# Patient Record
Sex: Male | Born: 1960 | Race: White | Hispanic: No | Smoking: Never smoker
Health system: Southern US, Community
[De-identification: ages and names within clinical notes are randomized; demographics above are authoritative.]

## PROBLEM LIST (undated history)

## (undated) DIAGNOSIS — Z8619 Personal history of other infectious and parasitic diseases: Secondary | ICD-10-CM

## (undated) DIAGNOSIS — I878 Other specified disorders of veins: Secondary | ICD-10-CM

## (undated) DIAGNOSIS — F329 Major depressive disorder, single episode, unspecified: Secondary | ICD-10-CM

## (undated) DIAGNOSIS — E559 Vitamin D deficiency, unspecified: Secondary | ICD-10-CM

## (undated) DIAGNOSIS — K219 Gastro-esophageal reflux disease without esophagitis: Secondary | ICD-10-CM

## (undated) DIAGNOSIS — E1151 Type 2 diabetes mellitus with diabetic peripheral angiopathy without gangrene: Secondary | ICD-10-CM

## (undated) DIAGNOSIS — Z800 Family history of malignant neoplasm of digestive organs: Secondary | ICD-10-CM

## (undated) DIAGNOSIS — F32A Depression, unspecified: Secondary | ICD-10-CM

## (undated) DIAGNOSIS — I10 Essential (primary) hypertension: Secondary | ICD-10-CM

## (undated) DIAGNOSIS — E785 Hyperlipidemia, unspecified: Secondary | ICD-10-CM

## (undated) DIAGNOSIS — Z9109 Other allergy status, other than to drugs and biological substances: Secondary | ICD-10-CM

## (undated) HISTORY — DX: Gastro-esophageal reflux disease without esophagitis: K21.9

## (undated) HISTORY — DX: Depression, unspecified: F32.A

## (undated) HISTORY — DX: Type 2 diabetes mellitus with diabetic peripheral angiopathy without gangrene: E11.51

## (undated) HISTORY — DX: Hyperlipidemia, unspecified: E78.5

## (undated) HISTORY — PX: HERNIA REPAIR: SHX51

## (undated) HISTORY — DX: Personal history of other infectious and parasitic diseases: Z86.19

## (undated) HISTORY — DX: Other specified disorders of veins: I87.8

## (undated) HISTORY — DX: Essential (primary) hypertension: I10

## (undated) HISTORY — DX: Vitamin D deficiency, unspecified: E55.9

## (undated) HISTORY — DX: Other allergy status, other than to drugs and biological substances: Z91.09

## (undated) HISTORY — DX: Family history of malignant neoplasm of digestive organs: Z80.0

## (undated) HISTORY — DX: Major depressive disorder, single episode, unspecified: F32.9

---

## 2000-11-22 HISTORY — PX: CHOLECYSTECTOMY: SHX55

## 2011-06-06 ENCOUNTER — Emergency Department: Payer: Self-pay | Admitting: Emergency Medicine

## 2014-09-24 LAB — TSH: TSH: 0.81 u[IU]/mL (ref 0.41–5.90)

## 2014-09-24 LAB — HEPATIC FUNCTION PANEL
ALK PHOS: 80 U/L (ref 25–125)
ALT: 17 U/L (ref 10–40)
AST: 20 U/L (ref 14–40)
Bilirubin, Total: 0.6 mg/dL

## 2014-09-24 LAB — LIPID PANEL
Cholesterol: 181 mg/dL (ref 0–200)
HDL: 47 mg/dL (ref 35–70)
LDL CALC: 123 mg/dL
LDl/HDL Ratio: 2.6
Triglycerides: 56 mg/dL (ref 40–160)

## 2014-09-24 LAB — CBC AND DIFFERENTIAL
HCT: 43 % (ref 41–53)
Hemoglobin: 14.1 g/dL (ref 13.5–17.5)
Neutrophils Absolute: 4 /uL
Platelets: 263 10*3/uL (ref 150–399)
WBC: 7.2 10*3/mL

## 2014-09-24 LAB — BASIC METABOLIC PANEL
BUN: 23 mg/dL — AB (ref 4–21)
Creatinine: 0.9 mg/dL (ref 0.6–1.3)
Glucose: 91 mg/dL
Potassium: 3.8 mmol/L (ref 3.4–5.3)
Sodium: 142 mmol/L (ref 137–147)

## 2014-09-24 LAB — HEMOGLOBIN A1C: Hgb A1c MFr Bld: 5.9 % (ref 4.0–6.0)

## 2015-03-11 ENCOUNTER — Encounter: Payer: Self-pay | Admitting: Internal Medicine

## 2015-03-11 ENCOUNTER — Encounter (INDEPENDENT_AMBULATORY_CARE_PROVIDER_SITE_OTHER): Payer: Self-pay

## 2015-03-11 ENCOUNTER — Ambulatory Visit (INDEPENDENT_AMBULATORY_CARE_PROVIDER_SITE_OTHER): Payer: 59 | Admitting: Internal Medicine

## 2015-03-11 VITALS — BP 126/78 | HR 62 | Temp 98.4°F | Ht 70.0 in | Wt 261.4 lb

## 2015-03-11 DIAGNOSIS — I83009 Varicose veins of unspecified lower extremity with ulcer of unspecified site: Secondary | ICD-10-CM

## 2015-03-11 DIAGNOSIS — I10 Essential (primary) hypertension: Secondary | ICD-10-CM | POA: Diagnosis not present

## 2015-03-11 DIAGNOSIS — Z800 Family history of malignant neoplasm of digestive organs: Secondary | ICD-10-CM

## 2015-03-11 DIAGNOSIS — L97909 Non-pressure chronic ulcer of unspecified part of unspecified lower leg with unspecified severity: Secondary | ICD-10-CM

## 2015-03-11 DIAGNOSIS — Z1211 Encounter for screening for malignant neoplasm of colon: Secondary | ICD-10-CM | POA: Diagnosis not present

## 2015-03-11 DIAGNOSIS — E559 Vitamin D deficiency, unspecified: Secondary | ICD-10-CM | POA: Diagnosis not present

## 2015-03-11 DIAGNOSIS — K219 Gastro-esophageal reflux disease without esophagitis: Secondary | ICD-10-CM

## 2015-03-11 DIAGNOSIS — E669 Obesity, unspecified: Secondary | ICD-10-CM

## 2015-03-11 DIAGNOSIS — E1351 Other specified diabetes mellitus with diabetic peripheral angiopathy without gangrene: Secondary | ICD-10-CM | POA: Diagnosis not present

## 2015-03-11 NOTE — Progress Notes (Signed)
Pre visit review using our clinic review tool, if applicable. No additional management support is needed unless otherwise documented below in the visit note. 

## 2015-03-13 ENCOUNTER — Encounter: Payer: Self-pay | Admitting: Internal Medicine

## 2015-03-23 ENCOUNTER — Encounter: Payer: Self-pay | Admitting: Internal Medicine

## 2015-03-23 DIAGNOSIS — K219 Gastro-esophageal reflux disease without esophagitis: Secondary | ICD-10-CM | POA: Insufficient documentation

## 2015-03-23 DIAGNOSIS — E1351 Other specified diabetes mellitus with diabetic peripheral angiopathy without gangrene: Secondary | ICD-10-CM | POA: Insufficient documentation

## 2015-03-23 DIAGNOSIS — L97909 Non-pressure chronic ulcer of unspecified part of unspecified lower leg with unspecified severity: Secondary | ICD-10-CM

## 2015-03-23 DIAGNOSIS — Z6839 Body mass index (BMI) 39.0-39.9, adult: Secondary | ICD-10-CM | POA: Insufficient documentation

## 2015-03-23 DIAGNOSIS — Z800 Family history of malignant neoplasm of digestive organs: Secondary | ICD-10-CM | POA: Insufficient documentation

## 2015-03-23 DIAGNOSIS — E559 Vitamin D deficiency, unspecified: Secondary | ICD-10-CM | POA: Insufficient documentation

## 2015-03-23 DIAGNOSIS — Z6841 Body Mass Index (BMI) 40.0 and over, adult: Secondary | ICD-10-CM | POA: Insufficient documentation

## 2015-03-23 DIAGNOSIS — I83009 Varicose veins of unspecified lower extremity with ulcer of unspecified site: Secondary | ICD-10-CM | POA: Insufficient documentation

## 2015-03-23 DIAGNOSIS — I10 Essential (primary) hypertension: Secondary | ICD-10-CM | POA: Insufficient documentation

## 2015-03-23 NOTE — Progress Notes (Signed)
[name], male   DOB: June 25, 1961, 54 y.o.   MRN: [id number]   Subjective:    [name], male    DOB: June 26, 1961, 54 y.o.   MRN: [id number]  HPI  Patient here to establish care.  Has been followed at [facility]. Reports history of increased stress with being a caregiver for his late wife.  She had MS.  She passed away.  In a new relationship now.  Dating Melina Modena.  One year after his wife passed away, he was diagnosed with diabetes.  States he was not taking care of himself.  He has adjusted his diet.  Lost weight.  Off metformin.  On lisinopril for hypertension.  Takes zantac prn.  Takes claritin prn.  Walks.  Works outside.  Has not had colonoscopy.     Past Medical History  Diagnosis Date  . History of chicken pox   . Depression   . Environmental allergies   . Hypertension   . Vitamin D deficiency   . Venous stasis   . Family history of colon cancer   . GERD (gastroesophageal reflux disease)   . 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  HENT: Negative for congestion and sinus pressure.   Respiratory: Negative for cough, chest tightness and shortness of breath.   Cardiovascular: Positive for leg swelling. Negative for chest pain and palpitations.       Has chronic venostasis changes.    Gastrointestinal: Negative for nausea, vomiting, abdominal pain and diarrhea.  Genitourinary: Negative for dysuria and difficulty urinating.  Musculoskeletal: Positive for back pain (has chronic back pain.  ). Negative for joint swelling.  Skin:       Chronic venous stasis changes.    Neurological: Negative for dizziness, light-headedness and headaches.  Hematological: Negative for adenopathy. Does not bruise/bleed easily.  Psychiatric/Behavioral: Negative for dysphoric mood and agitation.       Objective:     Blood pressure  recheck:  130/78  Physical Exam  Constitutional: He appears well-developed and well-nourished. No distress.  HENT:  Nose: Nose normal.  Mouth/Throat: Oropharynx is clear and moist.  Eyes: Right eye exhibits no discharge. Left eye exhibits no discharge. No scleral icterus.  Neck: Neck supple. No thyromegaly present.  Cardiovascular: Normal rate and regular rhythm.   Pulmonary/Chest: Effort normal and breath sounds normal. No respiratory distress.  Abdominal: Soft. Bowel sounds are normal. There is no tenderness.  Musculoskeletal: He exhibits edema. He exhibits no tenderness.  Venous stasis changes.    Lymphadenopathy:    He has no cervical adenopathy.  Skin: Skin is dry. No erythema.  Psychiatric: He has a normal mood and affect. His behavior is normal.    BP 126/78 mmHg  Pulse 62  Temp(Src) 98.4 F (36.9 C) (Oral)  Ht _0  (1.778 m)  Wt 261 lb 6 oz (118.559 kg)  BMI 37.50 kg/m2  SpO2 96% Wt Readings from Last 3 Encounters:  03/11/15 261 lb 6 oz (118.559 kg)     Lab Results  Component Value Date   WBC 7.2 09/24/2014   HGB 14.1 09/24/2014   HCT 43 09/24/2014   PLT 263 09/24/2014   CHOL 181 09/24/2014   TRIG 56 09/24/2014   HDL 47 09/24/2014   LDLCALC 123 09/24/2014   ALT 17 09/24/2014   AST 20 09/24/2014   NA 142 09/24/2014   K  3.8 09/24/2014   CREATININE 0.9 09/24/2014   BUN 23* 09/24/2014   TSH 0.81 09/24/2014   HGBA1C 5.9 09/24/2014       Assessment & Plan:   Problem List Items Addressed This Visit    Essential hypertension    Blood pressure doing well.  Same medication regimen.  Follow pressures.  Follow metabolic panel.        Relevant Medications   lisinopril (PRINIVIL,ZESTRIL) 20 MG tablet   Family history of colon cancer    Has never had colonoscopy.  Refer to GI for colonoscopy.        GERD (gastroesophageal reflux disease)    No significant problem with reflux.  Follow.  Takes zantac prn.        Relevant Medications   ranitidine  (ZANTAC) 150 MG tablet   Obesity    Continue diet and exercise.  Follow.        Secondary diabetes with peripheral circulatory disorder    Not on medication now.  Has lost eight.  Continue diet and exercise.  Follow met b and a1c.        Relevant Medications   lisinopril (PRINIVIL,ZESTRIL) 20 MG tablet   Venous stasis ulcer    Has chronic venous stasis changes lower extremities.  No ulcers now.  Follow.        Vitamin D deficiency    Documented in history.  Check vitamin D level.         Other Visit Diagnoses    Colon cancer screening    -  Primary    Relevant Orders    Ambulatory referral to Gastroenterology      I spent 30 minutes with the patient and more than 50% of the time was spent in consultation regarding the above.     [name], MD

## 2015-03-23 NOTE — Assessment & Plan Note (Signed)
Has never had colonoscopy.  Refer to GI for colonoscopy.

## 2015-03-23 NOTE — Assessment & Plan Note (Signed)
Blood pressure doing well.  Same medication regimen.  Follow pressures.  Follow metabolic panel.   

## 2015-03-23 NOTE — Assessment & Plan Note (Signed)
Continue diet and exercise.  Follow.  

## 2015-03-23 NOTE — Assessment & Plan Note (Signed)
Documented in history.  Check vitamin D level.

## 2015-03-23 NOTE — Assessment & Plan Note (Signed)
No significant problem with reflux.  Follow.  Takes zantac prn.

## 2015-03-23 NOTE — Assessment & Plan Note (Signed)
Not on medication now.  Has lost eight.  Continue diet and exercise.  Follow met b and a1c.   

## 2015-03-23 NOTE — Assessment & Plan Note (Signed)
Has chronic venous stasis changes lower extremities.  No ulcers now.  Follow.

## 2015-03-29 ENCOUNTER — Encounter: Payer: Self-pay | Admitting: Internal Medicine

## 2015-04-09 ENCOUNTER — Telehealth: Payer: Self-pay | Admitting: Internal Medicine

## 2015-04-09 NOTE — Telephone Encounter (Signed)
Pt returned your call to schedule a colonoscopy. Pt request to be called after 3:30/msn

## 2015-05-05 LAB — CBC AND DIFFERENTIAL
HCT: 39 % — AB (ref 41–53)
HEMOGLOBIN: 12.8 g/dL — AB (ref 13.5–17.5)
Neutrophils Absolute: 4 /uL
PLATELETS: 301 10*3/uL (ref 150–399)
WBC: 6.5 10^3/mL

## 2015-05-05 LAB — BASIC METABOLIC PANEL
BUN: 20 mg/dL (ref 4–21)
CREATININE: 0.7 mg/dL (ref 0.6–1.3)
Glucose: 116 mg/dL
POTASSIUM: 3.7 mmol/L (ref 3.4–5.3)
Sodium: 144 mmol/L (ref 137–147)

## 2015-05-05 LAB — HEMOGLOBIN A1C: HEMOGLOBIN A1C: 5.9 % (ref 4.0–6.0)

## 2015-05-05 LAB — HEPATIC FUNCTION PANEL
ALK PHOS: 77 U/L (ref 25–125)
ALT: 20 U/L (ref 10–40)
AST: 20 U/L (ref 14–40)
Bilirubin, Direct: 0.08 mg/dL (ref 0.01–0.4)
Bilirubin, Total: 0.2 mg/dL

## 2015-05-05 LAB — TSH: TSH: 1.29 u[IU]/mL (ref 0.41–5.90)

## 2015-05-07 ENCOUNTER — Encounter: Payer: Self-pay | Admitting: Internal Medicine

## 2015-05-11 ENCOUNTER — Telehealth: Payer: Self-pay | Admitting: Internal Medicine

## 2015-05-11 NOTE — Telephone Encounter (Signed)
Pt notified of lab results via mychart. 

## 2015-05-29 ENCOUNTER — Encounter: Payer: Self-pay | Admitting: Internal Medicine

## 2015-06-23 ENCOUNTER — Telehealth: Payer: Self-pay | Admitting: *Deleted

## 2015-06-23 MED ORDER — LISINOPRIL 20 MG PO TABS: 20.0000 mg | ORAL_TABLET | Freq: Every day | ORAL | Status: DC

## 2015-06-23 NOTE — Telephone Encounter (Signed)
Pt called requesting Lisinopril refill.  Lest OV 4.19.16.  Appears you have never filled Rx before.  Please advise refill

## 2015-06-23 NOTE — Telephone Encounter (Signed)
Sent in rx for lisinopril #30 wiith 3 refills.

## 2015-06-27 ENCOUNTER — Encounter: Payer: Self-pay | Admitting: *Deleted

## 2015-06-27 ENCOUNTER — Encounter
Admission: RE | Disposition: A | Discharge status: Home / Self Care | Payer: Self-pay | Source: Ambulatory Visit | Attending: Gastroenterology

## 2015-06-27 ENCOUNTER — Ambulatory Visit: Payer: 59 | Admitting: Certified Registered Nurse Anesthetist

## 2015-06-27 ENCOUNTER — Ambulatory Visit
Admission: RE | Admit: 2015-06-27 | Discharge: 2015-06-27 | Disposition: A | Discharge status: Home / Self Care | Payer: 59 | Source: Ambulatory Visit | Attending: Gastroenterology | Admitting: Gastroenterology

## 2015-06-27 DIAGNOSIS — F329 Major depressive disorder, single episode, unspecified: Secondary | ICD-10-CM | POA: Diagnosis not present

## 2015-06-27 DIAGNOSIS — E559 Vitamin D deficiency, unspecified: Secondary | ICD-10-CM | POA: Insufficient documentation

## 2015-06-27 DIAGNOSIS — E1151 Type 2 diabetes mellitus with diabetic peripheral angiopathy without gangrene: Secondary | ICD-10-CM | POA: Insufficient documentation

## 2015-06-27 DIAGNOSIS — Z1211 Encounter for screening for malignant neoplasm of colon: Secondary | ICD-10-CM | POA: Insufficient documentation

## 2015-06-27 DIAGNOSIS — E669 Obesity, unspecified: Secondary | ICD-10-CM | POA: Diagnosis not present

## 2015-06-27 DIAGNOSIS — K5730 Diverticulosis of large intestine without perforation or abscess without bleeding: Secondary | ICD-10-CM | POA: Diagnosis not present

## 2015-06-27 DIAGNOSIS — K219 Gastro-esophageal reflux disease without esophagitis: Secondary | ICD-10-CM | POA: Diagnosis not present

## 2015-06-27 DIAGNOSIS — I10 Essential (primary) hypertension: Secondary | ICD-10-CM | POA: Diagnosis not present

## 2015-06-27 DIAGNOSIS — Z79899 Other long term (current) drug therapy: Secondary | ICD-10-CM | POA: Diagnosis not present

## 2015-06-27 DIAGNOSIS — D122 Benign neoplasm of ascending colon: Secondary | ICD-10-CM | POA: Insufficient documentation

## 2015-06-27 DIAGNOSIS — D125 Benign neoplasm of sigmoid colon: Secondary | ICD-10-CM | POA: Insufficient documentation

## 2015-06-27 DIAGNOSIS — Z800 Family history of malignant neoplasm of digestive organs: Secondary | ICD-10-CM | POA: Insufficient documentation

## 2015-06-27 DIAGNOSIS — Z6834 Body mass index (BMI) 34.0-34.9, adult: Secondary | ICD-10-CM | POA: Diagnosis not present

## 2015-06-27 HISTORY — PX: COLONOSCOPY WITH PROPOFOL: SHX5780

## 2015-06-27 LAB — HM COLONOSCOPY

## 2015-06-27 SURGERY — COLONOSCOPY WITH PROPOFOL
Anesthesia: General

## 2015-06-27 MED ORDER — SODIUM CHLORIDE 0.9 % IV SOLN
INTRAVENOUS | Status: DC
Start: 2015-06-27 — End: 2015-06-27

## 2015-06-27 MED ORDER — PROPOFOL 10 MG/ML IV BOLUS
INTRAVENOUS | Status: DC | PRN
  Administered 2015-06-27: 30 mg via INTRAVENOUS
  Administered 2015-06-27: 40 mg via INTRAVENOUS

## 2015-06-27 MED ORDER — SODIUM CHLORIDE 0.9 % IV SOLN
INTRAVENOUS | Status: DC
  Administered 2015-06-27: 13:00:00 via INTRAVENOUS

## 2015-06-27 MED ORDER — LIDOCAINE HCL (CARDIAC) 20 MG/ML IV SOLN
INTRAVENOUS | Status: DC | PRN
  Administered 2015-06-27: 60 mg via INTRAVENOUS

## 2015-06-27 MED ORDER — PROPOFOL INFUSION 10 MG/ML OPTIME
INTRAVENOUS | Status: DC | PRN
  Administered 2015-06-27: 140 ug/kg/min via INTRAVENOUS

## 2015-06-27 NOTE — Anesthesia Procedure Notes (Signed)
Performed by: Elpidio Thielen Pre-anesthesia Checklist: Patient identified, Suction available, Emergency Drugs available, Patient being monitored and Timeout performed Patient Re-evaluated:Patient Re-evaluated prior to inductionOxygen Delivery Method: Nasal cannula Intubation Type: IV induction       

## 2015-06-27 NOTE — Transfer of Care (Signed)
Immediate Anesthesia Transfer of Care Note  Patient: [name]  Procedure(s) Performed: Procedure(s): COLONOSCOPY WITH PROPOFOL (N/A)  Patient Location: PACU  Anesthesia Type:General  Level of Consciousness: awake, alert  and oriented  Airway & Oxygen Therapy: Patient Spontanous Breathing and Patient connected to nasal cannula oxygen  Post-op Assessment: Report given to RN and Post -op Vital signs reviewed and stable  Post vital signs: Reviewed and stable  Last Vitals:  Filed Vitals:   06/27/15 1424  BP: 136/73  Pulse: 68  Temp: 36.3 C  Resp: 13    Complications: No apparent anesthesia complications

## 2015-06-27 NOTE — H&P (Addendum)
Outpatient short stay form Pre-procedure 06/27/2015 1:23 PM Lollie Sails MD  Primary Physician: Dr. [name]  Reason for visit:  Colonoscopy  History of present illness:  Patient is a 54 year old male presenting for his first screening colonoscopy. There is some family history of colon cancer in secondary relatives but no first degree relatives. He tolerated his prep well. He takes no anticoagulation medications or blood thinners such as aspirin.    Current facility-administered medications:  .  0.9 %  sodium chloride infusion, , Intravenous, Continuous, Lollie Sails, MD .  0.9 %  sodium chloride infusion, , Intravenous, Continuous, Lollie Sails, MD  Prescriptions prior to admission  Medication Sig Dispense Refill Last Dose  . lisinopril (PRINIVIL,ZESTRIL) 20 MG tablet Take 1 tablet (20 mg total) by mouth daily. 30 tablet 3 06/26/2015 at 10:30pm  . loratadine (CLARITIN) 10 MG tablet Take 10 mg by mouth daily as needed for allergies.   Past Month at Unknown time  . ranitidine (ZANTAC) 150 MG tablet Take 150 mg by mouth at bedtime as needed for heartburn.   Past Month at Unknown time     No Known Allergies   Past Medical History  Diagnosis Date  . History of chicken pox   . Depression   . Environmental allergies   . Hypertension   . Vitamin D deficiency   . Venous stasis   . Family history of colon cancer   . GERD (gastroesophageal reflux disease)   . 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    Lollie Sails, MD Gastroenterology 06/27/2015  1:23 PM

## 2015-06-27 NOTE — Anesthesia Postprocedure Evaluation (Signed)
  Anesthesia Post-op Note  Patient: [name]  Procedure(s) Performed: Procedure(s): COLONOSCOPY WITH PROPOFOL (N/A)  Anesthesia type:General  Patient location: PACU  Post pain: Pain level controlled  Post assessment: Post-op Vital signs reviewed, Patient's Cardiovascular Status Stable, Respiratory Function Stable, Patent Airway and No signs of Nausea or vomiting  Post vital signs: Reviewed and stable  Last Vitals:  Filed Vitals:   06/27/15 1424  BP: 136/73  Pulse: 68  Temp: 36.3 C  Resp: 13    Level of consciousness: awake, alert  and patient cooperative  Complications: No apparent anesthesia complications

## 2015-06-27 NOTE — Anesthesia Preprocedure Evaluation (Addendum)
Anesthesia Evaluation  Patient identified by MRN, date of birth, ID band Patient awake    Reviewed: Allergy & Precautions, NPO status , Patient's Chart, lab work & pertinent test results  Airway Mallampati: II  TM Distance: >3 FB Neck ROM: Full    Dental  (+) Teeth Intact   Pulmonary    Pulmonary exam normal       Cardiovascular Exercise Tolerance: Good + Peripheral Vascular Disease Normal cardiovascular exam Diet controlled DM--much improved since he lost weight.   Neuro/Psych    GI/Hepatic   Endo/Other  diabetesDiet controlled.  Renal/GU      Musculoskeletal   Abdominal (+) + obese,  Abdomen: soft.    Peds  Hematology   Anesthesia Other Findings   Reproductive/Obstetrics                            Anesthesia Physical Anesthesia Plan  ASA: III  Anesthesia Plan: General   Post-op Pain Management:    Induction: Intravenous  Airway Management Planned: Nasal Cannula  Additional Equipment:   Intra-op Plan:   Post-operative Plan:   Informed Consent: I have reviewed the patients History and Physical, chart, labs and discussed the procedure including the risks, benefits and alternatives for the proposed anesthesia with the patient or authorized representative who has indicated his/her understanding and acceptance.     Plan Discussed with: CRNA  Anesthesia Plan Comments:         Anesthesia Quick Evaluation

## 2015-06-27 NOTE — Op Note (Signed)
[facility] Gastroenterology Patient Name: [name] Procedure Date: 06/27/2015 12:44 PM MRN: [id number] Account #: [id number] Date of Birth: Apr 13, 1961 Admit Type: Outpatient Age: 54 Room: [facility] ENDO ROOM 3 Gender: Male Note Status: Finalized Procedure:         Colonoscopy Indications:       Screening for colorectal malignant neoplasm, This is the                     patient's first colonoscopy Providers:         Lollie Sails, MD Referring MD:      [name], MD (Referring MD) Medicines:         Monitored Anesthesia Care Complications:     No immediate complications. Procedure:         Pre-Anesthesia Assessment:                    - ASA Grade Assessment: III - A patient with severe                     systemic disease.                    After obtaining informed consent, the colonoscope was                     passed under direct vision. Throughout the procedure, the                     patient's blood pressure, pulse, and oxygen saturations                     were monitored continuously. The Colonoscope was                     introduced through the anus and advanced to the the cecum,                     identified by appendiceal orifice and ileocecal valve. The                     colonoscopy was unusually difficult due to poor bowel                     prep, significant looping and a tortuous colon. Successful                     completion of the procedure was aided by changing the                     patient to a supine position, changing the patient to a                     prone position, using manual pressure, applying abdominal                     pressure and lavage. The patient tolerated the procedure                     well. The quality of the bowel preparation was fair except                     the cecum was poor. Findings:      A few small-mouthed diverticula were found in the sigmoid colon.  Two sessile polyps were found in  the proximal ascending colon. The       polyps were 2 to 3 mm in size. These polyps were removed with a cold       biopsy forceps. Resection and retrieval were complete.      The cecum could not be fully intubated, but otherwise normal from a       distance.      A 1 mm polyp was found in the sigmoid colon. The polyp was flat. The       polyp was removed with a cold biopsy forceps. Resection and retrieval       were complete.      The retroflexed view of the distal rectum and anal verge was normal and       showed no anal or rectal abnormalities.      The digital rectal exam was normal. Impression:        - Diverticulosis in the sigmoid colon.                    - Two 2 to 3 mm polyps in the proximal ascending colon.                     Resected and retrieved.                    - One 1 mm polyp in the sigmoid colon. Resected and                     retrieved.                    - The distal rectum and anal verge are normal on                     retroflexion view. Recommendation:    - Await pathology results.                    - Telephone GI clinic for pathology results in 1 week. Procedure Code(s): --- Professional ---                    [phone], Colonoscopy, flexible; with biopsy, single or                     multiple Diagnosis Code(s): --- Professional ---                    V76.51, Special screening for malignant neoplasms of colon                    211.3, Benign neoplasm of colon                    562.10, Diverticulosis of colon (without mention of                     hemorrhage) CPT copyright 2014 American Medical Association. All rights reserved. The codes documented in this report are preliminary and upon coder review may  be revised to meet current compliance requirements. Lollie Sails, MD 06/27/2015 2:23:51 PM This report has been signed electronically. Number of Addenda: 0 Note Initiated On: 06/27/2015 12:44 PM Scope Withdrawal Time: 0 hours 23 minutes 45 seconds   Total Procedure Duration: 0 hours 38 minutes 24 seconds       [facility]

## 2015-06-28 ENCOUNTER — Encounter: Payer: Self-pay | Admitting: Gastroenterology

## 2015-07-01 ENCOUNTER — Encounter: Payer: Self-pay | Admitting: Internal Medicine

## 2015-07-01 DIAGNOSIS — Z860100 Personal history of colon polyps, unspecified: Secondary | ICD-10-CM | POA: Insufficient documentation

## 2015-07-01 LAB — SURGICAL PATHOLOGY

## 2015-07-07 ENCOUNTER — Encounter: Payer: Self-pay | Admitting: Internal Medicine

## 2015-09-11 ENCOUNTER — Ambulatory Visit (INDEPENDENT_AMBULATORY_CARE_PROVIDER_SITE_OTHER): Payer: 59 | Admitting: Internal Medicine

## 2015-09-11 ENCOUNTER — Encounter: Payer: Self-pay | Admitting: Internal Medicine

## 2015-09-11 VITALS — BP 120/80 | HR 71 | Temp 98.4°F | Resp 18 | Ht 70.25 in | Wt 272.5 lb

## 2015-09-11 DIAGNOSIS — E559 Vitamin D deficiency, unspecified: Secondary | ICD-10-CM

## 2015-09-11 DIAGNOSIS — M545 Low back pain, unspecified: Secondary | ICD-10-CM

## 2015-09-11 DIAGNOSIS — Z86010 Personal history of colonic polyps: Secondary | ICD-10-CM

## 2015-09-11 DIAGNOSIS — R1032 Left lower quadrant pain: Secondary | ICD-10-CM

## 2015-09-11 DIAGNOSIS — Z800 Family history of malignant neoplasm of digestive organs: Secondary | ICD-10-CM

## 2015-09-11 DIAGNOSIS — I10 Essential (primary) hypertension: Secondary | ICD-10-CM

## 2015-09-11 DIAGNOSIS — E1351 Other specified diabetes mellitus with diabetic peripheral angiopathy without gangrene: Secondary | ICD-10-CM

## 2015-09-11 DIAGNOSIS — Z0000 Encounter for general adult medical examination without abnormal findings: Secondary | ICD-10-CM

## 2015-09-11 DIAGNOSIS — E669 Obesity, unspecified: Secondary | ICD-10-CM

## 2015-09-11 DIAGNOSIS — K219 Gastro-esophageal reflux disease without esophagitis: Secondary | ICD-10-CM | POA: Diagnosis not present

## 2015-09-11 DIAGNOSIS — Z23 Encounter for immunization: Secondary | ICD-10-CM | POA: Diagnosis not present

## 2015-09-11 MED ORDER — LISINOPRIL 20 MG PO TABS: 20.0000 mg | ORAL_TABLET | Freq: Every day | ORAL | Status: DC

## 2015-09-11 NOTE — Progress Notes (Signed)
Pre-visit discussion using our clinic review tool. No additional management support is needed unless otherwise documented below in the visit note.  

## 2015-09-11 NOTE — Patient Instructions (Signed)

## 2015-09-11 NOTE — Progress Notes (Signed)
[name], male   DOB: [birth year], 54 y.o.   MRN: [id number]   Subjective:    [name], male    DOB: 26-Oct-1961, 54 y.o.   MRN: [id number]  HPI  Patient with past history of hypertension, venous stasis, GERD and diabetes.  He comes in today to follow up on these issues as well as for a complete physical exam.  Had a recent colonoscopy with polyps removed.  No chest pain or tightness.  Breathing stable.  No increased allergy symptoms.  Does report a feeling that he describes as a pulling sensation - in his upper leg and groin.  Sitting - ok.  Lying ok.  Intermittent.  No urine or bowel change.  We discussed diet and exercise.  Discussed diet adjustment.  Drinks diet Dr Malachi Bonds (2-3 per day).  Also drinks tea.  Discussed the need for water i   Past Medical History  Diagnosis Date  . History of chicken pox   . Depression   . Environmental allergies   . Hypertension   . Vitamin D deficiency   . Venous stasis   . Family history of colon cancer   . GERD (gastroesophageal reflux disease)   . 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 Cholecystectomy  2002  . Hernia repair  1962  . Colonoscopy with propofol N/A 06/27/2015    Procedure: COLONOSCOPY WITH PROPOFOL;  Surgeon: Lollie Sails, MD;  Location: [facility] ENDOSCOPY;  Service: Endoscopy;  Laterality: N/A;   Family History  Problem Relation Age of Onset  . Stomach cancer Mother   . Arthritis Mother   . Diabetes Mother   . Hypertension Mother   . Hyperlipidemia Father   . Heart disease Father   . Diabetes Father   . Hypertension Father   . Diabetes Sister   . Arthritis Maternal Grandmother   . Hyperlipidemia Maternal Grandmother   . Hypertension Maternal Grandmother   . Arthritis Maternal Grandfather   . Hyperlipidemia Maternal Grandfather   . Heart disease  Maternal Grandfather   . Hypertension Maternal Grandfather   . Arthritis Paternal Grandmother   . Breast cancer Paternal Grandmother   . Hypertension Paternal Grandmother   . Arthritis Paternal Grandfather   . Hyperlipidemia Paternal Grandfather   . Heart disease Paternal Grandfather   . Diabetes Paternal Grandfather   . Hypertension Paternal Grandfather    Social History   Social History  . Marital Status: Widowed    Spouse Name: N/A  . Number of Children: N/A  . Years of Education: N/A   Social History Main Topics  . Smoking status: Never Smoker   . Smokeless tobacco: Never Used  . Alcohol Use: 0.0 oz/week    0 Standard drinks or equivalent per week     Comment: 1 drink every 2 months  . Drug Use: No  . Sexual Activity: Not Asked   Other Topics Concern  . None   Social History Narrative    Outpatient Encounter Prescriptions as of 09/11/2015  Medication Sig  . lisinopril (PRINIVIL,ZESTRIL) 20 MG tablet Take 1 tablet (20 mg total) by mouth daily.  Marland Kitchen loratadine (CLARITIN) 10 MG tablet Take 10 mg by mouth daily as needed for allergies.  . ranitidine (ZANTAC) 150 MG tablet Take 150 mg by mouth at bedtime as needed for heartburn.  . [  DISCONTINUED] lisinopril (PRINIVIL,ZESTRIL) 20 MG tablet Take 1 tablet (20 mg total) by mouth daily.   No facility-administered encounter medications on file as of 09/11/2015.    Review of Systems  Constitutional: Negative for fatigue and unexpected weight change.  HENT: Negative for congestion and sinus pressure.   Eyes: Negative for pain and visual disturbance.  Respiratory: Negative for cough, chest tightness and shortness of breath.   Cardiovascular: Negative for chest pain and palpitations.  Gastrointestinal: Negative for nausea, vomiting, abdominal pain and diarrhea.  Genitourinary: Negative for dysuria and difficulty urinating.  Musculoskeletal: Negative for back pain and joint swelling.       Pain into his groin as outlined.      Skin: Negative for color change and rash.  Neurological: Negative for dizziness, light-headedness and headaches.  Hematological: Negative for adenopathy. Does not bruise/bleed easily.  Psychiatric/Behavioral: Negative for dysphoric mood and agitation.       Objective:    Physical Exam  Constitutional: He is oriented to person, place, and time. He appears well-developed and well-nourished. No distress.  HENT:  Head: Normocephalic and atraumatic.  Nose: Nose normal.  Mouth/Throat: Oropharynx is clear and moist. No oropharyngeal exudate.  Eyes: Conjunctivae are normal. Right eye exhibits no discharge. Left eye exhibits no discharge.  Neck: Neck supple. No thyromegaly present.  Cardiovascular: Normal rate and regular rhythm.   Pulmonary/Chest: Effort normal and breath sounds normal. No respiratory distress. He has no wheezes.  Abdominal: Soft. Bowel sounds are normal. There is no tenderness.  Genitourinary:  Normal descended testicles.  No nodules appreciated.  No tenderness.  No pain to palpation over the groin.  Rectal exam - no palpable prostate nodules.  Heme negative.    Musculoskeletal: He exhibits no edema or tenderness.  Lymphadenopathy:    He has no cervical adenopathy.  Neurological: He is alert and oriented to person, place, and time.  Skin: Skin is warm and dry. No rash noted. No erythema.  Psychiatric: He has a normal mood and affect. His behavior is normal.    BP 120/80 mmHg  Pulse 71  Temp(Src) 98.4 F (36.9 C) (Oral)  Resp 18  Ht 5' 10.25" (1.784 m)  Wt 272 lb 8 oz (123.605 kg)  BMI 38.84 kg/m2  SpO2 95% Wt Readings from Last 3 Encounters:  09/11/15 272 lb 8 oz (123.605 kg)  06/27/15 262 lb (118.842 kg)  03/11/15 261 lb 6 oz (118.559 kg)     Lab Results  Component Value Date   WBC 6.5 05/05/2015   HGB 12.8* 05/05/2015   HCT 39* 05/05/2015   PLT 301 05/05/2015   CHOL 181 09/24/2014   TRIG 56 09/24/2014   HDL 47 09/24/2014   LDLCALC 123 09/24/2014    ALT 20 05/05/2015   AST 20 05/05/2015   NA 144 05/05/2015   K 3.7 05/05/2015   CREATININE 0.7 05/05/2015   BUN 20 05/05/2015   TSH 1.29 05/05/2015   HGBA1C 5.9 05/05/2015       Assessment & Plan:   Problem List Items Addressed This Visit    Essential hypertension    Blood pressure under good control.  Continue same medication regimen.  Follow pressures.  Follow metabolic panel.        Relevant Medications   lisinopril (PRINIVIL,ZESTRIL) 20 MG tablet   Family history of colon cancer    Colonoscopy 06/27/15 with tubular adenoma x 2 and hyperplastic polyp removed.  Diverticulosis.        GERD (gastroesophageal reflux disease)  Takes zantac prn.  Follow.  No upper symptoms.        Groin pain    No pain reproducible on exam.  Stretches.  Question if some of his pain is coming from his back.  Check L-S spine xray.  Follow.        History of colonic polyps    Colonoscopy 06/27/15 as outlined.  Recommended f/u colonoscopy in five years.        Obesity    Discussed diet and exercise.  Follow.  Discussed stopping the increased sodas.  Drinking more water.        Secondary diabetes with peripheral circulatory disorder (HCC)    On no medication.  Lost weight.  Continue diet and exercise.  Follow met b and a1c.  Discussed diet and exercise.        Relevant Medications   lisinopril (PRINIVIL,ZESTRIL) 20 MG tablet   Vitamin D deficiency    Vitamin D level checked 04/2015 28.  Vitamin D supplements.  Follow.         Other Visit Diagnoses    Midline low back pain without sciatica    -  Primary    Relevant Orders    DG Lumbar Spine 2-3 Views    Routine general medical examination at a health care facility            [name], MD

## 2015-09-14 ENCOUNTER — Encounter: Payer: Self-pay | Admitting: Internal Medicine

## 2015-09-14 DIAGNOSIS — R1030 Lower abdominal pain, unspecified: Secondary | ICD-10-CM | POA: Insufficient documentation

## 2015-09-14 NOTE — Assessment & Plan Note (Signed)
No pain reproducible on exam.  Stretches.  Question if some of his pain is coming from his back.  Check L-S spine xray.  Follow.

## 2015-09-14 NOTE — Assessment & Plan Note (Signed)
Colonoscopy 06/27/15 as outlined.  Recommended f/u colonoscopy in five years.

## 2015-09-14 NOTE — Assessment & Plan Note (Signed)
Colonoscopy 06/27/15 with tubular adenoma x 2 and hyperplastic polyp removed.  Diverticulosis.

## 2015-09-14 NOTE — Assessment & Plan Note (Signed)
Discussed diet and exercise.  Follow.  Discussed stopping the increased sodas.  Drinking more water.

## 2015-09-14 NOTE — Assessment & Plan Note (Signed)
Vitamin D level checked 04/2015 28.  Vitamin D supplements.  Follow.

## 2015-09-14 NOTE — Assessment & Plan Note (Signed)
Blood pressure under good control.  Continue same medication regimen.  Follow pressures.  Follow metabolic panel.   

## 2015-09-14 NOTE — Assessment & Plan Note (Signed)
On no medication.  Lost weight.  Continue diet and exercise.  Follow met b and a1c.  Discussed diet and exercise.

## 2015-09-14 NOTE — Assessment & Plan Note (Signed)
Takes zantac prn.  Follow.  No upper symptoms.

## 2015-09-16 ENCOUNTER — Ambulatory Visit
Admission: RE | Admit: 2015-09-16 | Discharge: 2015-09-16 | Disposition: A | Discharge status: Home / Self Care | Payer: 59 | Source: Ambulatory Visit | Attending: Internal Medicine | Admitting: Internal Medicine

## 2015-09-16 DIAGNOSIS — M545 Low back pain, unspecified: Secondary | ICD-10-CM

## 2015-09-16 DIAGNOSIS — M47894 Other spondylosis, thoracic region: Secondary | ICD-10-CM | POA: Diagnosis not present

## 2015-09-16 DIAGNOSIS — M47896 Other spondylosis, lumbar region: Secondary | ICD-10-CM | POA: Diagnosis not present

## 2015-09-20 ENCOUNTER — Other Ambulatory Visit: Payer: Self-pay | Admitting: Internal Medicine

## 2015-09-22 LAB — HEPATIC FUNCTION PANEL
ALK PHOS: 78 IU/L (ref 39–117)
ALT: 17 IU/L (ref 0–44)
AST: 17 IU/L (ref 0–40)
Albumin: 3.8 g/dL (ref 3.5–5.5)
Bilirubin Total: 0.3 mg/dL (ref 0.0–1.2)
Bilirubin, Direct: 0.09 mg/dL (ref 0.00–0.40)
Total Protein: 6.9 g/dL (ref 6.0–8.5)

## 2015-09-22 LAB — BASIC METABOLIC PANEL
BUN/Creatinine Ratio: 27 — ABNORMAL HIGH (ref 9–20)
BUN: 22 mg/dL (ref 6–24)
CALCIUM: 9.1 mg/dL (ref 8.7–10.2)
CHLORIDE: 100 mmol/L (ref 97–106)
CO2: 25 mmol/L (ref 18–29)
Creatinine, Ser: 0.82 mg/dL (ref 0.76–1.27)
GFR calc Af Amer: 116 mL/min/{1.73_m2} (ref >59)
GFR calc non Af Amer: 100 mL/min/{1.73_m2} (ref >59)
GLUCOSE: 115 mg/dL — AB (ref 65–99)
POTASSIUM: 3.7 mmol/L (ref 3.5–5.2)
Sodium: 141 mmol/L (ref 136–144)

## 2015-09-22 LAB — MICROALBUMIN / CREATININE URINE RATIO
CREATININE, UR: 52.2 mg/dL
MICROALB/CREAT RATIO: 16.1 mg/g creat (ref 0.0–30.0)
Microalbumin, Urine: 8.4 ug/mL

## 2015-09-22 LAB — LIPID PANEL W/O CHOL/HDL RATIO
Cholesterol, Total: 175 mg/dL (ref 100–199)
HDL: 44 mg/dL (ref >39)
LDL Calculated: 120 mg/dL — ABNORMAL HIGH (ref 0–99)
TRIGLYCERIDES: 57 mg/dL (ref 0–149)
VLDL Cholesterol Cal: 11 mg/dL (ref 5–40)

## 2015-09-22 LAB — FERRITIN: FERRITIN: 86 ng/mL (ref 30–400)

## 2015-09-22 LAB — CBC
Hematocrit: 40.8 % (ref 37.5–51.0)
Hemoglobin: 13.5 g/dL (ref 12.6–17.7)
MCH: 28.4 pg (ref 26.6–33.0)
MCHC: 33.1 g/dL (ref 31.5–35.7)
MCV: 86 fL (ref 79–97)
PLATELETS: 262 10*3/uL (ref 150–379)
RBC: 4.76 x10E6/uL (ref 4.14–5.80)
RDW: 14.2 % (ref 12.3–15.4)
WBC: 6.8 10*3/uL (ref 3.4–10.8)

## 2015-09-22 LAB — VITAMIN B12: Vitamin B-12: 354 pg/mL (ref 211–946)

## 2015-09-22 LAB — HGB A1C W/O EAG: Hgb A1c MFr Bld: 5.9 % — ABNORMAL HIGH (ref 4.8–5.6)

## 2015-09-23 ENCOUNTER — Encounter: Payer: Self-pay | Admitting: Internal Medicine

## 2015-09-29 ENCOUNTER — Encounter: Payer: Self-pay | Admitting: Internal Medicine

## 2015-10-01 NOTE — Telephone Encounter (Signed)
Unread mychart message mailed to patient 

## 2015-11-18 ENCOUNTER — Ambulatory Visit (INDEPENDENT_AMBULATORY_CARE_PROVIDER_SITE_OTHER): Payer: 59 | Admitting: Internal Medicine

## 2015-11-18 ENCOUNTER — Encounter: Payer: Self-pay | Admitting: Internal Medicine

## 2015-11-18 VITALS — BP 134/75 | HR 71 | Temp 98.4°F | Ht 70.0 in | Wt 274.5 lb

## 2015-11-18 DIAGNOSIS — I10 Essential (primary) hypertension: Secondary | ICD-10-CM | POA: Diagnosis not present

## 2015-11-18 DIAGNOSIS — E1351 Other specified diabetes mellitus with diabetic peripheral angiopathy without gangrene: Secondary | ICD-10-CM | POA: Diagnosis not present

## 2015-11-18 DIAGNOSIS — K219 Gastro-esophageal reflux disease without esophagitis: Secondary | ICD-10-CM | POA: Diagnosis not present

## 2015-11-18 DIAGNOSIS — Z86010 Personal history of colonic polyps: Secondary | ICD-10-CM

## 2015-11-18 DIAGNOSIS — I83009 Varicose veins of unspecified lower extremity with ulcer of unspecified site: Secondary | ICD-10-CM | POA: Diagnosis not present

## 2015-11-18 DIAGNOSIS — L97909 Non-pressure chronic ulcer of unspecified part of unspecified lower leg with unspecified severity: Secondary | ICD-10-CM

## 2015-11-18 DIAGNOSIS — R1032 Left lower quadrant pain: Secondary | ICD-10-CM

## 2015-11-18 DIAGNOSIS — E559 Vitamin D deficiency, unspecified: Secondary | ICD-10-CM

## 2015-11-18 DIAGNOSIS — E669 Obesity, unspecified: Secondary | ICD-10-CM

## 2015-11-18 NOTE — Progress Notes (Signed)
Pre visit review using our clinic review tool, if applicable. No additional management support is needed unless otherwise documented below in the visit note. 

## 2015-11-21 ENCOUNTER — Encounter: Payer: Self-pay | Admitting: Internal Medicine

## 2015-11-21 NOTE — Assessment & Plan Note (Signed)
Doing better on decreased zantac.  Follow.

## 2015-11-21 NOTE — Assessment & Plan Note (Signed)
On no medication.  Discussed diet and exercise.  Follow met b and a1c.

## 2015-11-21 NOTE — Assessment & Plan Note (Signed)
Colonoscopy 06/27/15 - as outlined in overview.  Recommended f/u colonoscopy in five years.

## 2015-11-21 NOTE — Assessment & Plan Note (Signed)
Continue vitamin D supplements.  

## 2015-11-21 NOTE — Assessment & Plan Note (Signed)
Stable venous stasis changes - lower extremities.  Discussed compression hose.  Follow.

## 2015-11-21 NOTE — Assessment & Plan Note (Signed)
Resolved.  Continue stretches and exercises.

## 2015-11-21 NOTE — Progress Notes (Signed)
[name], male   DOB: 11-30-1960, 54 y.o.   MRN: [id number]   Subjective:    [name], male    DOB: 01/04/1961, 54 y.o.   MRN: [id number]  HPI  Patient with past history diabetes, GERD and hypertension.  He comes in today to follow up on these issues.  His pain he was experiencing on last visit is better.  Has seen chiropractor.  Helped.  Does exercises.  No chest pain or tightness.  Breathing stable.  No acid reflux.  Has decreased zantac.  Doing well with this.  No abdominal pain or cramping.  Bowels stable.  Handling stress.  Overall feels better.  Discussed diet and exercise.     Past Medical History  Diagnosis Date  . History of chicken pox   . Depression   . Environmental allergies   . Hypertension   . Vitamin D deficiency   . Venous stasis   . Family history of colon cancer   . GERD (gastroesophageal reflux disease)   . 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 Cholecystectomy  2002  . Hernia repair  1962  . Colonoscopy with propofol N/A 06/27/2015    Procedure: COLONOSCOPY WITH PROPOFOL;  Surgeon: Lollie Sails, MD;  Location: [facility] ENDOSCOPY;  Service: Endoscopy;  Laterality: N/A;   Family History  Problem Relation Age of Onset  . Stomach cancer Mother   . Arthritis Mother   . Diabetes Mother   . Hypertension Mother   . Hyperlipidemia Father   . Heart disease Father   . Diabetes Father   . Hypertension Father   . Diabetes Sister   . Arthritis Maternal Grandmother   . Hyperlipidemia Maternal Grandmother   . Hypertension Maternal Grandmother   . Arthritis Maternal Grandfather   . Hyperlipidemia Maternal Grandfather   . Heart disease Maternal Grandfather   . Hypertension Maternal Grandfather   . Arthritis Paternal Grandmother   . Breast cancer Paternal Grandmother   . Hypertension Paternal  Grandmother   . Arthritis Paternal Grandfather   . Hyperlipidemia Paternal Grandfather   . Heart disease Paternal Grandfather   . Diabetes Paternal Grandfather   . Hypertension Paternal Grandfather    Social History   Social History  . Marital Status: Widowed    Spouse Name: N/A  . Number of Children: N/A  . Years of Education: N/A   Social History Main Topics  . Smoking status: Never Smoker   . Smokeless tobacco: Never Used  . Alcohol Use: 0.0 oz/week    0 Standard drinks or equivalent per week     Comment: 1 drink every 2 months  . Drug Use: No  . Sexual Activity: Not Asked   Other Topics Concern  . None   Social History Narrative    Outpatient Encounter Prescriptions as of 11/18/2015  Medication Sig  . lisinopril (PRINIVIL,ZESTRIL) 20 MG tablet Take 1 tablet (20 mg total) by mouth daily.  Marland Kitchen loratadine (CLARITIN) 10 MG tablet Take 10 mg by mouth daily as needed for allergies.  . ranitidine (ZANTAC) 150 MG tablet Take 150 mg by mouth at bedtime as needed for heartburn.   No facility-administered encounter medications on file as of 11/18/2015.    Review of Systems  Constitutional: Negative for appetite change and unexpected weight change.  HENT: Negative for congestion and  sinus pressure.   Eyes: Negative for discharge and redness.  Respiratory: Negative for cough, chest tightness and shortness of breath.   Cardiovascular: Negative for chest pain, palpitations and leg swelling (no increased swelling.).  Gastrointestinal: Negative for nausea, vomiting, abdominal pain and diarrhea.  Genitourinary: Negative for dysuria and difficulty urinating.  Musculoskeletal: Negative for back pain and joint swelling.  Skin: Negative for color change and rash.  Neurological: Negative for dizziness, light-headedness and headaches.  Psychiatric/Behavioral: Negative for dysphoric mood and agitation.       Objective:    Physical Exam  Constitutional: He appears well-developed and  well-nourished. No distress.  HENT:  Nose: Nose normal.  Mouth/Throat: Oropharynx is clear and moist.  Eyes: Conjunctivae are normal. Right eye exhibits no discharge. Left eye exhibits no discharge.  Neck: Neck supple. No thyromegaly present.  Cardiovascular: Normal rate and regular rhythm.   Pulmonary/Chest: Effort normal and breath sounds normal. No respiratory distress.  Abdominal: Soft. Bowel sounds are normal. There is no tenderness.  Musculoskeletal: He exhibits no edema or tenderness.  Lymphadenopathy:    He has no cervical adenopathy.  Skin: No rash noted. No erythema.  Psychiatric: He has a normal mood and affect. His behavior is normal.    BP 134/75 mmHg  Pulse 71  Temp(Src) 98.4 F (36.9 C) (Oral)  Ht _0  (1.778 m)  Wt 274 lb 8 oz (124.512 kg)  BMI 39.39 kg/m2  SpO2 94% Wt Readings from Last 3 Encounters:  11/18/15 274 lb 8 oz (124.512 kg)  09/11/15 272 lb 8 oz (123.605 kg)  06/27/15 262 lb (118.842 kg)     Lab Results  Component Value Date   WBC 6.8 09/20/2015   HGB 12.8* 05/05/2015   HCT 40.8 09/20/2015   PLT 301 05/05/2015   GLUCOSE 115* 09/20/2015   CHOL 175 09/20/2015   TRIG 57 09/20/2015   HDL 44 09/20/2015   LDLCALC 120* 09/20/2015   ALT 17 09/20/2015   AST 17 09/20/2015   NA 141 09/20/2015   K 3.7 09/20/2015   CL 100 09/20/2015   CREATININE 0.82 09/20/2015   BUN 22 09/20/2015   CO2 25 09/20/2015   TSH 1.29 05/05/2015   HGBA1C 5.9* 09/20/2015    Dg Lumbar Spine 2-3 Views  09/17/2015  CLINICAL DATA:  Lower back pain. EXAM: LUMBAR SPINE - 2-3 VIEW COMPARISON:  None. FINDINGS: Multilevel spondylosis noted throughout the visualized spine. Moderate proliferative changes are present at T12-L1 and L1-L2. There is suggestion of a minimal retrolisthesis of L2 on L3 of approximately 4-5 mm. Mild proliferative changes are present at L3-4. Moderate facet hypertrophy present at L4-5 and L5-S1. No acute fracture identified. No bony lesions are seen.  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 There is mild retrolisthesis of L2 on L3 by approximately 4-5 mm. Electronically Signed   By: Aletta Edouard M.D.   On: 09/17/2015 09:02       Assessment & Plan:   Problem List Items Addressed This Visit    Essential hypertension - Primary    Blood pressure under good control.  Continue same medication regimen.  Follow pressures.  Follow metabolic panel.        GERD (gastroesophageal reflux disease)    Doing better on decreased zantac.  Follow.        Groin pain    Resolved.  Continue stretches and exercises.        History of colonic polyps    Colonoscopy 06/27/15 -  as outlined in overview.  Recommended f/u colonoscopy in five years.        Obesity    Diet and exercise.        Secondary diabetes with peripheral circulatory disorder (HCC)    On no medication.  Discussed diet and exercise.  Follow met b and a1c.        Venous stasis ulcer (HCC)    Stable venous stasis changes - lower extremities.  Discussed compression hose.  Follow.        Vitamin D deficiency    Continue vitamin D supplements.            [name], MD

## 2015-11-21 NOTE — Assessment & Plan Note (Signed)
Blood pressure under good control.  Continue same medication regimen.  Follow pressures.  Follow metabolic panel.   

## 2015-11-21 NOTE — Assessment & Plan Note (Signed)
Diet and exercise.   

## 2016-02-07 ENCOUNTER — Other Ambulatory Visit: Payer: Self-pay | Admitting: Internal Medicine

## 2016-02-09 LAB — LIPID PANEL W/O CHOL/HDL RATIO
CHOLESTEROL TOTAL: 180 mg/dL (ref 100–199)
HDL: 44 mg/dL (ref >39)
LDL Calculated: 123 mg/dL — ABNORMAL HIGH (ref 0–99)
TRIGLYCERIDES: 64 mg/dL (ref 0–149)
VLDL Cholesterol Cal: 13 mg/dL (ref 5–40)

## 2016-02-09 LAB — PSA: PROSTATE SPECIFIC AG, SERUM: 0.7 ng/mL (ref 0.0–4.0)

## 2016-02-09 LAB — BASIC METABOLIC PANEL
BUN/Creatinine Ratio: 34 — ABNORMAL HIGH (ref 9–20)
BUN: 24 mg/dL (ref 6–24)
CO2: 25 mmol/L (ref 18–29)
Calcium: 9 mg/dL (ref 8.7–10.2)
Chloride: 101 mmol/L (ref 96–106)
Creatinine, Ser: 0.71 mg/dL — ABNORMAL LOW (ref 0.76–1.27)
GFR calc Af Amer: 123 mL/min/{1.73_m2} (ref >59)
GFR calc non Af Amer: 106 mL/min/{1.73_m2} (ref >59)
GLUCOSE: 111 mg/dL — AB (ref 65–99)
Potassium: 3.7 mmol/L (ref 3.5–5.2)
SODIUM: 140 mmol/L (ref 134–144)

## 2016-02-09 LAB — HEPATIC FUNCTION PANEL
ALT: 17 IU/L (ref 0–44)
AST: 16 IU/L (ref 0–40)
Albumin: 4 g/dL (ref 3.5–5.5)
Alkaline Phosphatase: 71 IU/L (ref 39–117)
BILIRUBIN, DIRECT: 0.09 mg/dL (ref 0.00–0.40)
Bilirubin Total: 0.3 mg/dL (ref 0.0–1.2)
Total Protein: 6.9 g/dL (ref 6.0–8.5)

## 2016-02-09 LAB — HGB A1C W/O EAG: Hgb A1c MFr Bld: 5.8 % — ABNORMAL HIGH (ref 4.8–5.6)

## 2016-02-11 ENCOUNTER — Encounter: Payer: Self-pay | Admitting: Internal Medicine

## 2016-02-16 ENCOUNTER — Ambulatory Visit: Payer: 59 | Admitting: Internal Medicine

## 2016-02-23 ENCOUNTER — Ambulatory Visit (INDEPENDENT_AMBULATORY_CARE_PROVIDER_SITE_OTHER): Payer: BLUE CROSS/BLUE SHIELD | Admitting: Internal Medicine

## 2016-02-23 ENCOUNTER — Ambulatory Visit: Payer: Self-pay | Admitting: Internal Medicine

## 2016-02-23 ENCOUNTER — Encounter: Payer: Self-pay | Admitting: Internal Medicine

## 2016-02-23 VITALS — BP 122/80 | HR 72 | Temp 98.5°F | Resp 18 | Ht 70.0 in | Wt 271.5 lb

## 2016-02-23 DIAGNOSIS — Z860100 Personal history of colon polyps, unspecified: Secondary | ICD-10-CM

## 2016-02-23 DIAGNOSIS — I10 Essential (primary) hypertension: Secondary | ICD-10-CM | POA: Diagnosis not present

## 2016-02-23 DIAGNOSIS — I83009 Varicose veins of unspecified lower extremity with ulcer of unspecified site: Secondary | ICD-10-CM

## 2016-02-23 DIAGNOSIS — E669 Obesity, unspecified: Secondary | ICD-10-CM

## 2016-02-23 DIAGNOSIS — E1351 Other specified diabetes mellitus with diabetic peripheral angiopathy without gangrene: Secondary | ICD-10-CM | POA: Diagnosis not present

## 2016-02-23 DIAGNOSIS — K219 Gastro-esophageal reflux disease without esophagitis: Secondary | ICD-10-CM

## 2016-02-23 DIAGNOSIS — E7800 Pure hypercholesterolemia, unspecified: Secondary | ICD-10-CM

## 2016-02-23 DIAGNOSIS — L97909 Non-pressure chronic ulcer of unspecified part of unspecified lower leg with unspecified severity: Secondary | ICD-10-CM

## 2016-02-23 MED ORDER — TRIAMCINOLONE ACETONIDE 0.1 % EX CREA: 1.0000 "application " | TOPICAL_CREAM | Freq: Two times a day (BID) | CUTANEOUS | Status: DC

## 2016-02-23 NOTE — Progress Notes (Signed)
Pre-visit discussion using our clinic review tool. No additional management support is needed unless otherwise documented below in the visit note.  

## 2016-02-23 NOTE — Progress Notes (Signed)
[name], male   DOB: [birth year], 55 y.o.   MRN: [id number]   Subjective:    [name], male    DOB: [birth year], 55 y.o.   MRN: [id number]  HPI  Patient here for a scheduled follow up.  He reports he is doing well.  No back pain.  Breathing stable.  Discussed diet and exercise.  No chest pain.  No tightness.  No sob.  No acid reflux.  No abdominal pain or cramping.  Bowels stable.  He reports rash lower extremity.  Small patch.  Itches.     Past Medical History  Diagnosis Date  . History of chicken pox   . Depression   . Environmental allergies   . Hypertension   . Vitamin D deficiency   . Venous stasis   . Family history of colon cancer   . GERD (gastroesophageal reflux disease)   . 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 Cholecystectomy  2002  . Hernia repair  1962  . Colonoscopy with propofol N/A 06/27/2015    Procedure: COLONOSCOPY WITH PROPOFOL;  Surgeon: Lollie Sails, MD;  Location: [facility] ENDOSCOPY;  Service: Endoscopy;  Laterality: N/A;   Family History  Problem Relation Age of Onset  . Stomach cancer Mother   . Arthritis Mother   . Diabetes Mother   . Hypertension Mother   . Hyperlipidemia Father   . Heart disease Father   . Diabetes Father   . Hypertension Father   . Diabetes Sister   . Arthritis Maternal Grandmother   . Hyperlipidemia Maternal Grandmother   . Hypertension Maternal Grandmother   . Arthritis Maternal Grandfather   . Hyperlipidemia Maternal Grandfather   . Heart disease Maternal Grandfather   . Hypertension Maternal Grandfather   . Arthritis Paternal Grandmother   . Breast cancer Paternal Grandmother   . Hypertension Paternal Grandmother   . Arthritis Paternal Grandfather   . Hyperlipidemia Paternal Grandfather   . Heart disease Paternal Grandfather   . Diabetes  Paternal Grandfather   . Hypertension Paternal Grandfather    Social History   Social History  . Marital Status: Widowed    Spouse Name: N/A  . Number of Children: N/A  . Years of Education: N/A   Social History Main Topics  . Smoking status: Never Smoker   . Smokeless tobacco: Never Used  . Alcohol Use: 0.0 oz/week    0 Standard drinks or equivalent per week     Comment: 1 drink every 2 months  . Drug Use: No  . Sexual Activity: Not Asked   Other Topics Concern  . None   Social History Narrative    Outpatient Encounter Prescriptions as of 02/23/2016  Medication Sig  . lisinopril (PRINIVIL,ZESTRIL) 20 MG tablet Take 1 tablet (20 mg total) by mouth daily.  Marland Kitchen loratadine (CLARITIN) 10 MG tablet Take 10 mg by mouth daily as needed for allergies.  . ranitidine (ZANTAC) 150 MG tablet Take 150 mg by mouth at bedtime as needed for heartburn.  . triamcinolone cream (KENALOG) 0.1 % Apply 1 application topically 2 (two) times daily.   No facility-administered encounter medications on file as of 02/23/2016.    Review of Systems  Constitutional: Negative for appetite change and unexpected weight change.  HENT: Negative for congestion and sinus pressure.   Respiratory: Negative for  cough, chest tightness and shortness of breath.   Cardiovascular: Positive for leg swelling (stable swelling.  ). Negative for chest pain and palpitations.  Gastrointestinal: Negative for nausea, vomiting, abdominal pain and diarrhea.  Genitourinary: Negative for dysuria and difficulty urinating.  Musculoskeletal: Negative for back pain and joint swelling.  Skin:       Venostasis changes lower extremities.  Rash noticed lower extremity.    Neurological: Negative for dizziness, light-headedness and headaches.  Psychiatric/Behavioral: Negative for dysphoric mood and agitation.       Objective:    Physical Exam  Constitutional: He appears well-developed and well-nourished. No distress.  HENT:  Nose: Nose  normal.  Mouth/Throat: Oropharynx is clear and moist.  Neck: Neck supple. No thyromegaly present.  Cardiovascular: Normal rate and regular rhythm.   Pulmonary/Chest: Effort normal and breath sounds normal. No respiratory distress.  Abdominal: Soft. Bowel sounds are normal. There is no tenderness.  Musculoskeletal: He exhibits no tenderness.  Stable - edema.  Venostasis changes noted - lower extremities.    Lymphadenopathy:    He has no cervical adenopathy.  Skin: Skin is dry. Rash noted.  Psychiatric: He has a normal mood and affect. His behavior is normal.    BP 122/80 mmHg  Pulse 72  Temp(Src) 98.5 F (36.9 C) (Oral)  Resp 18  Ht _0  (1.778 m)  Wt 271 lb 8 oz (123.152 kg)  BMI 38.96 kg/m2  SpO2 95% Wt Readings from Last 3 Encounters:  02/23/16 271 lb 8 oz (123.152 kg)  11/18/15 274 lb 8 oz (124.512 kg)  09/11/15 272 lb 8 oz (123.605 kg)     Lab Results  Component Value Date   WBC 6.8 09/20/2015   HGB 12.8* 05/05/2015   HCT 40.8 09/20/2015   PLT 262 09/20/2015   GLUCOSE 111* 02/07/2016   CHOL 180 02/07/2016   TRIG 64 02/07/2016   HDL 44 02/07/2016   LDLCALC 123* 02/07/2016   ALT 17 02/07/2016   AST 16 02/07/2016   NA 140 02/07/2016   K 3.7 02/07/2016   CL 101 02/07/2016   CREATININE 0.71* 02/07/2016   BUN 24 02/07/2016   CO2 25 02/07/2016   TSH 1.29 05/05/2015   HGBA1C 5.8* 02/07/2016    Dg Lumbar Spine 2-3 Views  09/17/2015  CLINICAL DATA:  Lower back pain. EXAM: LUMBAR SPINE - 2-3 VIEW COMPARISON:  None. FINDINGS: Multilevel spondylosis noted throughout the visualized spine. Moderate proliferative changes are present at T12-L1 and L1-L2. There is suggestion of a minimal retrolisthesis of L2 on L3 of approximately 4-5 mm. Mild proliferative changes are present at L3-4. Moderate facet hypertrophy present at L4-5 and L5-S1. No acute fracture identified. No bony lesions are seen. 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 There  is mild retrolisthesis of L2 on L3 by approximately 4-5 mm. Electronically Signed   By: Aletta Edouard M.D.   On: 09/17/2015 09:02       Assessment & Plan:   Problem List Items Addressed This Visit    Essential hypertension - Primary    Blood pressure under good control.  Continue same medication regimen.  Follow pressures.  Follow metabolic panel.        GERD (gastroesophageal reflux disease)    No upper symptoms reported.        History of colonic polyps    Colonoscopy 06/27/15.  Recommended f/u colonoscopy in five years.       Hypercholesterolemia    Discussed cholesterol.  LDL  123.  Diet and exercise.  Follow lipid panel.        Obesity    Diet and exercise.        Secondary diabetes with peripheral circulatory disorder (HCC)    On no medication.  Low carb diet and exercise.  Follow met b and a1c.  a1c just checked 5.8.        Venous stasis ulcer (HCC)    Stable venostasis changes - lower extremities.  Rash present.  TCC cream as directed.  Compression hose.            [name], MD

## 2016-02-29 ENCOUNTER — Encounter: Payer: Self-pay | Admitting: Internal Medicine

## 2016-02-29 DIAGNOSIS — E7800 Pure hypercholesterolemia, unspecified: Secondary | ICD-10-CM | POA: Insufficient documentation

## 2016-02-29 NOTE — Assessment & Plan Note (Signed)
Blood pressure under good control.  Continue same medication regimen.  Follow pressures.  Follow metabolic panel.   

## 2016-02-29 NOTE — Assessment & Plan Note (Signed)
Diet and exercise.   

## 2016-02-29 NOTE — Assessment & Plan Note (Addendum)
On no medication.  Low carb diet and exercise.  Follow met b and a1c.  a1c just checked 5.8.

## 2016-02-29 NOTE — Assessment & Plan Note (Signed)
Discussed cholesterol.  LDL 123.  Diet and exercise.  Follow lipid panel.

## 2016-02-29 NOTE — Assessment & Plan Note (Signed)
Colonoscopy 06/27/15.  Recommended f/u colonoscopy in five years.

## 2016-02-29 NOTE — Assessment & Plan Note (Signed)
Stable venostasis changes - lower extremities.  Rash present.  TCC cream as directed.  Compression hose.

## 2016-02-29 NOTE — Assessment & Plan Note (Signed)
No upper symptoms reported.   

## 2016-04-06 ENCOUNTER — Encounter: Payer: Self-pay | Admitting: Internal Medicine

## 2016-04-07 NOTE — Telephone Encounter (Signed)
Please call pt and let him know that I think he needs to be seen today.  Recommend acute care today and then can f/u after.  Thanks

## 2016-04-07 NOTE — Telephone Encounter (Signed)
Spoke with you about this.  He needs to be seen today.  Thanks

## 2016-04-07 NOTE — Telephone Encounter (Signed)
Noted.  Please continue to try to call as well.  If unable to reach today - agree with sending my chart, leaving a message to be seen and please check on him tomorrow.   Thanks   Dr Nicki Reaper

## 2016-04-08 NOTE — Telephone Encounter (Signed)
I spoke with him last night, he was not going to go last night to the clinic, was going to go today to [facility] in I advised that he really should be seen Today and he declined.  He will follow up with [place] tomorrow.

## 2016-04-26 ENCOUNTER — Telehealth: Payer: Self-pay | Admitting: Internal Medicine

## 2016-04-26 NOTE — Telephone Encounter (Signed)
Spoke with the patient, he is having a follow up appt with Dr. Nicki Reaper on 05/24/2016.  He normally comes in 2 weeks prior to get lab work done.  He has a script already, but wants to make sure that is all he needs since he had the bleeding episode prior.  I will email the current labs over to Dr. Nicki Reaper as soon as I received the fax from his office. thanks

## 2016-04-26 NOTE — Telephone Encounter (Signed)
Pt would like to talk to Lavella Lemons about some issues that he spoke to her a few weeks ago. Please call pt after 3:30.

## 2016-04-27 NOTE — Telephone Encounter (Signed)
Please advise, thanks.

## 2016-04-27 NOTE — Telephone Encounter (Signed)
I have not seen the form.  Please hold until received.

## 2016-04-27 NOTE — Telephone Encounter (Signed)
Yes and I haven't received it yet, did it end up in your mailbox? He was going to send what he had already ordered.

## 2016-04-27 NOTE — Telephone Encounter (Signed)
Received paperwork and had D.Scott review, I will refax tomorrow, thanks.

## 2016-04-27 NOTE — Telephone Encounter (Signed)
I don't understand the message.  Is he going to send copy of labs (fax from his office?).

## 2016-04-28 NOTE — Telephone Encounter (Signed)
Left message for Patient to return my call 

## 2016-04-28 NOTE — Telephone Encounter (Signed)
Spoke with the patient, he will stop by to let me change the lab sheet he has to reflect the new lab needed. thanks

## 2016-05-06 ENCOUNTER — Other Ambulatory Visit: Payer: Self-pay | Admitting: Internal Medicine

## 2016-05-07 ENCOUNTER — Encounter: Payer: Self-pay | Admitting: Internal Medicine

## 2016-05-07 ENCOUNTER — Telehealth: Payer: Self-pay | Admitting: Internal Medicine

## 2016-05-07 DIAGNOSIS — S81809A Unspecified open wound, unspecified lower leg, initial encounter: Secondary | ICD-10-CM

## 2016-05-07 LAB — BASIC METABOLIC PANEL WITH GFR
BUN/Creatinine Ratio: 29 — ABNORMAL HIGH (ref 9–20)
BUN: 25 mg/dL — AB (ref 4–21)
BUN: 25 mg/dL — ABNORMAL HIGH (ref 6–24)
CO2: 24 mmol/L (ref 18–29)
Calcium: 9 mg/dL (ref 8.7–10.2)
Chloride: 101 mmol/L (ref 96–106)
Creatinine, Ser: 0.87 mg/dL (ref 0.76–1.27)
Creatinine: 0.9 mg/dL (ref 0.6–1.3)
GFR calc Af Amer: 113 mL/min/1.73
GFR calc non Af Amer: 98 mL/min/1.73
Glucose: 116 mg/dL
Glucose: 116 mg/dL — ABNORMAL HIGH (ref 65–99)
Potassium: 3.9 mmol/L (ref 3.4–5.3)
Potassium: 3.9 mmol/L (ref 3.5–5.2)
Sodium: 142 mmol/L (ref 134–144)
Sodium: 142 mmol/L (ref 137–147)

## 2016-05-07 LAB — LIPID PANEL W/O CHOL/HDL RATIO
CHOLESTEROL TOTAL: 175 mg/dL (ref 100–199)
HDL: 47 mg/dL (ref >39)
LDL Calculated: 117 mg/dL — ABNORMAL HIGH (ref 0–99)
TRIGLYCERIDES: 57 mg/dL (ref 0–149)
VLDL Cholesterol Cal: 11 mg/dL (ref 5–40)

## 2016-05-07 LAB — CBC/DIFF AMBIGUOUS DEFAULT
BASOS ABS: 0 10*3/uL (ref 0.0–0.2)
Basos: 0 %
EOS (ABSOLUTE): 0.2 10*3/uL (ref 0.0–0.4)
EOS: 2 %
HEMATOCRIT: 35.7 % — AB (ref 37.5–51.0)
HEMOGLOBIN: 12.2 g/dL — AB (ref 12.6–17.7)
IMMATURE GRANS (ABS): 0 10*3/uL (ref 0.0–0.1)
Immature Granulocytes: 0 %
LYMPHS: 26 %
Lymphocytes Absolute: 1.8 10*3/uL (ref 0.7–3.1)
MCH: 28.4 pg (ref 26.6–33.0)
MCHC: 34.2 g/dL (ref 31.5–35.7)
MCV: 83 fL (ref 79–97)
MONOCYTES: 10 %
Monocytes Absolute: 0.7 10*3/uL (ref 0.1–0.9)
NEUTROS ABS: 4.3 10*3/uL (ref 1.4–7.0)
Neutrophils: 62 %
Platelets: 250 10*3/uL (ref 150–379)
RBC: 4.29 x10E6/uL (ref 4.14–5.80)
RDW: 14.3 % (ref 12.3–15.4)
WBC: 7 10*3/uL (ref 3.4–10.8)

## 2016-05-07 LAB — HEMOGLOBIN A1C: Hemoglobin A1C: 5.5

## 2016-05-07 LAB — CBC AND DIFFERENTIAL
HCT: 36 % — AB (ref 41–53)
Hemoglobin: 12.2 g/dL — AB (ref 13.5–17.5)
Neutrophils Absolute: 4 /uL
Platelets: 250 K/µL (ref 150–399)
WBC: 7 10*3/mL

## 2016-05-07 LAB — LIPID PANEL
CHOLESTEROL: 175 mg/dL (ref 0–200)
HDL: 47 mg/dL (ref 35–70)
LDL CALC: 117 mg/dL
Triglycerides: 57 mg/dL (ref 40–160)

## 2016-05-07 LAB — VITAMIN D 25 HYDROXY (VIT D DEFICIENCY, FRACTURES): Vit D, 25-Hydroxy: 25.2 ng/mL — ABNORMAL LOW (ref 30.0–100.0)

## 2016-05-07 LAB — TSH
TSH: 1.49 u[IU]/mL (ref 0.41–5.90)
TSH: 1.49 u[IU]/mL (ref 0.450–4.500)

## 2016-05-07 LAB — HEPATIC FUNCTION PANEL
ALK PHOS: 71 IU/L (ref 39–117)
ALT: 16 IU/L (ref 0–44)
ALT: 16 U/L (ref 10–40)
AST: 18 IU/L (ref 0–40)
AST: 18 U/L (ref 14–40)
Albumin: 3.8 g/dL (ref 3.5–5.5)
Alkaline Phosphatase: 71 U/L (ref 25–125)
BILIRUBIN, DIRECT: 0.11 mg/dL (ref 0.00–0.40)
Bilirubin Total: 0.3 mg/dL (ref 0.0–1.2)
Bilirubin, Direct: 0.11 mg/dL (ref 0.01–0.4)
Bilirubin, Total: 0.3 mg/dL
TOTAL PROTEIN: 6.8 g/dL (ref 6.0–8.5)

## 2016-05-07 LAB — HGB A1C W/O EAG: HEMOGLOBIN A1C: 5.5 % (ref 4.8–5.6)

## 2016-05-07 NOTE — Telephone Encounter (Signed)
Results requested

## 2016-05-07 NOTE — Telephone Encounter (Signed)
Results received & placed in yellow folder for review

## 2016-05-07 NOTE — Telephone Encounter (Signed)
Labs were done at [facility], do we have them yet? I don't see them in the chart. thanks

## 2016-05-07 NOTE — Telephone Encounter (Signed)
I have not seen these labs.  Will need to call [facility] and get them to fax.

## 2016-05-07 NOTE — Telephone Encounter (Signed)
Pt had labs yesterday. He has had some more bleeding this morning and wanted to know if his labs results are back.

## 2016-05-08 NOTE — Telephone Encounter (Signed)
Order placed for wound center referral.

## 2016-05-08 NOTE — Telephone Encounter (Signed)
Pt notified of lab results.  Will d/w him more at his upcoming appt.  Discussed his leg.  No bleeding currently.  Has lesion that continues to come off and then will have increased bleeding.  Refer to wound center for evaluation and treatment.

## 2016-05-17 ENCOUNTER — Encounter: Payer: BLUE CROSS/BLUE SHIELD | Attending: Surgery | Admitting: Surgery

## 2016-05-17 DIAGNOSIS — I10 Essential (primary) hypertension: Secondary | ICD-10-CM | POA: Insufficient documentation

## 2016-05-17 DIAGNOSIS — I87301 Chronic venous hypertension (idiopathic) without complications of right lower extremity: Secondary | ICD-10-CM | POA: Insufficient documentation

## 2016-05-17 DIAGNOSIS — E1151 Type 2 diabetes mellitus with diabetic peripheral angiopathy without gangrene: Secondary | ICD-10-CM | POA: Diagnosis not present

## 2016-05-17 DIAGNOSIS — Z6835 Body mass index (BMI) 35.0-35.9, adult: Secondary | ICD-10-CM | POA: Diagnosis not present

## 2016-05-17 DIAGNOSIS — E11622 Type 2 diabetes mellitus with other skin ulcer: Secondary | ICD-10-CM | POA: Diagnosis present

## 2016-05-17 DIAGNOSIS — I87302 Chronic venous hypertension (idiopathic) without complications of left lower extremity: Secondary | ICD-10-CM | POA: Diagnosis not present

## 2016-05-17 DIAGNOSIS — K219 Gastro-esophageal reflux disease without esophagitis: Secondary | ICD-10-CM | POA: Insufficient documentation

## 2016-05-17 NOTE — Progress Notes (Addendum)
JODAN, BAPTIST ([id number]) Visit Report for 05/17/2016 Allergy List Details Patient Name: [name], [name] Date of Service: 05/17/2016 2:15 PM Medical Record Number: [id number] Patient Account Number: [id number] Date of Birth/Sex: 01/12/1961 (56 y.o. Male) Treating RN: Cornell Barman Primary Care Physician: [name] Other Clinician: Referring Physician: [name] Treating Physician/Extender: Frann Rider in Treatment: 0 Allergies Active Allergies No Known Drug Allergies Allergy Notes Electronic Signature(s) Signed: 05/17/2016 5:08:33 PM By: Gretta Cool, RN, BSN, Kim RN, BSN Entered By: Gretta Cool, RN, BSN, Kim on 05/17/2016 15:03:18 [name], [name] ([id number]) -------------------------------------------------------------------------------- Arrival Information Details Patient Name: [name], [name] Date of Service: 05/17/2016 2:15 PM Medical Record Number: [id number] Patient Account Number: [id number] Date of Birth/Sex: Oct 28, 1961 (55 y.o. Male) Treating RN: Cornell Barman Primary Care Physician: [name] Other Clinician: Referring Physician: [name] Treating Physician/Extender: Frann Rider in Treatment: 0 Visit Information Patient Arrived: Ambulatory Arrival Time: 14:44 Accompanied By: Barb Merino Transfer Assistance: None Patient Identification Verified: Yes Secondary Verification Process Yes Completed: Patient Has Alerts: Yes Patient Alerts: DM II ABI (R) non-comp >220 Electronic Signature(s) Signed: 05/17/2016 5:08:33 PM By: Gretta Cool, RN, BSN, Kim RN, BSN Entered By: Gretta Cool, RN, BSN, Kim on 05/17/2016 15:12:51 [name], [name] ([id number]) -------------------------------------------------------------------------------- Clinic Level of Care Assessment Details Patient Name: [name], [name] Date of Service: 05/17/2016 2:15 PM Medical Record Number: [id number] Patient Account Number: [id number] Date of Birth/Sex: [birth year] (55 y.o.  Male) Treating RN: Cornell Barman Primary Care Physician: [name] Other Clinician: Referring Physician: [name] Treating Physician/Extender: Frann Rider in Treatment: 0 Clinic Level of Care Assessment Items TOOL 2 Quantity Score []  - Use when only an EandM is performed on the INITIAL visit 0 ASSESSMENTS - Nursing Assessment / Reassessment []  - General Physical Exam (combine w/ comprehensive assessment (listed just 0 below) when performed on new pt. evals) X - Comprehensive Assessment (HX, ROS, Risk Assessments, Wounds Hx, etc.) 1 25 ASSESSMENTS - Wound and Skin Assessment / Reassessment X - Simple Wound Assessment / Reassessment - one wound 1 5 []  - Complex Wound Assessment / Reassessment - multiple wounds 0 []  - Dermatologic / Skin Assessment (not related to wound area) 0 ASSESSMENTS - Ostomy and/or Continence Assessment and Care []  - Incontinence Assessment and Management 0 []  - Ostomy Care Assessment and Management (repouching, etc.) 0 PROCESS - Coordination of Care X - Simple Patient / Family Education for ongoing care 1 15 []  - Complex (extensive) Patient / Family Education for ongoing care 0 X - Staff obtains Programmer, systems, Records, Test Results / Process Orders 1 10 []  - Staff telephones HHA, Nursing Homes / Clarify orders / etc 0 []  - Routine Transfer to another Facility (non-emergent condition) 0 []  - Routine Hospital Admission (non-emergent condition) 0 []  - New Admissions / Biomedical engineer / Ordering NPWT, Apligraf, etc. 0 []  - Emergency Hospital Admission (emergent condition) 0 X - Simple Discharge Coordination 1 10 [name], [name] ([id number]) []  - Complex (extensive) Discharge Coordination 0 PROCESS - Special Needs []  - Pediatric / Minor Patient Management 0 []  - Isolation Patient Management 0 []  - Hearing / Language / Visual special needs 0 []  - Assessment of Community assistance (transportation, D/C planning, etc.) 0 []  - Additional  assistance / Altered mentation 0 []  - Support Surface(s) Assessment (bed, cushion, seat, etc.) 0 INTERVENTIONS - Wound Cleansing / Measurement X - Wound Imaging (photographs - any number of wounds) 1 5 []  - Wound Tracing (instead of photographs) 0 X - Simple Wound Measurement - one  wound 1 5 []  - Complex Wound Measurement - multiple wounds 0 X - Simple Wound Cleansing - one wound 1 5 []  - Complex Wound Cleansing - multiple wounds 0 INTERVENTIONS - Wound Dressings X - Small Wound Dressing one or multiple wounds 1 10 []  - Medium Wound Dressing one or multiple wounds 0 []  - Large Wound Dressing one or multiple wounds 0 []  - Application of Medications - injection 0 INTERVENTIONS - Miscellaneous []  - External ear exam 0 []  - Specimen Collection (cultures, biopsies, blood, body fluids, etc.) 0 []  - Specimen(s) / Culture(s) sent or taken to Lab for analysis 0 []  - Patient Transfer (multiple staff / Civil Service fast streamer / Similar devices) 0 []  - Simple Staple / Suture removal (25 or less) 0 []  - Complex Staple / Suture removal (26 or more) 0 [name], [name] ([id number]) []  - Hypo / Hyperglycemic Management (close monitor of Blood Glucose) 0 X - Ankle / Brachial Index (ABI) - do not check if billed separately 1 15 Has the patient been seen at the hospital within the last three years: Yes Total Score: 105 Level Of Care: New/Established - Level 3 Electronic Signature(s) Signed: 05/17/2016 5:08:33 PM By: Gretta Cool, RN, BSN, Kim RN, BSN Entered By: Gretta Cool, RN, BSN, Kim on 05/17/2016 15:46:59 [name], [name] ([id number]) -------------------------------------------------------------------------------- Encounter Discharge Information Details Patient Name: [name], [name] Date of Service: 05/17/2016 2:15 PM Medical Record Number: [id number] Patient Account Number: [id number] Date of Birth/Sex: 09-06-1961 (55 y.o. Male) Treating RN: Cornell Barman Primary Care Physician: [name] Other  Clinician: Referring Physician: [name] Treating Physician/Extender: Frann Rider in Treatment: 0 Encounter Discharge Information Items Discharge Pain Level: 0 Discharge Condition: Stable Ambulatory Status: Ambulatory Discharge Destination: Home Transportation: Private Auto Accompanied By: [name] Schedule Follow-up Appointment: Yes Medication Reconciliation completed and provided to Patient/Care Yes Saajan Willmon: Provided on Clinical Summary of Care: 05/17/2016 Form Type Recipient Paper Patient CC Electronic Signature(s) Signed: 05/17/2016 5:08:33 PM By: Gretta Cool RN, BSN, Kim RN, BSN Previous Signature: 05/17/2016 3:45:01 PM Version By: Ruthine Dose Entered By: Gretta Cool RN, BSN, Kim on 05/17/2016 15:47:33 Graziani, [name] ([id number]) -------------------------------------------------------------------------------- General Visit Notes Details Patient Name: [name], [name] Date of Service: 05/17/2016 2:15 PM Medical Record Number: [id number] Patient Account Number: [id number] Date of Birth/Sex: 15-Oct-1961 (55 y.o. Male) Treating RN: Cornell Barman Primary Care Physician: [name] Other Clinician: Referring Physician: [name] Treating Physician/Extender: Frann Rider in Treatment: 0 Notes Order was placed for patient through [facility] for compression stockings. Patient was also given information for ETI in  if insurance would not cover the stockings. Electronic Signature(s) Signed: 05/17/2016 5:08:33 PM By: Gretta Cool, RN, BSN, Kim RN, BSN Entered By: Gretta Cool, RN, BSN, Kim on 05/17/2016 17:08:17 [name], [name] ([id number]) -------------------------------------------------------------------------------- Lower Extremity Assessment Details Patient Name: [name], [name] Date of Service: 05/17/2016 2:15 PM Medical Record Number: [id number] Patient Account Number: [id number] Date of Birth/Sex: 21-Nov-1961 (55 y.o. Male) Treating RN: Cornell Barman Primary Care Physician: [name] Other Clinician: Referring Physician: [name] Treating Physician/Extender: Frann Rider in Treatment: 0 Edema Assessment Assessed: [Left: No] [Right: No] E[Left: dema] [Right: :] Calf Left: Right: Point of Measurement: 39 cm From Medial Instep 42 cm 43 cm Ankle Left: Right: Point of Measurement: 11 cm From Medial Instep 26.5 cm 26.8 cm Vascular Assessment Pulses: Posterior Tibial Palpable: [Left:Yes] [Right:Yes] Dorsalis Pedis Palpable: [Left:Yes] [Right:Yes] Extremity colors, hair growth, and conditions: Extremity Color: [Left:Hyperpigmented] [Right:Hyperpigmented] Hair Growth on Extremity: [Left:Yes] [Right:Yes] Temperature of Extremity: [Left:Warm] [  Right:Warm] Capillary Refill: [Left:< 3 seconds] [Right:< 3 seconds] Blood Pressure: Brachial: [Left:150] Dorsalis Pedis: 220 [Left:Dorsalis Pedis:] Ankle: Posterior Tibial: [Left:Posterior Tibial: 1.47] Toe Nail Assessment Left: Right: Thick: Yes Yes Discolored: Yes Yes Deformed: No No Improper Length and Hygiene: No No Notes Lage, SUHAAN JAQUES ([id number]) Right ABI non compressible >220 Electronic Signature(s) Signed: 05/17/2016 5:08:33 PM By: Gretta Cool, RN, BSN, Kim RN, BSN Entered By: Gretta Cool, RN, BSN, Kim on 05/17/2016 15:34:42 Mcilwain, [name] ([id number]) -------------------------------------------------------------------------------- Multi Wound Chart Details Patient Name: Borawski, Jandel T. Date of Service: 05/17/2016 2:15 PM Medical Record Number: [id number] Patient Account Number: [id number] Date of Birth/Sex: 1961-08-15 (55 y.o. Male) Treating RN: Cornell Barman Primary Care Physician: [name] Other Clinician: Referring Physician: [name] Treating Physician/Extender: Frann Rider in Treatment: 0 Vital Signs Height(in): 73 Pulse(bpm): 67 Weight(lbs): 266 Blood Pressure 149/77 (mmHg): Body Mass Index(BMI): 35 Temperature(F):  98.7 Respiratory Rate 18 (breaths/min): Photos: [N/A:N/A] Wound Location: Right Foot - Medial N/A N/A Wounding Event: Gradually Appeared N/A N/A Primary Etiology: To be determined N/A N/A Date Acquired: 04/06/2016 N/A N/A Weeks of Treatment: 0 N/A N/A Wound Status: Open N/A N/A Measurements L x W x D 0.1x0.1x0.1 N/A N/A (cm) Area (cm) : 0.008 N/A N/A Volume (cm) : 0.001 N/A N/A Classification: Partial Thickness N/A N/A Exudate Amount: None Present N/A N/A Wound Margin: Flat and Intact N/A N/A Granulation Amount: Large (67-100%) N/A N/A Necrotic Amount: None Present (0%) N/A N/A Exposed Structures: Fascia: No N/A N/A Fat: No Tendon: No Muscle: No Joint: No Bone: No Limited to Skin Breakdown [name], [name] ([id number]) Epithelialization: Large (67-100%) N/A N/A Periwound Skin Texture: Edema: No N/A N/A Excoriation: No Induration: No Callus: No Crepitus: No Fluctuance: No Friable: No Rash: No Scarring: No Periwound Skin Maceration: No N/A N/A Moisture: Moist: No Dry/Scaly: No Periwound Skin Color: Atrophie Blanche: No N/A N/A Cyanosis: No Ecchymosis: No Erythema: No Hemosiderin Staining: No Mottled: No Pallor: No Rubor: No Tenderness on No N/A N/A Palpation: Wound Preparation: Ulcer Cleansing: Not N/A N/A Cleansed Topical Anesthetic Applied: None Treatment Notes Electronic Signature(s) Signed: 05/17/2016 5:08:33 PM By: Gretta Cool, RN, BSN, Kim RN, BSN Entered By: Gretta Cool, RN, BSN, Kim on 05/17/2016 15:46:26 [name], [name] ([id number]) -------------------------------------------------------------------------------- [place] Plan Details Patient Name: Patel, Enrico T. Date of Service: 05/17/2016 2:15 PM Medical Record Number: [id number] Patient Account Number: [id number] Date of Birth/Sex: 23-Dec-1960 (55 y.o. Male) Treating RN: Cornell Barman Primary Care Physician: [name] Other Clinician: Referring Physician: [name] Treating Physician/Extender: Frann Rider in Treatment: 0 Active Inactive Electronic Signature(s) Signed: 07/20/2016 1:43:35 PM By: Gretta Cool, RN, BSN, Kim RN, BSN Previous Signature: 05/17/2016 5:08:33 PM Version By: Gretta Cool, RN, BSN, Kim RN, BSN Entered By: Gretta Cool, RN, BSN, Kim on 06/17/2016 10:11:29 Junod, [name] ([id number]) -------------------------------------------------------------------------------- Pain Assessment Details Patient Name: Middlekauff, Laythan T. Date of Service: 05/17/2016 2:15 PM Medical Record Number: [id number] Patient Account Number: [id number] Date of Birth/Sex: 08-Jul-1961 (55 y.o. Male) Treating RN: Cornell Barman Primary Care Physician: [name] Other Clinician: Referring Physician: [name] Treating Physician/Extender: Frann Rider in Treatment: 0 Active Problems Location of Pain Severity and Description of Pain Patient Has Paino No Site Locations With Dressing Change: No Pain Management and Medication Current Pain Management: Electronic Signature(s) Signed: 05/17/2016 5:08:33 PM By: Gretta Cool, RN, BSN, Kim RN, BSN Entered By: Gretta Cool, RN, BSN, Kim on 05/17/2016 14:50:38 Simao, [name] ([id number]) -------------------------------------------------------------------------------- Patient/Caregiver Education Details Patient Name: Petkus, Elward T. Date of Service: 05/17/2016 2:15 PM Medical Record  Number: [id number] Patient Account Number: [id number] Date of Birth/Gender: February 17, 1961 (55 y.o. Male) Treating RN: Cornell Barman Primary Care Physician: [name] Other Clinician: Referring Physician: [name] Treating Physician/Extender: Frann Rider in Treatment: 0 Education Assessment Education Provided To: Patient Education Topics Provided Venous: Handouts: Managing Venous Disease and Related Ulcers Methods: Demonstration, Explain/Verbal Responses: State content correctly Electronic Signature(s) Signed:  05/17/2016 5:08:33 PM By: Gretta Cool, RN, BSN, Kim RN, BSN Entered By: Gretta Cool, RN, BSN, Kim on 05/17/2016 15:47:45 Setter, [name] ([id number]) -------------------------------------------------------------------------------- Wound Assessment Details Patient Name: Sol, Terez T. Date of Service: 05/17/2016 2:15 PM Medical Record Number: [id number] Patient Account Number: [id number] Date of Birth/Sex: Oct 08, 1961 (55 y.o. Male) Treating RN: Cornell Barman Primary Care Physician: [name] Other Clinician: Referring Physician: [name] Treating Physician/Extender: Frann Rider in Treatment: 0 Wound Status Wound Number: 1 Primary Etiology: To be determined Wound Location: Right Foot - Medial Wound Status: Open Wounding Event: Gradually Appeared Date Acquired: 04/06/2016 Weeks Of Treatment: 0 Clustered Wound: No Photos Wound Measurements Length: (cm) 0.1 Width: (cm) 0.1 Depth: (cm) 0.1 Area: (cm) 0.008 Volume: (cm) 0.001 % Reduction in Area: % Reduction in Volume: Epithelialization: Large (67-100%) Tunneling: No Undermining: No Wound Description Classification: Partial Thickness Wound Margin: Flat and Intact Exudate Amount: None Present Wound Bed Granulation Amount: Large (67-100%) Exposed Structure Necrotic Amount: None Present (0%) Fascia Exposed: No Fat Layer Exposed: No Tendon Exposed: No Muscle Exposed: No Joint Exposed: No Bone Exposed: No [place], Daevon T. ([id number]) Limited to Skin Breakdown Periwound Skin Texture Texture Color No Abnormalities Noted: No No Abnormalities Noted: No Callus: No Atrophie Blanche: No Crepitus: No Cyanosis: No Excoriation: No Ecchymosis: No Fluctuance: No Erythema: No Friable: No Hemosiderin Staining: No Induration: No Mottled: No Localized Edema: No Pallor: No Rash: No Rubor: No Scarring: No Moisture No Abnormalities Noted: No Dry / Scaly: No Maceration: No Moist: No Wound Preparation Ulcer  Cleansing: Not Cleansed Topical Anesthetic Applied: None Electronic Signature(s) Signed: 05/17/2016 5:08:33 PM By: Gretta Cool, RN, BSN, Kim RN, BSN Entered By: Gretta Cool, RN, BSN, Kim on 05/17/2016 15:02:42 Pritts, [name] ([id number]) -------------------------------------------------------------------------------- [place] Details Patient Name: Caponi, Andy T. Date of Service: 05/17/2016 2:15 PM Medical Record Number: [id number] Patient Account Number: [id number] Date of Birth/Sex: 1961-11-10 (55 y.o. Male) Treating RN: Cornell Barman Primary Care Physician: [name] Other Clinician: Referring Physician: [name] Treating Physician/Extender: Frann Rider in Treatment: 0 Vital Signs Time Taken: 14:50 Temperature (F): 98.7 Height (in): 73 Pulse (bpm): 67 Source: Stated Respiratory Rate (breaths/min): 18 Weight (lbs): 266 Blood Pressure (mmHg): 149/77 Source: Stated Reference Range: 80 - 120 mg / dl Body Mass Index (BMI): 35.1 Electronic Signature(s) Signed: 05/17/2016 5:08:33 PM By: Gretta Cool, RN, BSN, Kim RN, BSN Entered By: Gretta Cool, RN, BSN, Kim on 05/17/2016 14:52:00

## 2016-05-17 NOTE — Progress Notes (Addendum)
GEDDY, PALUZZI ([id number]) Visit Report for 05/17/2016 Abuse/Suicide Risk Screen Details Patient Name: [name], [name] Date of Service: 05/17/2016 2:15 PM Medical Record Number: [id number] Patient Account Number: [id number] Date of Birth/Sex: [birth year] (55 y.o. Male) Treating RN: Cornell Barman Primary Care Physician: [name] Other Clinician: Referring Physician: [name] Treating Physician/Extender: Frann Rider in Treatment: 0 Abuse/Suicide Risk Screen Items Answer ABUSE/SUICIDE RISK SCREEN: Has anyone close to you tried to hurt or harm you recentlyo No Do you feel uncomfortable with anyone in your familyo No Has anyone forced you do things that you didnot want to doo No Do you have any thoughts of harming yourselfo No Patient displays signs or symptoms of abuse and/or neglect. No Electronic Signature(s) Signed: 05/17/2016 5:08:33 PM By: Gretta Cool, RN, BSN, Kim RN, BSN Entered By: Gretta Cool, RN, BSN, Kim on 05/17/2016 15:09:55 Gripp, Jolene Schimke ([id number]) -------------------------------------------------------------------------------- Activities of Daily Living Details Patient Name: [name], [name] Date of Service: 05/17/2016 2:15 PM Medical Record Number: [id number] Patient Account Number: [id number] Date of Birth/Sex: February 03, 1961 (55 y.o. Male) Treating RN: Cornell Barman Primary Care Physician: [name] Other Clinician: Referring Physician: [name] Treating Physician/Extender: Frann Rider in Treatment: 0 Activities of Daily Living Items Answer Activities of Daily Living (Please select one for each item) Drive Automobile Completely Able Take Medications Completely Able Use Telephone Completely Able Care for Appearance Completely Able Use Toilet Completely Able Bath / Shower Completely Able Dress Self Completely Able Feed Self Completely Able Walk Completely Able Get In / Out Bed Completely Able Housework Completely Able Prepare  Meals Completely Able Handle Money Completely Able Shop for Self Completely Able Electronic Signature(s) Signed: 05/17/2016 5:08:33 PM By: Gretta Cool, RN, BSN, Kim RN, BSN Entered By: Gretta Cool, RN, BSN, Kim on 05/17/2016 15:10:05 Wignall, Jolene Schimke ([id number]) -------------------------------------------------------------------------------- Education Assessment Details Patient Name: [name], [name] Date of Service: 05/17/2016 2:15 PM Medical Record Number: [id number] Patient Account Number: [id number] Date of Birth/Sex: 1960-12-30 (55 y.o. Male) Treating RN: Cornell Barman Primary Care Physician: [name] Other Clinician: Referring Physician: [name] Treating Physician/Extender: Frann Rider in Treatment: 0 Primary Learner Assessed: Patient Learning Preferences/Education Level/Primary Language Learning Preference: Explanation, Demonstration Highest Education Level: College or Above Preferred Language: English Cognitive Barrier Assessment/Beliefs Language Barrier: No Translator Needed: No Memory Deficit: No Emotional Barrier: No Cultural/Religious Beliefs Affecting Medical No Care: Physical Barrier Assessment Impaired Vision: No Impaired Hearing: No Decreased Hand dexterity: No Knowledge/Comprehension Assessment Knowledge Level: High Comprehension Level: High Ability to understand written High instructions: Ability to understand verbal High instructions: Motivation Assessment Anxiety Level: Calm Cooperation: Cooperative Education Importance: Acknowledges Need Interest in Health Problems: Asks Questions Perception: Coherent Willingness to Engage in Self- High Management Activities: Readiness to Engage in Self- High Management Activities: Electronic Signature(s) NATTHAN, MCLAURY ([id number]) Signed: 05/17/2016 5:08:33 PM By: Gretta Cool, RN, BSN, Kim RN, BSN Entered By: Gretta Cool, RN, BSN, Kim on 05/17/2016 15:10:39 Cryan, Jolene Schimke  ([id number]) -------------------------------------------------------------------------------- Fall Risk Assessment Details Patient Name: [name], [name] Date of Service: 05/17/2016 2:15 PM Medical Record Number: [id number] Patient Account Number: [id number] Date of Birth/Sex: [birth year] (55 y.o. Male) Treating RN: Cornell Barman Primary Care Physician: [name] Other Clinician: Referring Physician: [name] Treating Physician/Extender: Frann Rider in Treatment: 0 Fall Risk Assessment Items Have you had 2 or more falls in the last 12 monthso 0 No Have you had any fall that resulted in injury in the last 12 monthso 0 No FALL RISK ASSESSMENT: History of falling - immediate or within  3 months 0 No Secondary diagnosis 0 No Ambulatory aid None/bed rest/wheelchair/nurse 0 Yes Crutches/cane/walker 0 No Furniture 0 No IV Access/Saline Lock 0 No Gait/Training Normal/bed rest/immobile 0 Yes Weak 0 No Impaired 0 No Mental Status Oriented to own ability 0 No Electronic Signature(s) Signed: 05/17/2016 5:08:33 PM By: Gretta Cool, RN, BSN, Kim RN, BSN Entered By: Gretta Cool, RN, BSN, Kim on 05/17/2016 15:10:51 Guardino, Jolene Schimke ([id number]) -------------------------------------------------------------------------------- Foot Assessment Details Patient Name: [name], [name] Date of Service: 05/17/2016 2:15 PM Medical Record Number: [id number] Patient Account Number: [id number] Date of Birth/Sex: 12/07/1960 (55 y.o. Male) Treating RN: Cornell Barman Primary Care Physician: [name] Other Clinician: Referring Physician: [name] Treating Physician/Extender: Frann Rider in Treatment: 0 Foot Assessment Items Site Locations + = Sensation present, - = Sensation absent, C = Callus, U = Ulcer R = Redness, W = Warmth, M = Maceration, PU = Pre-ulcerative lesion F = Fissure, S = Swelling, D = Dryness Assessment Right: Left: Other Deformity: No No Prior Foot Ulcer: No  No Prior Amputation: No No Charcot Joint: No No Ambulatory Status: Ambulatory Without Help Gait: Steady Electronic Signature(s) Signed: 05/17/2016 5:08:33 PM By: Gretta Cool, RN, BSN, Kim RN, BSN Entered By: Gretta Cool, RN, BSN, Kim on 05/17/2016 15:11:14 Moren, Jolene Schimke ([id number]) -------------------------------------------------------------------------------- Nutrition Risk Assessment Details Patient Name: [name], [name] Date of Service: 05/17/2016 2:15 PM Medical Record Number: [id number] Patient Account Number: [id number] Date of Birth/Sex: 1961/05/08 (55 y.o. Male) Treating RN: Cornell Barman Primary Care Physician: [name] Other Clinician: Referring Physician: [name] Treating Physician/Extender: Frann Rider in Treatment: 0 Height (in): 73 Weight (lbs): 266 Body Mass Index (BMI): 35.1 Nutrition Risk Assessment Items NUTRITION RISK SCREEN: I have an illness or condition that made me change the kind and/or 0 No amount of food I eat I eat fewer than two meals per day 0 No I eat few fruits and vegetables, or milk products 0 No I have three or more drinks of beer, liquor or wine almost every day 0 No I have tooth or mouth problems that make it hard for me to eat 0 No I don't always have enough money to buy the food I need 0 No I eat alone most of the time 0 No I take three or more different prescribed or over-the-counter drugs a 0 No day Without wanting to, I have lost or gained 10 pounds in the last six 0 No months I am not always physically able to shop, cook and/or feed myself 0 No Nutrition Protocols Good Risk Protocol 0 No interventions needed Moderate Risk Protocol Electronic Signature(s) Signed: 05/17/2016 5:08:33 PM By: Gretta Cool, RN, BSN, Kim RN, BSN Entered By: Gretta Cool, RN, BSN, Kim on 05/17/2016 15:11:03

## 2016-05-17 NOTE — Progress Notes (Addendum)
JAISON, [name] ([id number]) Visit Report for 05/17/2016 Chief Complaint Document Details Patient Name: [name], [name] Date of Service: 05/17/2016 2:15 PM Medical Record Number: [id number] Patient Account Number: [id number] Date of Birth/Sex: 12/17/1960 (55 y.o. Male) Treating RN: Cornell Barman Primary Care Physician: [name] Other Clinician: Referring Physician: [name] Treating Physician/Extender: Frann Rider in Treatment: 0 Information Obtained from: Patient Chief Complaint Patient presents for treatment of a bleeding ulcer due to venous insufficiency, on his right foot medially which she's had on and off for the last 2 months. Electronic Signature(s) Signed: 05/17/2016 4:04:05 PM By: Christin Fudge MD, FACS Entered By: Christin Fudge on 05/17/2016 16:04:05 Jasko, Jolene Schimke ([id number]) -------------------------------------------------------------------------------- HPI Details Patient Name: [name], [name] Date of Service: 05/17/2016 2:15 PM Medical Record Number: [id number] Patient Account Number: [id number] Date of Birth/Sex: [birth year] (55 y.o. Male) Treating RN: Cornell Barman Primary Care Physician: [name] Other Clinician: Referring Physician: [name] Treating Physician/Extender: Frann Rider in Treatment: 0 History of Present Illness Location: ulcerated area on the right foot medially which bleeds profusely Quality: Patient reports experiencing a dull pain to affected area(s). Severity: Patient states wound are getting worse. Duration: Patient has had the wound for > 2 months prior to seeking treatment at the wound center Context: The wound appeared gradually over time Modifying Factors: Other treatment(s) tried include:elevation and pressure to stop the bleeding Associated Signs and Symptoms: Patient reports having difficulty standing for long periods. HPI Description: 55 year old gentleman who was seen by his PCP for  essential hypertension, GERD, colon polyps, hypercholesterolemia, obesity, diabetes mellitus with peripheral circulatory disorder, venous stasis ulcer.Last hemoglobin A1c checked was 5.5 and 3 months and it was 5.8 in 8 months ago it was 5.9. Recently the patient complained of a wound on his leg which has been bleeding for a while and though he was not seen by his PCP they referred him to the wound center for evaluation. Past medical history significant for depression, hypertension, venous stasis disease, diabetes mellitus, status post cholecystectomy, status post hernia repair. he has never been a smoker. Review of his electronic medical records do not reveal any vascular workup done in the past. the patient has kept a accurate log of having the problem start on May 16 of this year when the foot started bleeding profusely and it had to be treated with pressure. He had similar problems in the middle of June and on 2 separate occasions it bled profusely. He is also had swelling of the ankles and cramps in his legs. He had a similar problem of bleeding in July 2012 and had to be transported to the ER and by that time the bleeding had stopped. She never had a vascular workup done but has been treated with compression stockings in the past Electronic Signature(s) Signed: 05/17/2016 4:06:31 PM By: Christin Fudge MD, FACS Previous Signature: 05/17/2016 3:14:15 PM Version By: Christin Fudge MD, FACS Previous Signature: 05/17/2016 3:11:09 PM Version By: Christin Fudge MD, FACS Entered By: Christin Fudge on 05/17/2016 16:06:31 Hannay, Jolene Schimke ([id number]) -------------------------------------------------------------------------------- Physical Exam Details Patient Name: [name], [name] Date of Service: 05/17/2016 2:15 PM Medical Record Number: [id number] Patient Account Number: [id number] Date of Birth/Sex: Aug 08, 1961 (55 y.o. Male) Treating RN: Cornell Barman Primary Care Physician: [name] Other  Clinician: Referring Physician: [name] Treating Physician/Extender: Frann Rider in Treatment: 0 Constitutional . Pulse regular. Respirations normal and unlabored. Afebrile. . Eyes Nonicteric. Reactive to light. Ears, Nose, Mouth, and Throat Lips, teeth, and gums  WNL.. Moist mucosa without lesions. Neck supple and nontender. No palpable supraclavicular or cervical adenopathy. Normal sized without goiter. Respiratory WNL. No retractions.. Cardiovascular Pedal Pulses WNL. bilateral lower extremity pulses are noncompressible and ABI could not be measured. he has typical signs of stasis disease of both lower extremities with lymphedema. Chest Breasts symmetical and no nipple discharge.. Breast tissue WNL, no masses, lumps, or tenderness.. Gastrointestinal (GI) Abdomen without masses or tenderness.. No liver or spleen enlargement or tenderness.. Lymphatic No adneopathy. No adenopathy. No adenopathy. Musculoskeletal Adexa without tenderness or enlargement.. Digits and nails w/o clubbing, cyanosis, infection, petechiae, ischemia, or inflammatory conditions.. Integumentary (Hair, Skin) No suspicious lesions. No crepitus or fluctuance. No peri-wound warmth or erythema. No masses.Marland Kitchen Psychiatric Judgement and insight Intact.. No evidence of depression, anxiety, or agitation.. Notes the patient was examined in the sitting and standing position and he has signs and symptoms of venous hypertension, varicose veins and stasis ulceration and scars from the past. He also has a typical hemosiderin deposits both lower extremities. At the present time there are no open wounds and there is no active bleeding Electronic Signature(s) Signed: 05/17/2016 4:07:50 PM By: Christin Fudge MD, FACS Balin, [place] ([id number]) Entered By: Christin Fudge on 05/17/2016 16:07:49 Freeland, Jolene Schimke  ([id number]) -------------------------------------------------------------------------------- Physician Orders Details Patient Name: [name], [name] Date of Service: 05/17/2016 2:15 PM Medical Record Number: [id number] Patient Account Number: [id number] Date of Birth/Sex: 11/06/1961 (55 y.o. Male) Treating RN: Cornell Barman Primary Care Physician: [name] Other Clinician: Referring Physician: [name] Treating Physician/Extender: Frann Rider in Treatment: 0 Verbal / Phone Orders: Yes Clinician: Cornell Barman Read Back and Verified: Yes Diagnosis Coding Edema Control o Support Garment 20-30 mm/Hg pressure to: - Lower extremities Additional Orders / Instructions Wound #1 Right,Medial Foot o Activity as tolerated Consults o Vascular - Arterial studies and venous studies Electronic Signature(s) Signed: 05/17/2016 4:13:37 PM By: Christin Fudge MD, FACS Signed: 05/17/2016 5:08:33 PM By: Gretta Cool RN, BSN, Kim RN, BSN Entered By: Gretta Cool, RN, BSN, Kim on 05/17/2016 15:31:09 Geron, Jolene Schimke ([id number]) -------------------------------------------------------------------------------- Problem List Details Patient Name: Zeiders, Orin T. Date of Service: 05/17/2016 2:15 PM Medical Record Number: [id number] Patient Account Number: [id number] Date of Birth/Sex: 11-03-1961 (55 y.o. Male) Treating RN: Cornell Barman Primary Care Physician: [name] Other Clinician: Referring Physician: [name] Treating Physician/Extender: Frann Rider in Treatment: 0 Active Problems ICD-10 Encounter Code Description Active Date Diagnosis E11.622 Type 2 diabetes mellitus with other skin ulcer 05/17/2016 Yes I87.301 Chronic venous hypertension (idiopathic) without XX123456 Yes complications of right lower extremity I87.302 Chronic venous hypertension (idiopathic) without XX123456 Yes complications of left lower extremity E66.01 Morbid (severe) obesity due to excess  calories 05/17/2016 Yes Inactive Problems Resolved Problems Electronic Signature(s) Signed: 05/17/2016 4:03:10 PM By: Christin Fudge MD, FACS Entered By: Christin Fudge on 05/17/2016 16:03:09 Stargell, Jolene Schimke ([id number]) -------------------------------------------------------------------------------- Progress Note Details Patient Name: Rodier, Symir T. Date of Service: 05/17/2016 2:15 PM Medical Record Number: [id number] Patient Account Number: [id number] Date of Birth/Sex: 06/17/1961 (55 y.o. Male) Treating RN: Cornell Barman Primary Care Physician: [name] Other Clinician: Referring Physician: [name] Treating Physician/Extender: Frann Rider in Treatment: 0 Subjective Chief Complaint Information obtained from Patient Patient presents for treatment of a bleeding ulcer due to venous insufficiency, on his right foot medially which she's had on and off for the last 2 months. History of Present Illness (HPI) The following HPI elements were documented for the patient's wound: Location: ulcerated area on the right foot medially  which bleeds profusely Quality: Patient reports experiencing a dull pain to affected area(s). Severity: Patient states wound are getting worse. Duration: Patient has had the wound for > 2 months prior to seeking treatment at the wound center Context: The wound appeared gradually over time Modifying Factors: Other treatment(s) tried include:elevation and pressure to stop the bleeding Associated Signs and Symptoms: Patient reports having difficulty standing for long periods. 55 year old gentleman who was seen by his PCP for essential hypertension, GERD, colon polyps, hypercholesterolemia, obesity, diabetes mellitus with peripheral circulatory disorder, venous stasis ulcer.Last hemoglobin A1c checked was 5.5 and 3 months and it was 5.8 in 8 months ago it was 5.9. Recently the patient complained of a wound on his leg which has been bleeding for a  while and though he was not seen by his PCP they referred him to the wound center for evaluation. Past medical history significant for depression, hypertension, venous stasis disease, diabetes mellitus, status post cholecystectomy, status post hernia repair. he has never been a smoker. Review of his electronic medical records do not reveal any vascular workup done in the past. the patient has kept a accurate log of having the problem start on May 16 of this year when the foot started bleeding profusely and it had to be treated with pressure. He had similar problems in the middle of June and on 2 separate occasions it bled profusely. He is also had swelling of the ankles and cramps in his legs. He had a similar problem of bleeding in July 2012 and had to be transported to the ER and by that time the bleeding had stopped. She never had a vascular workup done but has been treated with compression stockings in the past Wound History Patient reportedly has not tested positive for osteomyelitis. Patient reportedly has not had testing performed to evaluate circulation in the legs. Patient History Information obtained from Patient, Chart. Redner, YAHSIR BALLERINI ([id number]) Allergies No Known Drug Allergies Family History Cancer - Mother, Paternal Grandparents, Diabetes - Mother, Siblings, Paternal Grandparents, Father, Heart Disease - Paternal Grandparents, Father, Maternal Grandparents, Hypertension - Mother, Father, Siblings, Paternal Grandparents, Maternal Grandparents, Maternal Grandparents, Stroke - Paternal Grandparents, Maternal Grandparents, Thyroid Problems - Paternal Grandparents. Social History Never smoker, Marital Status - Single, Alcohol Use - Rarely, Drug Use - No History, Caffeine Use - Daily. Medical History Eyes Denies history of Cataracts, Glaucoma, Optic Neuritis Ear/Nose/Mouth/Throat Denies history of Chronic sinus problems/congestion, Middle ear  problems Hematologic/Lymphatic Denies history of Anemia, Hemophilia, Human Immunodeficiency Virus, Lymphedema, Sickle Cell Disease Respiratory Denies history of Aspiration, Asthma, Chronic Obstructive Pulmonary Disease (COPD), Pneumothorax, Sleep Apnea, Tuberculosis Cardiovascular Patient has history of Hypertension Denies history of Angina, Arrhythmia, Congestive Heart Failure, Coronary Artery Disease, Deep Vein Thrombosis, Hypotension, Myocardial Infarction, Peripheral Arterial Disease, Peripheral Venous Disease, Phlebitis, Vasculitis Gastrointestinal Denies history of Cirrhosis , Colitis, Crohn s, Hepatitis A, Hepatitis B, Hepatitis C Endocrine Patient has history of Type II Diabetes Denies history of Type I Diabetes Genitourinary Denies history of End Stage Renal Disease Immunological Denies history of Lupus Erythematosus, Raynaud s, Scleroderma Integumentary (Skin) Denies history of History of Burn Musculoskeletal Denies history of Gout, Rheumatoid Arthritis, Osteoarthritis, Osteomyelitis Neurologic Patient has history of Neuropathy Denies history of Dementia, Quadriplegia, Paraplegia, Seizure Disorder Oncologic Denies history of Received Chemotherapy, Received Radiation Psychiatric Denies history of Anorexia/bulimia, Confinement Anxiety Patient is treated with Controlled Diet. Kuenzi, BECKAM LOBELLO ([id number]) Medical And Surgical History Notes Constitutional Symptoms (General Health) HTN; Diabetes controlled with diet; lower Back pain Review of  Systems (ROS) Constitutional Symptoms (General Health) The patient has no complaints or symptoms. Eyes Complains or has symptoms of Glasses / Contacts. Denies complaints or symptoms of Dry Eyes, Vision Changes. Hematologic/Lymphatic The patient has no complaints or symptoms. Respiratory The patient has no complaints or symptoms. Cardiovascular Complains or has symptoms of LE edema - occasional. Gastrointestinal The patient  has no complaints or symptoms. Endocrine Denies complaints or symptoms of Hepatitis, Thyroid disease, Polydypsia (Excessive Thirst). Genitourinary The patient has no complaints or symptoms. Immunological The patient has no complaints or symptoms. Integumentary (Skin) Complains or has symptoms of Bleeding or bruising tendency. Denies complaints or symptoms of Wounds, Breakdown, Swelling. Musculoskeletal The patient has no complaints or symptoms. Neurologic The patient has no complaints or symptoms. Oncologic The patient has no complaints or symptoms. Psychiatric The patient has no complaints or symptoms. Medications Claritin oral unspecified oral as needed Zantac oral unspecified oral as needed lisinopril 20 mg tablet oral 1 1 tablet oral daily Aleve 220 mg tablet oral 2 2 tablet oral daily Holts, Dushawn T. ([id number]) Objective Constitutional Pulse regular. Respirations normal and unlabored. Afebrile. Vitals Time Taken: 2:50 PM, Height: 73 in, Source: Stated, Weight: 266 lbs, Source: Stated, BMI: 35.1, Temperature: 98.7 F, Pulse: 67 bpm, Respiratory Rate: 18 breaths/min, Blood Pressure: 149/77 mmHg. Eyes Nonicteric. Reactive to light. Ears, Nose, Mouth, and Throat Lips, teeth, and gums WNL.Marland Kitchen Moist mucosa without lesions. Neck supple and nontender. No palpable supraclavicular or cervical adenopathy. Normal sized without goiter. Respiratory WNL. No retractions.. Cardiovascular Pedal Pulses WNL. bilateral lower extremity pulses are noncompressible and ABI could not be measured. he has typical signs of stasis disease of both lower extremities with lymphedema. Chest Breasts symmetical and no nipple discharge.. Breast tissue WNL, no masses, lumps, or tenderness.. Gastrointestinal (GI) Abdomen without masses or tenderness.. No liver or spleen enlargement or tenderness.. Lymphatic No adneopathy. No adenopathy. No adenopathy. Musculoskeletal Adexa without tenderness or  enlargement.. Digits and nails w/o clubbing, cyanosis, infection, petechiae, ischemia, or inflammatory conditions.Marland Kitchen Psychiatric Judgement and insight Intact.. No evidence of depression, anxiety, or agitation.. General Notes: the patient was examined in the sitting and standing position and he has signs and symptoms of venous hypertension, varicose veins and stasis ulceration and scars from the past. He also has a typical hemosiderin deposits both lower extremities. At the present time there are no open wounds and there is no active bleeding Integumentary (Hair, Skin) No suspicious lesions. No crepitus or fluctuance. No peri-wound warmth or erythema. No masses.Marland Kitchen Elman, Johnattan T. ([id number]) Wound #1 status is Open. Original cause of wound was Gradually Appeared. The wound is located on the Right,Medial Foot. The wound measures 0.1cm length x 0.1cm width x 0.1cm depth; 0.008cm^2 area and 0.001cm^3 volume. The wound is limited to skin breakdown. There is no tunneling or undermining noted. There is a none present amount of drainage noted. The wound margin is flat and intact. There is large (67- 100%) granulation within the wound bed. There is no necrotic tissue within the wound bed. The periwound skin appearance did not exhibit: Callus, Crepitus, Excoriation, Fluctuance, Friable, Induration, Localized Edema, Rash, Scarring, Dry/Scaly, Maceration, Moist, Atrophie Blanche, Cyanosis, Ecchymosis, Hemosiderin Staining, Mottled, Pallor, Rubor, Erythema. Assessment Active Problems ICD-10 E11.622 - Type 2 diabetes mellitus with other skin ulcer I87.301 - Chronic venous hypertension (idiopathic) without complications of right lower extremity I87.302 - Chronic venous hypertension (idiopathic) without complications of left lower extremity E66.01 - Morbid (severe) obesity due to excess calories This 55 year old gentleman who has diet  controlled diabetes mellitus,( hemoglobin A1c done recently was 5.5),  morbid obesity, ( has lost about 80 pounds of weight), has signs and symptoms of typical venous hypertension bilaterally due to varicose veins. He has had several episodes of a profuse bleeding from an ulcer which has been small and this has healed spontaneously. After a thorough review and examination I have recommended: 1. Arterial and venous duplex studies to be done as soon as possible 2. in the meanwhile he will wear compression stockings of 30-40 mm variety on a daily basis, except at bedtime 3. If bleeding occurs again from the punctate wound on his right lower extremity and foot I would recommend compression and elevation and going to the ER if need arises 4. Discussed the pathophysiology of venous hypertension and vein varicosities which need surgery, possibly with endovenous ablation He and his partner have had several questions answered to their satisfaction and will see me back after the test results Plan Edema Control: Lomas, Rudolpho T. ([id number]) Support Garment 20-30 mm/Hg pressure to: - Lower extremities Additional Orders / Instructions: Wound #1 Right,Medial Foot: Activity as tolerated Consults ordered were: Vascular - Arterial studies and venous studies This 56 year old gentleman who has diet controlled diabetes mellitus,( hemoglobin A1c done recently was 5.5), morbid obesity, ( has lost about 80 pounds of weight), has signs and symptoms of typical venous hypertension bilaterally due to varicose veins. He has had several episodes of a profuse bleeding from an ulcer which has been small and this has healed spontaneously. After a thorough review and examination I have recommended: 1. Arterial and venous duplex studies to be done as soon as possible 2. in the meanwhile he will wear compression stockings of 30-40 mm variety on a daily basis, except at bedtime 3. If bleeding occurs again from the punctate wound on his right lower extremity and foot I would recommend  compression and elevation and going to the ER if need arises 4. Discussed the pathophysiology of venous hypertension and vein varicosities which need surgery, possibly with endovenous ablation He and his partner have had several questions answered to their satisfaction and will see me back after the test results Electronic Signature(s) Signed: 05/17/2016 4:12:45 PM By: Christin Fudge MD, FACS Entered By: Christin Fudge on 05/17/2016 16:12:45 Fanelli, Jolene Schimke ([id number]) -------------------------------------------------------------------------------- ROS/PFSH Details Patient Name: Graef, Travarus T. Date of Service: 05/17/2016 2:15 PM Medical Record Number: [id number] Patient Account Number: [id number] Date of Birth/Sex: [birth year] (55 y.o. Male) Treating RN: Cornell Barman Primary Care Physician: [name] Other Clinician: Referring Physician: [name] Treating Physician/Extender: Frann Rider in Treatment: 0 Information Obtained From Patient Chart Wound History Do you currently have one or more open woundso No Have you tested positive for osteomyelitis (bone infection)o No Have you had any tests for circulation on your legso No Eyes Complaints and Symptoms: Positive for: Glasses / Contacts Negative for: Dry Eyes; Vision Changes Medical History: Negative for: Cataracts; Glaucoma; Optic Neuritis Hematologic/Lymphatic Complaints and Symptoms: No Complaints or Symptoms Complaints and Symptoms: Negative for: Bleeding / Clotting Disorders; Human Immunodeficiency Virus Medical History: Negative for: Anemia; Hemophilia; Human Immunodeficiency Virus; Lymphedema; Sickle Cell Disease Cardiovascular Complaints and Symptoms: Positive for: LE edema - occasional Medical History: Positive for: Hypertension Negative for: Angina; Arrhythmia; Congestive Heart Failure; Coronary Artery Disease; Deep Vein Thrombosis; Hypotension; Myocardial Infarction; Peripheral Arterial  Disease; Peripheral Venous Disease; Phlebitis; Vasculitis Endocrine Complaints and Symptoms: Negative for: Hepatitis; Thyroid disease; Polydypsia (Excessive Thirst) Welte, Aaren T. ([id number]) Medical History: Positive for: Type II  Diabetes Negative for: Type I Diabetes Time with diabetes: 2009 Treated with: Diet Immunological Complaints and Symptoms: No Complaints or Symptoms Complaints and Symptoms: Negative for: Hives; Itching Medical History: Negative for: Lupus Erythematosus; Raynaudos; Scleroderma Integumentary (Skin) Complaints and Symptoms: Positive for: Bleeding or bruising tendency Negative for: Wounds; Breakdown; Swelling Medical History: Negative for: History of Burn Constitutional Symptoms (General Health) Complaints and Symptoms: No Complaints or Symptoms Medical History: Past Medical History Notes: HTN; Diabetes controlled with diet; lower Back pain Ear/Nose/Mouth/Throat Medical History: Negative for: Chronic sinus problems/congestion; Middle ear problems Respiratory Complaints and Symptoms: No Complaints or Symptoms Medical History: Negative for: Aspiration; Asthma; Chronic Obstructive Pulmonary Disease (COPD); Pneumothorax; Sleep Apnea; Tuberculosis Gastrointestinal Dart, Arron T. ([id number]) Complaints and Symptoms: No Complaints or Symptoms Medical History: Negative for: Cirrhosis ; Colitis; Crohnos; Hepatitis A; Hepatitis B; Hepatitis C Genitourinary Complaints and Symptoms: No Complaints or Symptoms Medical History: Negative for: End Stage Renal Disease Musculoskeletal Complaints and Symptoms: No Complaints or Symptoms Medical History: Negative for: Gout; Rheumatoid Arthritis; Osteoarthritis; Osteomyelitis Neurologic Complaints and Symptoms: No Complaints or Symptoms Medical History: Positive for: Neuropathy Negative for: Dementia; Quadriplegia; Paraplegia; Seizure Disorder Oncologic Complaints and Symptoms: No Complaints or  Symptoms Medical History: Negative for: Received Chemotherapy; Received Radiation Psychiatric Complaints and Symptoms: No Complaints or Symptoms Medical History: Negative for: Anorexia/bulimia; Confinement Anxiety Family and Social History Cancer: Yes - Mother, Paternal Grandparents; Diabetes: Yes - Mother, Siblings, Paternal Grandparents, Father; Heart Disease: Yes - Paternal Grandparents, Father, Maternal Grandparents; Hypertension: Yes - Mother, Father, Siblings, Paternal Grandparents, Maternal Grandparents, Maternal Grandparents; Stroke: TIMOTHY, HOSTEN. ([id number]) Yes - Paternal Grandparents, Maternal Grandparents; Thyroid Problems: Yes - Paternal Grandparents; Never smoker; Marital Status - Single; Alcohol Use: Rarely; Drug Use: No History; Caffeine Use: Daily; Advanced Directives: No; Patient does not want information on Advanced Directives; Living Will: No; Medical Power of Attorney: No Electronic Signature(s) Signed: 05/17/2016 4:13:37 PM By: Christin Fudge MD, FACS Signed: 05/17/2016 5:08:33 PM By: Gretta Cool RN, BSN, Kim RN, BSN Entered By: Gretta Cool, RN, BSN, Kim on 05/17/2016 15:09:39 Metzner, Jolene Schimke ([id number]) -------------------------------------------------------------------------------- [place] Details Patient Name: Dockter, Augustine T. Date of Service: 05/17/2016 Medical Record Number: [id number] Patient Account Number: [id number] Date of Birth/Sex: 1961-03-24 (54 y.o. Male) Treating RN: Cornell Barman Primary Care Physician: [name] Other Clinician: Referring Physician: [name] Treating Physician/Extender: Frann Rider in Treatment: 0 Diagnosis Coding ICD-10 Codes Code Description E11.622 Type 2 diabetes mellitus with other skin ulcer I87.301 Chronic venous hypertension (idiopathic) without complications of right lower extremity I87.302 Chronic venous hypertension (idiopathic) without complications of left lower extremity E66.01 Morbid (severe)  obesity due to excess calories Facility Procedures CPT4 Code: [id number] Description: 99213 - WOUND CARE VISIT-LEV 3 EST PT Modifier: Quantity: 1 Physician Procedures CPT4: Description Modifier Quantity Code [id number] - WC PHYS LEVEL 4 - NEW PT 1 ICD-10 Description Diagnosis E11.622 Type 2 diabetes mellitus with other skin ulcer I87.301 Chronic venous hypertension (idiopathic) without complications of right lower  extremity I87.302 Chronic venous hypertension (idiopathic) without complications of left lower extremity E66.01 Morbid (severe) obesity due to excess calories Electronic Signature(s) Signed: 05/17/2016 4:13:02 PM By: Christin Fudge MD, FACS Entered By: Christin Fudge on 05/17/2016 16:13:02

## 2016-05-24 ENCOUNTER — Ambulatory Visit (INDEPENDENT_AMBULATORY_CARE_PROVIDER_SITE_OTHER): Payer: BLUE CROSS/BLUE SHIELD | Admitting: Internal Medicine

## 2016-05-24 ENCOUNTER — Encounter: Payer: Self-pay | Admitting: Internal Medicine

## 2016-05-24 VITALS — BP 140/70 | HR 73 | Temp 98.4°F | Resp 18 | Ht 70.0 in | Wt 274.0 lb

## 2016-05-24 DIAGNOSIS — E1351 Other specified diabetes mellitus with diabetic peripheral angiopathy without gangrene: Secondary | ICD-10-CM

## 2016-05-24 DIAGNOSIS — E7800 Pure hypercholesterolemia, unspecified: Secondary | ICD-10-CM

## 2016-05-24 DIAGNOSIS — I10 Essential (primary) hypertension: Secondary | ICD-10-CM | POA: Diagnosis not present

## 2016-05-24 DIAGNOSIS — E559 Vitamin D deficiency, unspecified: Secondary | ICD-10-CM

## 2016-05-24 DIAGNOSIS — K219 Gastro-esophageal reflux disease without esophagitis: Secondary | ICD-10-CM

## 2016-05-24 DIAGNOSIS — I83009 Varicose veins of unspecified lower extremity with ulcer of unspecified site: Secondary | ICD-10-CM

## 2016-05-24 DIAGNOSIS — D649 Anemia, unspecified: Secondary | ICD-10-CM

## 2016-05-24 DIAGNOSIS — E669 Obesity, unspecified: Secondary | ICD-10-CM

## 2016-05-24 DIAGNOSIS — L97909 Non-pressure chronic ulcer of unspecified part of unspecified lower leg with unspecified severity: Secondary | ICD-10-CM

## 2016-05-24 NOTE — Progress Notes (Signed)
Pre-visit discussion using our clinic review tool. No additional management support is needed unless otherwise documented below in the visit note.  

## 2016-05-24 NOTE — Patient Instructions (Signed)
Vitamin D3 1000 units per day 

## 2016-05-24 NOTE — Progress Notes (Signed)
[name], male   DOB: 09/22/1961, 55 y.o.   MRN: [id number]   Subjective:    [name], male    DOB: March 09, 1961, 55 y.o.   MRN: [id number]  HPI  Patient here for a scheduled follow up.  He has had problems with lower extremity venous insufficiency, open bleeding wound/vessel.  Seen at wound clinic.  Referred to vascular surgery.  Has lower extremity ultrasound 06/09/16 and f/u with vascular 06/21/16.  Bleeding has stopped.  Some foot pain.  Pain noticed more when first gets out of bed or if has been sitting for a while and then gets up.  Once moving, better.  No chest pain.  No sob.  No abdominal pain or cramping.  No bowel change.  Discussed lab results.     Past Medical History  Diagnosis Date  . History of chicken pox   . Depression   . Environmental allergies   . Hypertension   . Vitamin D deficiency   . Venous stasis   . Family history of colon cancer   . GERD (gastroesophageal reflux disease)   . 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 Cholecystectomy  2002  . Hernia repair  1962  . Colonoscopy with propofol N/A 06/27/2015    Procedure: COLONOSCOPY WITH PROPOFOL;  Surgeon: Lollie Sails, MD;  Location: [facility] ENDOSCOPY;  Service: Endoscopy;  Laterality: N/A;   Family History  Problem Relation Age of Onset  . Stomach cancer Mother   . Arthritis Mother   . Diabetes Mother   . Hypertension Mother   . Hyperlipidemia Father   . Heart disease Father   . Diabetes Father   . Hypertension Father   . Diabetes Sister   . Arthritis Maternal Grandmother   . Hyperlipidemia Maternal Grandmother   . Hypertension Maternal Grandmother   . Arthritis Maternal Grandfather   . Hyperlipidemia Maternal Grandfather   . Heart disease Maternal Grandfather   . Hypertension Maternal Grandfather   . Arthritis Paternal  Grandmother   . Breast cancer Paternal Grandmother   . Hypertension Paternal Grandmother   . Arthritis Paternal Grandfather   . Hyperlipidemia Paternal Grandfather   . Heart disease Paternal Grandfather   . Diabetes Paternal Grandfather   . Hypertension Paternal Grandfather    Social History   Social History  . Marital Status: Widowed    Spouse Name: N/A  . Number of Children: N/A  . Years of Education: N/A   Social History Main Topics  . Smoking status: Never Smoker   . Smokeless tobacco: Never Used  . Alcohol Use: 0.0 oz/week    0 Standard drinks or equivalent per week     Comment: 1 drink every 2 months  . Drug Use: No  . Sexual Activity: Not Asked   Other Topics Concern  . None   Social History Narrative    Outpatient Encounter Prescriptions as of 05/24/2016  Medication Sig  . lisinopril (PRINIVIL,ZESTRIL) 20 MG tablet Take 1 tablet (20 mg total) by mouth daily.  Marland Kitchen loratadine (CLARITIN) 10 MG tablet Take 10 mg by mouth daily as needed for allergies.  . ranitidine (ZANTAC) 150 MG tablet Take 150 mg by mouth at bedtime as needed for heartburn.  . triamcinolone cream (KENALOG) 0.1 % Apply 1 application topically 2 (two) times daily.   No facility-administered  encounter medications on file as of 05/24/2016.    Review of Systems  Constitutional: Negative for appetite change and unexpected weight change.  HENT: Negative for congestion and sinus pressure.   Respiratory: Negative for cough, chest tightness and shortness of breath.   Cardiovascular: Positive for leg swelling. Negative for chest pain and palpitations.  Gastrointestinal: Negative for nausea, vomiting, abdominal pain and diarrhea.  Genitourinary: Negative for dysuria and difficulty urinating.  Musculoskeletal: Negative for back pain and joint swelling.       Foot pain as outlined.    Skin: Negative for color change and rash.  Neurological: Negative for dizziness, light-headedness and headaches.    Hematological:       Venous insufficiency and bleeding as outlined.    Psychiatric/Behavioral: Negative for dysphoric mood and agitation.       Objective:     Blood pressure rechecked by me:  134/78  Physical Exam  Constitutional: He appears well-developed and well-nourished. No distress.  HENT:  Nose: Nose normal.  Mouth/Throat: Oropharynx is clear and moist.  Neck: Neck supple. No thyromegaly present.  Cardiovascular: Normal rate and regular rhythm.   Pulmonary/Chest: Effort normal and breath sounds normal. No respiratory distress.  Abdominal: Soft. Bowel sounds are normal. There is no tenderness.  Musculoskeletal:  No significant increased swelling currently.  Venous insufficiency with varicose veins.  Healing lesion.  No active bleeding.  No pain.  DP pulses palpable and equal bialterally.    Lymphadenopathy:    He has no cervical adenopathy.  Skin: No rash noted. No erythema.  Psychiatric: He has a normal mood and affect. His behavior is normal.    BP 140/70 mmHg  Pulse 73  Temp(Src) 98.4 F (36.9 C) (Oral)  Resp 18  Ht 5' 10" (1.778 m)  Wt 274 lb (124.286 kg)  BMI 39.32 kg/m2  SpO2 95% Wt Readings from Last 3 Encounters:  05/24/16 274 lb (124.286 kg)  02/23/16 271 lb 8 oz (123.152 kg)  11/18/15 274 lb 8 oz (124.512 kg)     Lab Results  Component Value Date   WBC 7.0 05/07/2016   HGB 12.2* 05/07/2016   HCT 36* 05/07/2016   PLT 250 05/07/2016   GLUCOSE 116* 05/06/2016   CHOL 175 05/07/2016   TRIG 57 05/07/2016   HDL 47 05/07/2016   LDLCALC 117 05/07/2016   ALT 16 05/07/2016   AST 18 05/07/2016   NA 142 05/07/2016   K 3.9 05/07/2016   CL 101 05/06/2016   CREATININE 0.9 05/07/2016   BUN 25* 05/07/2016   CO2 24 05/06/2016   TSH 1.49 05/07/2016   HGBA1C 5.5 05/07/2016    Dg Lumbar Spine 2-3 Views  09/17/2015  CLINICAL DATA:  Lower back pain. EXAM: LUMBAR SPINE - 2-3 VIEW COMPARISON:  None. FINDINGS: Multilevel spondylosis noted throughout the  visualized spine. Moderate proliferative changes are present at T12-L1 and L1-L2. There is suggestion of a minimal retrolisthesis of L2 on L3 of approximately 4-5 mm. Mild proliferative changes are present at L3-4. Moderate facet hypertrophy present at L4-5 and L5-S1. No acute fracture identified. No bony lesions are seen. 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 There is mild retrolisthesis of L2 on L3 by approximately 4-5 mm. Electronically Signed   By: Aletta Edouard M.D.   On: 09/17/2015 09:02       Assessment & Plan:   Problem List Items Addressed This Visit    Anemia    Hgb slightly decreased.  Had the  bleeding as outlined.  Recheck cbc and iron studies and B12.        Relevant Orders   CBC with Differential/Platelet   Ferritin   Vitamin B12   IBC panel   Essential hypertension    Blood pressure as outlined.  Continue current medication regimen.  Follow pressures.  Follow metabolic panel.       GERD (gastroesophageal reflux disease)    No upper symptoms reported.  Follow.       Hypercholesterolemia    Low cholesterol diet and exercise.  Follow lipid panel.  LDL 117.  Follow.       Obesity    Diet and exercise.  Follow.        Secondary diabetes with peripheral circulatory disorder (HCC) - Primary    Sugar has been controlled.  Low carb diet and exercise.  Follow met b and a1c.       Venous stasis ulcer (HCC)    Venous stasis changes and bleeding as outlined.  Seeing wound center.  Referred to vascular surgery.  Bleeding stopped.  Follow.        Vitamin D deficiency    Vitamin D slightly decreased.  Start vitamin D3 1000 units per day.            [name], MD

## 2016-05-25 ENCOUNTER — Encounter: Payer: Self-pay | Admitting: Internal Medicine

## 2016-05-25 DIAGNOSIS — D649 Anemia, unspecified: Secondary | ICD-10-CM | POA: Insufficient documentation

## 2016-05-25 NOTE — Assessment & Plan Note (Signed)
Venous stasis changes and bleeding as outlined.  Seeing wound center.  Referred to vascular surgery.  Bleeding stopped.  Follow.

## 2016-05-25 NOTE — Assessment & Plan Note (Signed)
Low cholesterol diet and exercise.  Follow lipid panel.  LDL 117.  Follow.

## 2016-05-25 NOTE — Assessment & Plan Note (Signed)
Blood pressure as outlined.  Continue current medication regimen.  Follow pressures.  Follow metabolic panel.  

## 2016-05-25 NOTE — Assessment & Plan Note (Signed)
Vitamin D slightly decreased.  Start vitamin D3 1000 units per day.

## 2016-05-25 NOTE — Assessment & Plan Note (Signed)
Diet and exercise.  Follow.  

## 2016-05-25 NOTE — Assessment & Plan Note (Signed)
Sugar has been controlled.  Low carb diet and exercise.  Follow met b and a1c.

## 2016-05-25 NOTE — Assessment & Plan Note (Signed)
Hgb slightly decreased.  Had the bleeding as outlined.  Recheck cbc and iron studies and B12.

## 2016-05-25 NOTE — Assessment & Plan Note (Signed)
No upper symptoms reported.  Follow.   

## 2016-06-21 ENCOUNTER — Telehealth: Payer: Self-pay | Admitting: Internal Medicine

## 2016-06-21 NOTE — Telephone Encounter (Signed)
Pt called wanting to know if the word document can be faxed that he gave to Dr Nicki Reaper can be faxed to vein specialist fax number [phone]. Atten pt name. Thank you!  Call pt @ [phone].

## 2016-06-21 NOTE — Telephone Encounter (Signed)
Please advise, I am not sure which document he is referring to? thanks

## 2016-06-22 NOTE — Telephone Encounter (Signed)
The notes are scanned in under media.  Appeared to have been scanned in on 06/01/16.  Please send per pt request.  See attached message.

## 2016-06-22 NOTE — Telephone Encounter (Signed)
Faxed to number provided, thanks Notified patient via VM.

## 2016-07-02 ENCOUNTER — Encounter: Payer: Self-pay | Admitting: Internal Medicine

## 2016-07-02 ENCOUNTER — Encounter: Payer: Self-pay | Admitting: *Deleted

## 2016-07-02 LAB — CBC WITH DIFFERENTIAL/PLATELET
BASOS: 1 %
Basophils Absolute: 0.1 10*3/uL (ref 0.0–0.2)
EOS (ABSOLUTE): 0.1 10*3/uL (ref 0.0–0.4)
EOS: 2 %
HEMATOCRIT: 39.6 % (ref 37.5–51.0)
Hemoglobin: 12.3 g/dL — ABNORMAL LOW (ref 12.6–17.7)
Immature Grans (Abs): 0 10*3/uL (ref 0.0–0.1)
Immature Granulocytes: 0 %
LYMPHS ABS: 1.7 10*3/uL (ref 0.7–3.1)
Lymphs: 23 %
MCH: 26.4 pg — ABNORMAL LOW (ref 26.6–33.0)
MCHC: 31.1 g/dL — AB (ref 31.5–35.7)
MCV: 85 fL (ref 79–97)
MONOS ABS: 0.6 10*3/uL (ref 0.1–0.9)
Monocytes: 9 %
Neutrophils Absolute: 4.9 10*3/uL (ref 1.4–7.0)
Neutrophils: 65 %
Platelets: 268 10*3/uL (ref 150–379)
RBC: 4.66 x10E6/uL (ref 4.14–5.80)
RDW: 13.8 % (ref 12.3–15.4)
WBC: 7.4 10*3/uL (ref 3.4–10.8)

## 2016-07-02 LAB — CBC AND DIFFERENTIAL
HCT: 40 % — AB (ref 41–53)
Hemoglobin: 12.3 g/dL — AB (ref 13.5–17.5)
Neutrophils Absolute: 5 /uL
PLATELETS: 268 10*3/uL (ref 150–399)
WBC: 7.4 10*3/mL

## 2016-07-02 LAB — FERRITIN: Ferritin: 17 ng/mL — ABNORMAL LOW (ref 30–400)

## 2016-07-02 LAB — VITAMIN B12: Vitamin B-12: 330 pg/mL (ref 211–946)

## 2016-07-28 ENCOUNTER — Telehealth: Payer: Self-pay

## 2016-07-28 DIAGNOSIS — I10 Essential (primary) hypertension: Secondary | ICD-10-CM

## 2016-07-28 DIAGNOSIS — D649 Anemia, unspecified: Secondary | ICD-10-CM

## 2016-07-28 NOTE — Telephone Encounter (Signed)
Pt coming for repeat labs 07/29/16. Please place future orders. Thank you.

## 2016-07-28 NOTE — Telephone Encounter (Signed)
Orders placed for labs

## 2016-07-29 ENCOUNTER — Other Ambulatory Visit (INDEPENDENT_AMBULATORY_CARE_PROVIDER_SITE_OTHER): Payer: BLUE CROSS/BLUE SHIELD

## 2016-07-29 DIAGNOSIS — D649 Anemia, unspecified: Secondary | ICD-10-CM | POA: Diagnosis not present

## 2016-07-29 DIAGNOSIS — I10 Essential (primary) hypertension: Secondary | ICD-10-CM

## 2016-07-30 LAB — CBC WITH DIFFERENTIAL/PLATELET
BASOS: 1 %
Basophils Absolute: 0 10*3/uL (ref 0.0–0.2)
EOS (ABSOLUTE): 0.1 10*3/uL (ref 0.0–0.4)
Eos: 1 %
Hematocrit: 37.9 % (ref 37.5–51.0)
Hemoglobin: 12 g/dL — ABNORMAL LOW (ref 12.6–17.7)
IMMATURE GRANULOCYTES: 0 %
Immature Grans (Abs): 0 10*3/uL (ref 0.0–0.1)
LYMPHS ABS: 1.9 10*3/uL (ref 0.7–3.1)
Lymphs: 26 %
MCH: 26.1 pg — AB (ref 26.6–33.0)
MCHC: 31.7 g/dL (ref 31.5–35.7)
MCV: 82 fL (ref 79–97)
MONOS ABS: 0.7 10*3/uL (ref 0.1–0.9)
Monocytes: 9 %
NEUTROS PCT: 63 %
Neutrophils Absolute: 4.8 10*3/uL (ref 1.4–7.0)
PLATELETS: 291 10*3/uL (ref 150–379)
RBC: 4.6 x10E6/uL (ref 4.14–5.80)
RDW: 14.7 % (ref 12.3–15.4)
WBC: 7.4 10*3/uL (ref 3.4–10.8)

## 2016-07-30 LAB — BASIC METABOLIC PANEL
BUN / CREAT RATIO: 33 — AB (ref 9–20)
BUN: 27 mg/dL — ABNORMAL HIGH (ref 6–24)
CO2: 26 mmol/L (ref 18–29)
Calcium: 9.1 mg/dL (ref 8.7–10.2)
Chloride: 103 mmol/L (ref 96–106)
Creatinine, Ser: 0.83 mg/dL (ref 0.76–1.27)
GFR calc Af Amer: 115 mL/min/{1.73_m2} (ref >59)
GFR, EST NON AFRICAN AMERICAN: 99 mL/min/{1.73_m2} (ref >59)
GLUCOSE: 80 mg/dL (ref 65–99)
POTASSIUM: 3.6 mmol/L (ref 3.5–5.2)
SODIUM: 141 mmol/L (ref 134–144)

## 2016-07-30 LAB — FERRITIN: Ferritin: 16 ng/mL — ABNORMAL LOW (ref 30–400)

## 2016-08-02 ENCOUNTER — Encounter: Payer: Self-pay | Admitting: Internal Medicine

## 2016-08-23 ENCOUNTER — Telehealth: Payer: Self-pay | Admitting: *Deleted

## 2016-08-23 NOTE — Telephone Encounter (Signed)
Lm on vm to call back to reschedule appt

## 2016-08-23 NOTE — Telephone Encounter (Signed)
Please call pt to reschedule appt, he prefers afternoons at 4pm Pt contact  [phone]

## 2016-08-26 ENCOUNTER — Ambulatory Visit: Payer: BLUE CROSS/BLUE SHIELD | Admitting: Internal Medicine

## 2016-10-11 ENCOUNTER — Other Ambulatory Visit: Payer: Self-pay | Admitting: Internal Medicine

## 2016-10-12 ENCOUNTER — Ambulatory Visit: Payer: BLUE CROSS/BLUE SHIELD | Admitting: Internal Medicine

## 2016-10-21 ENCOUNTER — Ambulatory Visit (INDEPENDENT_AMBULATORY_CARE_PROVIDER_SITE_OTHER): Payer: BLUE CROSS/BLUE SHIELD | Admitting: Vascular Surgery

## 2016-10-25 ENCOUNTER — Ambulatory Visit (INDEPENDENT_AMBULATORY_CARE_PROVIDER_SITE_OTHER): Payer: BLUE CROSS/BLUE SHIELD | Admitting: Internal Medicine

## 2016-10-25 ENCOUNTER — Encounter: Payer: Self-pay | Admitting: Internal Medicine

## 2016-10-25 VITALS — BP 160/90 | HR 75 | Temp 98.4°F | Ht 70.0 in | Wt 279.8 lb

## 2016-10-25 DIAGNOSIS — IMO0001 Reserved for inherently not codable concepts without codable children: Secondary | ICD-10-CM

## 2016-10-25 DIAGNOSIS — Z6841 Body Mass Index (BMI) 40.0 and over, adult: Secondary | ICD-10-CM

## 2016-10-25 DIAGNOSIS — Z86010 Personal history of colonic polyps: Secondary | ICD-10-CM

## 2016-10-25 DIAGNOSIS — I10 Essential (primary) hypertension: Secondary | ICD-10-CM

## 2016-10-25 DIAGNOSIS — R0981 Nasal congestion: Secondary | ICD-10-CM

## 2016-10-25 DIAGNOSIS — D649 Anemia, unspecified: Secondary | ICD-10-CM

## 2016-10-25 DIAGNOSIS — E1351 Other specified diabetes mellitus with diabetic peripheral angiopathy without gangrene: Secondary | ICD-10-CM

## 2016-10-25 DIAGNOSIS — E669 Obesity, unspecified: Secondary | ICD-10-CM

## 2016-10-25 DIAGNOSIS — E7800 Pure hypercholesterolemia, unspecified: Secondary | ICD-10-CM

## 2016-10-25 DIAGNOSIS — I83008 Varicose veins of unspecified lower extremity with ulcer other part of lower leg: Secondary | ICD-10-CM | POA: Diagnosis not present

## 2016-10-25 DIAGNOSIS — L97809 Non-pressure chronic ulcer of other part of unspecified lower leg with unspecified severity: Secondary | ICD-10-CM

## 2016-10-25 NOTE — Progress Notes (Signed)
Pre visit review using our clinic review tool, if applicable. No additional management support is needed unless otherwise documented below in the visit note. 

## 2016-10-25 NOTE — Progress Notes (Signed)
[name], male   DOB: 1961/07/18, 55 y.o.   MRN: [id number]   Subjective:    [name], male    DOB: Sep 18, 1961, 55 y.o.   MRN: [id number]  HPI  Patient here for a scheduled follow up.   He reports he is doing well.  His leg swelling is better.  Wearing compression hose.  He has a new job.  Decreased stress.  No chest pain.  No sob.  No acid reflux.  No abdominal pain or cramping.  Bowels stable.  Having some drainage.  Minimal sore throat.  Just started.  Taking mucinex.  No vomiting or diarrhea.     Past Medical History:  Diagnosis Date  . 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 Depression   . Environmental allergies   . Family history of colon cancer   . GERD (gastroesophageal reflux disease)   . History of chicken pox   . Hypertension   . Venous stasis   . Vitamin D deficiency    Past Surgical History:  Procedure Laterality Date  . CHOLECYSTECTOMY  2002  . COLONOSCOPY WITH PROPOFOL N/A 06/27/2015   Procedure: COLONOSCOPY WITH PROPOFOL;  Surgeon: Lollie Sails, MD;  Location: [facility] ENDOSCOPY;  Service: Endoscopy;  Laterality: N/A;  . HERNIA REPAIR  1962   Family History  Problem Relation Age of Onset  . Stomach cancer Mother   . Arthritis Mother   . Diabetes Mother   . Hypertension Mother   . Hyperlipidemia Father   . Heart disease Father   . Diabetes Father   . Hypertension Father   . Arthritis Maternal Grandmother   . Hyperlipidemia Maternal Grandmother   . Hypertension Maternal Grandmother   . Arthritis Maternal Grandfather   . Hyperlipidemia Maternal Grandfather   . Heart disease Maternal Grandfather   . Hypertension Maternal Grandfather   . Arthritis Paternal Grandmother   . Breast cancer Paternal Grandmother   . Hypertension Paternal Grandmother   . Arthritis Paternal Grandfather   . Hyperlipidemia Paternal Grandfather   . Heart disease  Paternal Grandfather   . Diabetes Paternal Grandfather   . Hypertension Paternal Grandfather   . Diabetes Sister    Social History   Social History  . Marital status: Widowed    Spouse name: N/A  . Number of children: N/A  . Years of education: N/A   Social History Main Topics  . Smoking status: Never Smoker  . Smokeless tobacco: Never Used  . Alcohol use 0.0 oz/week     Comment: 1 drink every 2 months  . Drug use: No  . Sexual activity: Not Asked   Other Topics Concern  . None   Social History Narrative  . None    Outpatient Encounter Prescriptions as of 10/25/2016  Medication Sig  . lisinopril (PRINIVIL,ZESTRIL) 20 MG tablet TAKE ONE TABLET BY MOUTH ONCE DAILY  . loratadine (CLARITIN) 10 MG tablet Take 10 mg by mouth daily as needed for allergies.  . ranitidine (ZANTAC) 150 MG tablet Take 150 mg by mouth at bedtime as needed for heartburn.  . triamcinolone cream (KENALOG) 0.1 % Apply 1 application topically 2 (two) times daily.   No facility-administered encounter medications on file as of 10/25/2016.     Review of Systems  Constitutional: Negative for appetite change and unexpected weight change.  HENT: Positive for congestion and postnasal drip. Negative for sinus  pressure.   Respiratory: Negative for cough, chest tightness and shortness of breath.   Cardiovascular: Negative for chest pain and palpitations.       Leg swelling improved.    Gastrointestinal: Negative for abdominal pain, diarrhea, nausea and vomiting.  Genitourinary: Negative for difficulty urinating and dysuria.  Musculoskeletal: Negative for joint swelling and myalgias.  Skin: Negative for color change and rash.  Neurological: Negative for dizziness, light-headedness and headaches.  Psychiatric/Behavioral: Negative for agitation and dysphoric mood.       Objective:     Physical Exam  Constitutional: He appears well-developed and well-nourished. No distress.  HENT:  Nose: Nose normal.    Mouth/Throat: Oropharynx is clear and moist.  Neck: Neck supple. No thyromegaly present.  Cardiovascular: Normal rate and regular rhythm.   Pulmonary/Chest: Effort normal and breath sounds normal. No respiratory distress.  Abdominal: Soft. Bowel sounds are normal. There is no tenderness.  Musculoskeletal: He exhibits no tenderness.  Increased lower extremity swelling has improved.  Stasis changes present.    Lymphadenopathy:    He has no cervical adenopathy.  Skin: No rash noted. No erythema.  Stasis changes present.    Psychiatric: He has a normal mood and affect. His behavior is normal.    BP (!) 160/90   Pulse 75   Temp 98.4 F (36.9 C) (Oral)   Ht 5' 10"  (1.778 m)   Wt 279 lb 12.8 oz (126.9 kg)   SpO2 95%   BMI 40.15 kg/m  Wt Readings from Last 3 Encounters:  10/25/16 279 lb 12.8 oz (126.9 kg)  05/24/16 274 lb (124.3 kg)  02/23/16 271 lb 8 oz (123.2 kg)     Lab Results  Component Value Date   WBC 7.4 07/29/2016   HGB 12.3 (A) 07/02/2016   HCT 37.9 07/29/2016   PLT 291 07/29/2016   GLUCOSE 80 07/29/2016   CHOL 175 05/07/2016   TRIG 57 05/07/2016   HDL 47 05/07/2016   LDLCALC 117 05/07/2016   ALT 16 05/07/2016   AST 18 05/07/2016   NA 141 07/29/2016   K 3.6 07/29/2016   CL 103 07/29/2016   CREATININE 0.83 07/29/2016   BUN 27 (H) 07/29/2016   CO2 26 07/29/2016   TSH 1.49 05/07/2016   HGBA1C 5.5 05/07/2016    Dg Lumbar Spine 2-3 Views  Result Date: 09/17/2015 CLINICAL DATA:  Lower back pain. EXAM: LUMBAR SPINE - 2-3 VIEW COMPARISON:  None. FINDINGS: Multilevel spondylosis noted throughout the visualized spine. Moderate proliferative changes are present at T12-L1 and L1-L2. There is suggestion of a minimal retrolisthesis of L2 on L3 of approximately 4-5 mm. Mild proliferative changes are present at L3-4. Moderate facet hypertrophy present at L4-5 and L5-S1. No acute fracture identified. No bony lesions are seen. IMPRESSION: Multilevel spondylosis of the lower  thoracic and lumbar spines. There is mild retrolisthesis of L2 on L3 by approximately 4-5 mm. Electronically Signed   By: Aletta Edouard M.D.   On: 09/17/2015 09:02       Assessment & Plan:   Problem List Items Addressed This Visit    Anemia    hgb slightly decreased.  Follow cbc.  Had colonoscopy as outlined.        Essential hypertension    Blood pressure elevated today.  Recheck improved.  Continue same medication regimen for now.  Have him spot check his pressure.  Get him back in soon to reassess.        History of colonic polyps  Colonoscopy 06/27/15 - recommended f/u colonoscopy in five years.        Hypercholesterolemia    Low cholesterol diet and exercise.  Follow lipid panel.        Obesity - Primary    Diet and exercise.  Follow.       Secondary diabetes with peripheral circulatory disorder (HCC)    Sugars have been controlled.  Low carb diet.  Follow met b and a1c.        Venous stasis ulcer ([place])    Has seen vascular surgery.  Legs look better.  Swelling improved.  Follow.         Other Visit Diagnoses    Congestion of nasal sinus       some congestion and drainage as outlined.  mucinex as directed.  saline nasal spray as he is doing.  follow.         [name], MD

## 2016-10-25 NOTE — Patient Instructions (Signed)
Continue to flush your nose as you are doing  mucinex DM in the am and robitussin DM in the evening.    Rest.  Fluids.

## 2016-10-31 ENCOUNTER — Encounter: Payer: Self-pay | Admitting: Internal Medicine

## 2016-10-31 NOTE — Assessment & Plan Note (Signed)
hgb slightly decreased.  Follow cbc.  Had colonoscopy as outlined.

## 2016-10-31 NOTE — Assessment & Plan Note (Signed)
Sugars have been controlled.  Low carb diet.  Follow met b and a1c.

## 2016-10-31 NOTE — Assessment & Plan Note (Signed)
Blood pressure elevated today.  Recheck improved.  Continue same medication regimen for now.  Have him spot check his pressure.  Get him back in soon to reassess.

## 2016-10-31 NOTE — Assessment & Plan Note (Signed)
Colonoscopy 06/27/15 - recommended f/u colonoscopy in five years.

## 2016-10-31 NOTE — Assessment & Plan Note (Signed)
Low cholesterol diet and exercise.  Follow lipid panel.   

## 2016-10-31 NOTE — Assessment & Plan Note (Signed)
Controlled.  No upper symptoms reported.   

## 2016-10-31 NOTE — Assessment & Plan Note (Signed)
Diet and exercise.  Follow.  

## 2016-10-31 NOTE — Assessment & Plan Note (Signed)
Has seen vascular surgery.  Legs look better.  Swelling improved.  Follow.

## 2016-11-29 ENCOUNTER — Ambulatory Visit (INDEPENDENT_AMBULATORY_CARE_PROVIDER_SITE_OTHER): Payer: 59 | Admitting: Vascular Surgery

## 2016-11-29 ENCOUNTER — Encounter (INDEPENDENT_AMBULATORY_CARE_PROVIDER_SITE_OTHER): Payer: Self-pay | Admitting: Vascular Surgery

## 2016-11-29 VITALS — BP 170/84 | HR 67 | Resp 17 | Ht 73.0 in | Wt 290.4 lb

## 2016-11-29 DIAGNOSIS — E7800 Pure hypercholesterolemia, unspecified: Secondary | ICD-10-CM | POA: Diagnosis not present

## 2016-11-29 DIAGNOSIS — I872 Venous insufficiency (chronic) (peripheral): Secondary | ICD-10-CM | POA: Diagnosis not present

## 2016-11-29 DIAGNOSIS — I890 Lymphedema, not elsewhere classified: Secondary | ICD-10-CM

## 2016-11-29 DIAGNOSIS — I10 Essential (primary) hypertension: Secondary | ICD-10-CM

## 2016-11-29 NOTE — Progress Notes (Signed)
MRN : [id number]  [name] is a 56 y.o. (30-Jan-1961) male who presents with chief complaint of  Chief Complaint  Patient presents with  . Follow-up  .  History of Present Illness: The patient presents to the office for evaluation of her lower extremities and review of the noninvasive studies. There have been no recent changes in lower extremity symptoms. No interval shortening of the patient's claudication distance or development of rest pain symptoms. She is followed at the [facility] for several superficial wounds bilaterally. No recent infections no drainage.  There have been no significant changes to the patient's overall health care.  The patient denies amaurosis fugax or recent TIA symptoms. There are no recent neurological changes noted. The patient denies history of DVT, PE or superficial thrombophlebitis. The patient denies recent episodes of angina or shortness of breath.  Arterial Duplex shows diffuse ASO but no hemodynamically significant stenosis noted Triphasic signals noted bilaterally Duplex ultrasound of the lower extremities shows normal deep venous system, superficial reflux is present in the great saphenous veins bilaterally and in the left SFV.  Current Meds  Medication Sig  . lisinopril (PRINIVIL,ZESTRIL) 20 MG tablet TAKE ONE TABLET BY MOUTH ONCE DAILY  . loratadine (CLARITIN) 10 MG tablet Take 10 mg by mouth daily as needed for allergies.  . ranitidine (ZANTAC) 150 MG tablet Take 150 mg by mouth at bedtime as needed for heartburn.    Past Medical History:  Diagnosis Date  . 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 Depression   . Environmental allergies   . Family history of colon cancer   . GERD (gastroesophageal reflux disease)   . History of chicken pox   . Hypertension   . Venous stasis   . Vitamin D deficiency     Past Surgical History:  Procedure  Laterality Date  . CHOLECYSTECTOMY  2002  . COLONOSCOPY WITH PROPOFOL N/A 06/27/2015   Procedure: COLONOSCOPY WITH PROPOFOL;  Surgeon: Lollie Sails, MD;  Location: [facility] ENDOSCOPY;  Service: Endoscopy;  Laterality: N/A;  . HERNIA REPAIR  1962    Social History Social History  Substance Use Topics  . Smoking status: Never Smoker  . Smokeless tobacco: Never Used  . Alcohol use 0.0 oz/week     Comment: 1 drink every 2 months    Family History Family History  Problem Relation Age of Onset  . Stomach cancer Mother   . Arthritis Mother   . Diabetes Mother   . Hypertension Mother   . Hyperlipidemia Father   . Heart disease Father   . Diabetes Father   . Hypertension Father   . Arthritis Maternal Grandmother   . Hyperlipidemia Maternal Grandmother   . Hypertension Maternal Grandmother   . Arthritis Maternal Grandfather   . Hyperlipidemia Maternal Grandfather   . Heart disease Maternal Grandfather   . Hypertension Maternal Grandfather   . Arthritis Paternal Grandmother   . Breast cancer Paternal Grandmother   . Hypertension Paternal Grandmother   . Arthritis Paternal Grandfather   . Hyperlipidemia Paternal Grandfather   . Heart disease Paternal Grandfather   . Diabetes Paternal Grandfather   . Hypertension Paternal Grandfather   . Diabetes Sister   No family history of bleeding/clotting disorders, porphyria or autoimmune disease   No Known Allergies   REVIEW OF SYSTEMS (Negative unless checked)  Constitutional: [] Weight loss  [] Fever  [] Chills Cardiac: [] Chest pain   []   Chest pressure   [] Palpitations   [] Shortness of breath when laying flat   [] Shortness of breath with exertion. Vascular:  [] Pain in legs with walking   [] Pain in legs at rest  [] History of DVT   [] Phlebitis   [] Swelling in legs   [] Varicose veins   [] Non-healing ulcers Pulmonary:   [] Uses home oxygen   [] Productive cough   [] Hemoptysis   [] Wheeze  [] COPD   [] Asthma Neurologic:  [] Dizziness   [] Seizures    [] History of stroke   [] History of TIA  [] Aphasia   [] Vissual changes   [] Weakness or numbness in arm   [] Weakness or numbness in leg Musculoskeletal:   [] Joint swelling   [] Joint pain   [] Low back pain Hematologic:  [] Easy bruising  [] Easy bleeding   [] Hypercoagulable state   [] Anemic Gastrointestinal:  [] Diarrhea   [] Vomiting  [] Gastroesophageal reflux/heartburn   [] Difficulty swallowing. Genitourinary:  [] Chronic kidney disease   [] Difficult urination  [] Frequent urination   [] Blood in urine Skin:  [] Rashes   [] Ulcers  Psychological:  [] History of anxiety   []  History of major depression.  Physical Examination  Vitals:   11/29/16 1644  BP: (!) 170/84  Pulse: 67  Resp: 17  Weight: 290 lb 6.4 oz (131.7 kg)  Height: 6\' 1"  (1.854 m)   Body mass index is 38.31 kg/m. Gen: WD/WN, NAD Head: Kress/AT, No temporalis wasting.  Ear/Nose/Throat: Hearing grossly intact, nares w/o erythema or drainage, poor dentition Eyes: PER, EOMI, sclera nonicteric.  Neck: Supple, no masses.  No bruit or JVD.  Pulmonary:  Good air movement, clear to auscultation bilaterally, no use of accessory muscles.  Cardiac: RRR, normal S1, S2, no Murmurs. Vascular: moderate venous stasis dermatitis. No ulcers Vessel Right Left  Radial Palpable Palpable  Ulnar Palpable Palpable  Brachial Palpable Palpable  Carotid Palpable Palpable  Femoral Palpable Palpable  Popliteal Palpable Palpable  PT Palpable Palpable  DP Palpable Palpable   Gastrointestinal: soft, non-distended. No guarding/no peritoneal signs.  Musculoskeletal: M/S 5/5 throughout.  No deformity or atrophy.  Neurologic: CN 2-12 intact. Pain and light touch intact in extremities.  Symmetrical.  Speech is fluent. Motor exam as listed above. Psychiatric: Judgment intact, Mood & affect appropriate for pt's clinical situation. Dermatologic: No rashes or ulcers noted.  No changes consistent with cellulitis. Lymph : No Cervical lymphadenopathy, no  lichenification or skin changes of chronic lymphedema.  CBC Lab Results  Component Value Date   WBC 7.4 07/29/2016   HGB 12.3 (A) 07/02/2016   HCT 37.9 07/29/2016   MCV 82 07/29/2016   PLT 291 07/29/2016    BMET    Component Value Date/Time   NA 141 07/29/2016 1601   K 3.6 07/29/2016 1601   CL 103 07/29/2016 1601   CO2 26 07/29/2016 1601   GLUCOSE 80 07/29/2016 1601   BUN 27 (H) 07/29/2016 1601   CREATININE 0.83 07/29/2016 1601   CALCIUM 9.1 07/29/2016 1601   GFRNONAA 99 07/29/2016 1601   GFRAA 115 07/29/2016 1601   CrCl cannot be calculated (Patient's most recent lab result is older than the maximum 21 days allowed.).  COAG No results found for: INR, PROTIME  Radiology No results found.  Assessment/Plan 1. Chronic venous insufficiency No surgery or intervention at this point in time.    I have had a long discussion with the patient regarding venous insufficiency and why it  causes symptoms. I have discussed with the patient the chronic skin changes that accompany venous insufficiency and the long term  sequela such as infection and ulceration.  Patient will begin wearing graduated compression stockings class 1 (20-30 mmHg) or compression wraps on a daily basis a prescription was given. The patient will put the stockings on first thing in the morning and removing them in the evening. The patient is instructed specifically not to sleep in the stockings.    In addition, behavioral modification including several periods of elevation of the lower extremities during the day will be continued. I have demonstrated that proper elevation is a position with the ankles at heart level.  The patient is instructed to begin routine exercise, especially walking on a daily basis  Patient should undergo duplex ultrasound of the venous system to ensure that DVT or reflux is not present.  Following the review of the ultrasound the patient will follow up in 2-3 months to reassess the degree  of swelling and the control that graduated compression stockings or compression wraps  is offering.   The patient can be assessed for a Lymph Pump at that time  2. Lymphedema See #1  3. Essential hypertension Continue antihypertensive medications as already ordered, these medications have been reviewed and there are no changes at this time.  4. Hypercholesterolemia Continue statin as ordered and reviewed, no changes at this time    Hortencia Pilar, MD  11/29/2016 4:54 PM

## 2017-02-22 ENCOUNTER — Telehealth: Payer: Self-pay | Admitting: Internal Medicine

## 2017-02-22 DIAGNOSIS — E559 Vitamin D deficiency, unspecified: Secondary | ICD-10-CM

## 2017-02-22 DIAGNOSIS — E1351 Other specified diabetes mellitus with diabetic peripheral angiopathy without gangrene: Secondary | ICD-10-CM

## 2017-02-22 DIAGNOSIS — E7800 Pure hypercholesterolemia, unspecified: Secondary | ICD-10-CM

## 2017-02-22 DIAGNOSIS — D649 Anemia, unspecified: Secondary | ICD-10-CM

## 2017-02-22 DIAGNOSIS — I10 Essential (primary) hypertension: Secondary | ICD-10-CM

## 2017-02-22 NOTE — Telephone Encounter (Signed)
I can place order for labs to be drawn prior to his appt.  I think he gets these at [facility].  Let me know and I will place the order and he can go by fasting prior to appt.

## 2017-02-22 NOTE — Telephone Encounter (Signed)
No , labs since 9/17 Next appointment 02/25/17 at 4 does patient need labs prior?

## 2017-02-22 NOTE — Telephone Encounter (Signed)
Pt called and was wondering if her needs lab work done before his appt. Please advise, thank you!  Call pt @ [phone] (work)

## 2017-02-23 NOTE — Telephone Encounter (Signed)
Leftvoicemail to call  

## 2017-02-23 NOTE — Telephone Encounter (Signed)
I have placed the orders for him to go to [facility].  Needs to be fasting.  Please notify pt.  Thanks

## 2017-02-23 NOTE — Telephone Encounter (Signed)
Patient would like lab orders sent to [facility].

## 2017-02-23 NOTE — Telephone Encounter (Signed)
Pt called back stating yes needs to go to [facility]. Thank you!

## 2017-02-23 NOTE — Telephone Encounter (Signed)
Left message for patient to return call to office need to know if lab orders should go to [facility] and advise PCP so orders can be sent for draw .

## 2017-02-24 NOTE — Telephone Encounter (Signed)
Left voicemail to call received message

## 2017-02-24 NOTE — Telephone Encounter (Signed)
Spoke with patient he had labs drawn this fasting.

## 2017-02-24 NOTE — Telephone Encounter (Signed)
Pt led back returning your call. Thank you!  Call pt @ [phone]

## 2017-02-25 ENCOUNTER — Ambulatory Visit (INDEPENDENT_AMBULATORY_CARE_PROVIDER_SITE_OTHER): Payer: 59 | Admitting: Internal Medicine

## 2017-02-25 ENCOUNTER — Encounter: Payer: Self-pay | Admitting: Internal Medicine

## 2017-02-25 DIAGNOSIS — D649 Anemia, unspecified: Secondary | ICD-10-CM

## 2017-02-25 DIAGNOSIS — I83008 Varicose veins of unspecified lower extremity with ulcer other part of lower leg: Secondary | ICD-10-CM

## 2017-02-25 DIAGNOSIS — Z6841 Body Mass Index (BMI) 40.0 and over, adult: Secondary | ICD-10-CM | POA: Diagnosis not present

## 2017-02-25 DIAGNOSIS — IMO0001 Reserved for inherently not codable concepts without codable children: Secondary | ICD-10-CM

## 2017-02-25 DIAGNOSIS — E669 Obesity, unspecified: Secondary | ICD-10-CM

## 2017-02-25 DIAGNOSIS — E559 Vitamin D deficiency, unspecified: Secondary | ICD-10-CM

## 2017-02-25 DIAGNOSIS — L97809 Non-pressure chronic ulcer of other part of unspecified lower leg with unspecified severity: Secondary | ICD-10-CM | POA: Diagnosis not present

## 2017-02-25 DIAGNOSIS — E1351 Other specified diabetes mellitus with diabetic peripheral angiopathy without gangrene: Secondary | ICD-10-CM | POA: Diagnosis not present

## 2017-02-25 DIAGNOSIS — E7800 Pure hypercholesterolemia, unspecified: Secondary | ICD-10-CM | POA: Diagnosis not present

## 2017-02-25 DIAGNOSIS — I10 Essential (primary) hypertension: Secondary | ICD-10-CM | POA: Diagnosis not present

## 2017-02-25 LAB — BASIC METABOLIC PANEL
BUN / CREAT RATIO: 28 — AB (ref 9–20)
BUN: 25 mg/dL — ABNORMAL HIGH (ref 6–24)
CHLORIDE: 102 mmol/L (ref 96–106)
CO2: 24 mmol/L (ref 18–29)
CREATININE: 0.88 mg/dL (ref 0.76–1.27)
Calcium: 9.2 mg/dL (ref 8.7–10.2)
GFR calc Af Amer: 112 mL/min/{1.73_m2} (ref >59)
GFR, EST NON AFRICAN AMERICAN: 97 mL/min/{1.73_m2} (ref >59)
Glucose: 119 mg/dL — ABNORMAL HIGH (ref 65–99)
Potassium: 3.7 mmol/L (ref 3.5–5.2)
SODIUM: 144 mmol/L (ref 134–144)

## 2017-02-25 LAB — CBC WITH DIFFERENTIAL/PLATELET
BASOS ABS: 0 10*3/uL (ref 0.0–0.2)
Basos: 1 %
EOS (ABSOLUTE): 0.1 10*3/uL (ref 0.0–0.4)
Eos: 1 %
HEMOGLOBIN: 14 g/dL (ref 13.0–17.7)
Hematocrit: 43 % (ref 37.5–51.0)
IMMATURE GRANS (ABS): 0 10*3/uL (ref 0.0–0.1)
Immature Granulocytes: 0 %
LYMPHS: 26 %
Lymphocytes Absolute: 1.8 10*3/uL (ref 0.7–3.1)
MCH: 28.2 pg (ref 26.6–33.0)
MCHC: 32.6 g/dL (ref 31.5–35.7)
MCV: 87 fL (ref 79–97)
MONOCYTES: 10 %
Monocytes Absolute: 0.7 10*3/uL (ref 0.1–0.9)
NEUTROS ABS: 4.3 10*3/uL (ref 1.4–7.0)
Neutrophils: 62 %
Platelets: 231 10*3/uL (ref 150–379)
RBC: 4.97 x10E6/uL (ref 4.14–5.80)
RDW: 14.4 % (ref 12.3–15.4)
WBC: 6.9 10*3/uL (ref 3.4–10.8)

## 2017-02-25 LAB — FERRITIN: FERRITIN: 37 ng/mL (ref 30–400)

## 2017-02-25 LAB — HEPATIC FUNCTION PANEL
ALBUMIN: 3.8 g/dL (ref 3.5–5.5)
ALT: 28 IU/L (ref 0–44)
AST: 23 IU/L (ref 0–40)
Alkaline Phosphatase: 72 IU/L (ref 39–117)
BILIRUBIN TOTAL: 0.3 mg/dL (ref 0.0–1.2)
Bilirubin, Direct: 0.08 mg/dL (ref 0.00–0.40)
TOTAL PROTEIN: 6.9 g/dL (ref 6.0–8.5)

## 2017-02-25 LAB — HEMOGLOBIN A1C
ESTIMATED AVERAGE GLUCOSE: 126 mg/dL
HEMOGLOBIN A1C: 6 % — AB (ref 4.8–5.6)

## 2017-02-25 LAB — LIPID PANEL
CHOL/HDL RATIO: 4.5 ratio (ref 0.0–5.0)
Cholesterol, Total: 187 mg/dL (ref 100–199)
HDL: 42 mg/dL (ref >39)
LDL CALC: 127 mg/dL — AB (ref 0–99)
TRIGLYCERIDES: 88 mg/dL (ref 0–149)
VLDL CHOLESTEROL CAL: 18 mg/dL (ref 5–40)

## 2017-02-25 LAB — TSH: TSH: 1.63 u[IU]/mL (ref 0.450–4.500)

## 2017-02-25 LAB — VITAMIN D 25 HYDROXY (VIT D DEFICIENCY, FRACTURES): Vit D, 25-Hydroxy: 14 ng/mL — ABNORMAL LOW (ref 30.0–100.0)

## 2017-02-25 NOTE — Progress Notes (Signed)
Pre-visit discussion using our clinic review tool. No additional management support is needed unless otherwise documented below in the visit note.  

## 2017-02-25 NOTE — Patient Instructions (Signed)
Vitamin D3 2000 units per day 

## 2017-02-25 NOTE — Progress Notes (Signed)
[name], male   DOB: 10/23/1961, 56 y.o.   MRN: [id number]   Subjective:    [name], male    DOB: 1961/02/22, 56 y.o.   MRN: [id number]  HPI  Patient here for a scheduled follow up.  He reports he is doing better.  Was seen at [facility].  Lower extremity swelling is better.  Wearing compression hose.  Has gained some weight.  Has changed jobs.  More sedentary.  Just recently has started adjusting his diet.  Is starting back exercising.  No chest pain.  Breathing stable.  No acid reflux.  No abdominal pain.  Bowels moving.  Discussed recent lab results.  Vitamin D level low.    Past Medical History:  Diagnosis Date  . 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 Depression   . Environmental allergies   . Family history of colon cancer   . GERD (gastroesophageal reflux disease)   . History of chicken pox   . Hypertension   . Venous stasis   . Vitamin D deficiency    Past Surgical History:  Procedure Laterality Date  . CHOLECYSTECTOMY  2002  . COLONOSCOPY WITH PROPOFOL N/A 06/27/2015   Procedure: COLONOSCOPY WITH PROPOFOL;  Surgeon: Lollie Sails, MD;  Location: [facility] ENDOSCOPY;  Service: Endoscopy;  Laterality: N/A;  . HERNIA REPAIR  1962   Family History  Problem Relation Age of Onset  . Stomach cancer Mother   . Arthritis Mother   . Diabetes Mother   . Hypertension Mother   . Hyperlipidemia Father   . Heart disease Father   . Diabetes Father   . Hypertension Father   . Arthritis Maternal Grandmother   . Hyperlipidemia Maternal Grandmother   . Hypertension Maternal Grandmother   . Arthritis Maternal Grandfather   . Hyperlipidemia Maternal Grandfather   . Heart disease Maternal Grandfather   . Hypertension Maternal Grandfather   . Arthritis Paternal Grandmother   . Breast cancer Paternal Grandmother   . Hypertension Paternal Grandmother   . Arthritis  Paternal Grandfather   . Hyperlipidemia Paternal Grandfather   . Heart disease Paternal Grandfather   . Diabetes Paternal Grandfather   . Hypertension Paternal Grandfather   . Diabetes Sister    Social History   Social History  . Marital status: Widowed    Spouse name: N/A  . Number of children: N/A  . Years of education: N/A   Social History Main Topics  . Smoking status: Never Smoker  . Smokeless tobacco: Never Used  . Alcohol use 0.0 oz/week     Comment: 1 drink every 2 months  . Drug use: No  . Sexual activity: Not Asked   Other Topics Concern  . None   Social History Narrative  . None    Outpatient Encounter Prescriptions as of 02/25/2017  Medication Sig  . lisinopril (PRINIVIL,ZESTRIL) 20 MG tablet TAKE ONE TABLET BY MOUTH ONCE DAILY  . loratadine (CLARITIN) 10 MG tablet Take 10 mg by mouth daily as needed for allergies.  . ranitidine (ZANTAC) 150 MG tablet Take 150 mg by mouth at bedtime as needed for heartburn.  . triamcinolone cream (KENALOG) 0.1 % Apply 1 application topically 2 (two) times daily.   No facility-administered encounter medications on file as of 02/25/2017.     Review of Systems  Constitutional: Negative for appetite change and unexpected weight change.  HENT: Negative  for congestion and sinus pressure.   Respiratory: Negative for cough, chest tightness and shortness of breath.   Cardiovascular: Negative for chest pain and palpitations.       Leg swelling improved.    Gastrointestinal: Negative for abdominal pain, diarrhea, nausea and vomiting.  Genitourinary: Negative for difficulty urinating and dysuria.  Musculoskeletal: Negative for back pain and myalgias.  Skin: Negative for color change and rash.  Neurological: Negative for dizziness, light-headedness and headaches.  Psychiatric/Behavioral: Negative for agitation and dysphoric mood.       Objective:     Blood pressure rechecked by me:  140-142/78  Physical Exam  Constitutional: He  appears well-developed and well-nourished. No distress.  HENT:  Nose: Nose normal.  Mouth/Throat: Oropharynx is clear and moist.  Neck: Neck supple. No thyromegaly present.  Cardiovascular: Normal rate and regular rhythm.   Pulmonary/Chest: Effort normal and breath sounds normal. No respiratory distress.  Abdominal: Soft. Bowel sounds are normal. There is no tenderness.  Musculoskeletal: He exhibits no tenderness.  Lower extremity swelling much improved.  Stasis changes present.   Lymphadenopathy:    He has no cervical adenopathy.  Skin: No rash noted. No erythema.  Psychiatric: He has a normal mood and affect. His behavior is normal.    BP (!) 150/82 (BP Location: Left Arm, Patient Position: Sitting, Cuff Size: Large)   Pulse 67   Temp 98.7 F (37.1 C) (Oral)   Resp 14   Ht _0  (1.854 m)   Wt (!) 303 lb 9.6 oz (137.7 kg)   SpO2 96%   BMI 40.06 kg/m  Wt Readings from Last 3 Encounters:  02/25/17 (!) 303 lb 9.6 oz (137.7 kg)  11/29/16 290 lb 6.4 oz (131.7 kg)  10/25/16 279 lb 12.8 oz (126.9 kg)     Lab Results  Component Value Date   WBC 6.9 02/24/2017   HGB 12.3 (A) 07/02/2016   HCT 43.0 02/24/2017   PLT 231 02/24/2017   GLUCOSE 119 (H) 02/24/2017   CHOL 187 02/24/2017   TRIG 88 02/24/2017   HDL 42 02/24/2017   LDLCALC 127 (H) 02/24/2017   ALT 28 02/24/2017   AST 23 02/24/2017   NA 144 02/24/2017   K 3.7 02/24/2017   CL 102 02/24/2017   CREATININE 0.88 02/24/2017   BUN 25 (H) 02/24/2017   CO2 24 02/24/2017   TSH 1.630 02/24/2017   HGBA1C 6.0 (H) 02/24/2017        Assessment & Plan:   Problem List Items Addressed This Visit    Anemia    hgb just checked 14.  Ferritin wnl.  Follow.        Essential hypertension    Blood pressure as outlined.  Rechecked improved.  Have him spot check his pressure.  Get him back in soon to reassess.        Hypercholesterolemia    Low cholesterol diet and exercise.  LDL 127.  Follow lipid panel.        Obesity     Discussed diet and exercise.  Follow.        Secondary diabetes with peripheral circulatory disorder (HCC)    Low carb diet and exercise.  a1c just checked 6.0.  Discussed diet and exercise.  Follow met b and a1c.       Venous stasis ulcer ([place])    Has been followed by vascular surgery.  No ulcer now.  Swelling improved.  Continue compression hose.        Vitamin  D deficiency    Start vitamin D3 2000 units per day.  Follow.           [name], MD

## 2017-02-26 ENCOUNTER — Encounter: Payer: Self-pay | Admitting: Internal Medicine

## 2017-02-26 NOTE — Assessment & Plan Note (Signed)
Has been followed by vascular surgery.  No ulcer now.  Swelling improved.  Continue compression hose.

## 2017-02-26 NOTE — Assessment & Plan Note (Signed)
Low carb diet and exercise.  a1c just checked 6.0.  Discussed diet and exercise.  Follow met b and a1c.

## 2017-02-26 NOTE — Assessment & Plan Note (Signed)
Blood pressure as outlined.  Rechecked improved.  Have him spot check his pressure.  Get him back in soon to reassess.

## 2017-02-26 NOTE — Assessment & Plan Note (Signed)
Start vitamin D3 2000 units per day.  Follow.

## 2017-02-26 NOTE — Assessment & Plan Note (Signed)
hgb just checked 14.  Ferritin wnl.  Follow.

## 2017-02-26 NOTE — Assessment & Plan Note (Signed)
Low cholesterol diet and exercise.  LDL 127.  Follow lipid panel.

## 2017-02-26 NOTE — Assessment & Plan Note (Signed)
Discussed diet and exercise.  Follow.  

## 2017-07-01 LAB — HEMOGLOBIN A1C: Hemoglobin A1C: 5.9

## 2017-07-01 LAB — LIPID PANEL
CHOLESTEROL: 174 (ref 0–200)
HDL: 44 (ref 35–70)
LDL Cholesterol: 118
Triglycerides: 59 (ref 40–160)

## 2017-07-01 LAB — BASIC METABOLIC PANEL
CREATININE: 0.9 (ref 0.6–1.3)
GLUCOSE: 115

## 2017-07-14 ENCOUNTER — Ambulatory Visit (INDEPENDENT_AMBULATORY_CARE_PROVIDER_SITE_OTHER): Payer: 59 | Admitting: Internal Medicine

## 2017-07-14 ENCOUNTER — Encounter: Payer: Self-pay | Admitting: Internal Medicine

## 2017-07-14 VITALS — BP 148/88 | HR 61 | Temp 98.6°F | Resp 14 | Ht 73.0 in | Wt 299.2 lb

## 2017-07-14 DIAGNOSIS — E7800 Pure hypercholesterolemia, unspecified: Secondary | ICD-10-CM | POA: Diagnosis not present

## 2017-07-14 DIAGNOSIS — Z6839 Body mass index (BMI) 39.0-39.9, adult: Secondary | ICD-10-CM | POA: Diagnosis not present

## 2017-07-14 DIAGNOSIS — R0602 Shortness of breath: Secondary | ICD-10-CM

## 2017-07-14 DIAGNOSIS — E1351 Other specified diabetes mellitus with diabetic peripheral angiopathy without gangrene: Secondary | ICD-10-CM | POA: Diagnosis not present

## 2017-07-14 DIAGNOSIS — I10 Essential (primary) hypertension: Secondary | ICD-10-CM

## 2017-07-14 DIAGNOSIS — L97809 Non-pressure chronic ulcer of other part of unspecified lower leg with unspecified severity: Secondary | ICD-10-CM | POA: Diagnosis not present

## 2017-07-14 DIAGNOSIS — Z23 Encounter for immunization: Secondary | ICD-10-CM

## 2017-07-14 DIAGNOSIS — I83008 Varicose veins of unspecified lower extremity with ulcer other part of lower leg: Secondary | ICD-10-CM | POA: Diagnosis not present

## 2017-07-14 MED ORDER — LISINOPRIL 40 MG PO TABS: 40.0000 mg | ORAL_TABLET | Freq: Every day | ORAL | 3 refills | Status: DC

## 2017-07-14 MED ORDER — ROSUVASTATIN CALCIUM 10 MG PO TABS: 10.0000 mg | ORAL_TABLET | Freq: Every day | ORAL | 1 refills | Status: DC

## 2017-07-14 NOTE — Progress Notes (Signed)
[name], male   DOB: Mar 31, 1961, 56 y.o.   MRN: [id number]   Subjective:    [name], male    DOB: 03/14/1961, 56 y.o.   MRN: [id number]  HPI  Patient here for a scheduled follow up.  He reports he is doing relatively well.  Has noticed some tightness across his shoulders.  Mostly notices this when he has been driving for a long time or when it is cold.  On further questioning, may notice a little chest discomfort at times.  No significant pain.  Some sob with exertion.  Has not been exercising regularly.  No acid reflux.  No abdominal pain.  Bowels moving.  Staying hydrated.  Leg swelling is better.  Discussed recent labs.  Discussed calculated risk.  Discussed starting cholesterol medication.  He is agreeable.  Discussed diet and exercise and weight loss.     Past Medical History:  Diagnosis Date  . 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 Depression   . Environmental allergies   . Family history of colon cancer   . GERD (gastroesophageal reflux disease)   . History of chicken pox   . Hypertension   . Venous stasis   . Vitamin D deficiency    Past Surgical History:  Procedure Laterality Date  . CHOLECYSTECTOMY  2002  . COLONOSCOPY WITH PROPOFOL N/A 06/27/2015   Procedure: COLONOSCOPY WITH PROPOFOL;  Surgeon: Lollie Sails, MD;  Location: [facility] ENDOSCOPY;  Service: Endoscopy;  Laterality: N/A;  . HERNIA REPAIR  1962   Family History  Problem Relation Age of Onset  . Stomach cancer Mother   . Arthritis Mother   . Diabetes Mother   . Hypertension Mother   . Hyperlipidemia Father   . Heart disease Father   . Diabetes Father   . Hypertension Father   . Arthritis Maternal Grandmother   . Hyperlipidemia Maternal Grandmother   . Hypertension Maternal Grandmother   . Arthritis Maternal Grandfather   . Hyperlipidemia Maternal Grandfather   . Heart disease  Maternal Grandfather   . Hypertension Maternal Grandfather   . Arthritis Paternal Grandmother   . Breast cancer Paternal Grandmother   . Hypertension Paternal Grandmother   . Arthritis Paternal Grandfather   . Hyperlipidemia Paternal Grandfather   . Heart disease Paternal Grandfather   . Diabetes Paternal Grandfather   . Hypertension Paternal Grandfather   . Diabetes Sister    Social History   Social History  . Marital status: Widowed    Spouse name: N/A  . Number of children: N/A  . Years of education: N/A   Social History Main Topics  . Smoking status: Never Smoker  . Smokeless tobacco: Never Used  . Alcohol use 0.0 oz/week     Comment: 1 drink every 2 months  . Drug use: No  . Sexual activity: Not Asked   Other Topics Concern  . None   Social History Narrative  . None    Outpatient Encounter Prescriptions as of 07/14/2017  Medication Sig  . loratadine (CLARITIN) 10 MG tablet Take 10 mg by mouth daily as needed for allergies.  . ranitidine (ZANTAC) 150 MG tablet Take 150 mg by mouth at bedtime as needed for heartburn.  . [DISCONTINUED] lisinopril (PRINIVIL,ZESTRIL) 20 MG tablet TAKE ONE TABLET BY MOUTH ONCE DAILY  . lisinopril (PRINIVIL,ZESTRIL) 40 MG tablet Take 1 tablet (40 mg total) by  mouth daily.  . rosuvastatin (CRESTOR) 10 MG tablet Take 1 tablet (10 mg total) by mouth daily.  . [DISCONTINUED] triamcinolone cream (KENALOG) 0.1 % Apply 1 application topically 2 (two) times daily.   No facility-administered encounter medications on file as of 07/14/2017.     Review of Systems  Constitutional: Negative for appetite change and unexpected weight change.  HENT: Negative for congestion and sinus pressure.   Respiratory: Negative for cough, chest tightness and shortness of breath.   Cardiovascular: Negative for chest pain and palpitations.       Lower extremity edema - improved.    Gastrointestinal: Negative for abdominal pain, diarrhea, nausea and vomiting.    Genitourinary: Negative for difficulty urinating and dysuria.  Musculoskeletal: Negative for joint swelling and myalgias.  Skin: Negative for color change and rash.  Neurological: Negative for dizziness, light-headedness and headaches.  Psychiatric/Behavioral: Negative for agitation and dysphoric mood.       Objective:     Blood pressure rechecked by me:  148/88  Physical Exam  Constitutional: He appears well-developed and well-nourished. No distress.  HENT:  Nose: Nose normal.  Mouth/Throat: Oropharynx is clear and moist.  Neck: Neck supple. No thyromegaly present.  Cardiovascular: Normal rate and regular rhythm.   Pulmonary/Chest: Effort normal and breath sounds normal. No respiratory distress.  Abdominal: Soft. Bowel sounds are normal. There is no tenderness.  Musculoskeletal: He exhibits no edema or tenderness.  Edema - improved.    Lymphadenopathy:    He has no cervical adenopathy.  Skin: No rash noted. No erythema.  Stasis changes - lower extremity.    Psychiatric: He has a normal mood and affect. His behavior is normal.    BP (!) 148/88   Pulse 61   Temp 98.6 F (37 C) (Oral)   Resp 14   Ht 6' 1"  (1.854 m)   Wt 299 lb 3.2 oz (135.7 kg)   SpO2 95%   BMI 39.47 kg/m  Wt Readings from Last 3 Encounters:  07/14/17 299 lb 3.2 oz (135.7 kg)  02/25/17 (!) 303 lb 9.6 oz (137.7 kg)  11/29/16 290 lb 6.4 oz (131.7 kg)     Lab Results  Component Value Date   WBC 6.9 02/24/2017   HGB 14.0 02/24/2017   HCT 43.0 02/24/2017   PLT 231 02/24/2017   GLUCOSE 119 (H) 02/24/2017   CHOL 174 07/01/2017   TRIG 59 07/01/2017   HDL 44 07/01/2017   LDLCALC 118 07/01/2017   ALT 28 02/24/2017   AST 23 02/24/2017   NA 144 02/24/2017   K 3.7 02/24/2017   CL 102 02/24/2017   CREATININE 0.9 07/01/2017   BUN 25 (H) 02/24/2017   CO2 24 02/24/2017   TSH 1.630 02/24/2017   HGBA1C 5.9 07/01/2017    Dg Lumbar Spine 2-3 Views  Result Date: 09/17/2015 CLINICAL DATA:  Lower back  pain. EXAM: LUMBAR SPINE - 2-3 VIEW COMPARISON:  None. FINDINGS: Multilevel spondylosis noted throughout the visualized spine. Moderate proliferative changes are present at T12-L1 and L1-L2. There is suggestion of a minimal retrolisthesis of L2 on L3 of approximately 4-5 mm. Mild proliferative changes are present at L3-4. Moderate facet hypertrophy present at L4-5 and L5-S1. No acute fracture identified. No bony lesions are seen. 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 There is mild retrolisthesis of L2 on L3 by approximately 4-5 mm. Electronically Signed   By: Aletta Edouard M.D.   On: 09/17/2015 09:02  Assessment & Plan:   Problem List Items Addressed This Visit    BMI 39.0-39.9,adult    Discussed diet and exercise.  Discussed weight loss.  Form for work completed.  Follow.        Essential hypertension    Blood pressure elevated today.  Have him spot check his pressure.  Increase lisinopril to 73m q day.  Follow pressures.  Follow metabolic panel.        Relevant Medications   rosuvastatin (CRESTOR) 10 MG tablet   lisinopril (PRINIVIL,ZESTRIL) 40 MG tablet   Hypercholesterolemia    Discussed low cholesterol diet and exercise.  Discussed calculated risk.  Start crestor.  Follow lipid panel and liver function tests.        Relevant Medications   rosuvastatin (CRESTOR) 10 MG tablet   lisinopril (PRINIVIL,ZESTRIL) 40 MG tablet   Other Relevant Orders   Hepatic function panel   Secondary diabetes with peripheral circulatory disorder ([place]    Discussed low carb diet and exercise.  a1c just checked 5.9.  Discussed weight loss.  Follow met b and a1c.        Relevant Medications   rosuvastatin (CRESTOR) 10 MG tablet   lisinopril (PRINIVIL,ZESTRIL) 40 MG tablet   SOB (shortness of breath) on exertion - Primary    Describes the sob with exertion.  Some tightness in her shoulders.  The shoulder symptoms may be more related to a msk origin.  Given  the sob with exertion and risk factors, EKG - SR with no acute ischemic changes.  Discussed further evaluation.  Will refer to cardiology for evaluation with question of need for further cardiac w/up.       Relevant Orders   EKG 12-Lead (Completed)   Ambulatory referral to Cardiology   Venous stasis ulcer ([place]    Has been followed by vascular surgery.  No ulcer now.  Swelling improved.  Doing well.  Continue compression hose.         Other Visit Diagnoses    Encounter for immunization       Relevant Medications   rosuvastatin (CRESTOR) 10 MG tablet   lisinopril (PRINIVIL,ZESTRIL) 40 MG tablet   Other Relevant Orders   Basic metabolic panel (Completed)   Lipid panel (Completed)   Hemoglobin A1c (Completed)   EKG 12-Lead (Completed)   Ambulatory referral to Cardiology     I spent 40 minutes with the patient and more than 50% of the time was spent in consultation regarding the above.  Time spent obtaining history and discussing current symptoms and weight loss, etc.  Time also spent discussing treatment options and need for further evaluation.     SEinar Pheasant MD

## 2017-07-14 NOTE — Patient Instructions (Signed)
Increase lisinopril to 40mg  per day  Start crestor 10mg  per day.  Go by and get your lab drawn at [facility] in 6 weeks.  (non fasting lab).

## 2017-07-14 NOTE — Progress Notes (Signed)
Pre-visit discussion using our clinic review tool. No additional management support is needed unless otherwise documented below in the visit note.  

## 2017-07-15 ENCOUNTER — Telehealth: Payer: Self-pay | Admitting: Internal Medicine

## 2017-07-15 ENCOUNTER — Other Ambulatory Visit: Payer: Self-pay

## 2017-07-15 ENCOUNTER — Encounter: Payer: Self-pay | Admitting: Internal Medicine

## 2017-07-15 DIAGNOSIS — R0602 Shortness of breath: Secondary | ICD-10-CM | POA: Insufficient documentation

## 2017-07-15 MED ORDER — ROSUVASTATIN CALCIUM 10 MG PO TABS: 10.0000 mg | ORAL_TABLET | Freq: Every day | ORAL | 0 refills | Status: DC

## 2017-07-15 NOTE — Telephone Encounter (Signed)
Pt called and stated that he would like his rosuvastatin (CRESTOR) 10 MG tablet filled at [place], [place] instead. Please advise, thank you!

## 2017-07-15 NOTE — Assessment & Plan Note (Signed)
Discussed low carb diet and exercise.  a1c just checked 5.9.  Discussed weight loss.  Follow met b and a1c.

## 2017-07-15 NOTE — Telephone Encounter (Signed)
Sent to mail order

## 2017-07-15 NOTE — Assessment & Plan Note (Signed)
Has been followed by vascular surgery.  No ulcer now.  Swelling improved.  Doing well.  Continue compression hose.

## 2017-07-15 NOTE — Assessment & Plan Note (Signed)
Discussed low cholesterol diet and exercise.  Discussed calculated risk.  Start crestor.  Follow lipid panel and liver function tests.

## 2017-07-15 NOTE — Assessment & Plan Note (Signed)
Describes the sob with exertion.  Some tightness in her shoulders.  The shoulder symptoms may be more related to a msk origin.  Given the sob with exertion and risk factors, EKG - SR with no acute ischemic changes.  Discussed further evaluation.  Will refer to cardiology for evaluation with question of need for further cardiac w/up.

## 2017-07-15 NOTE — Assessment & Plan Note (Signed)
Discussed diet and exercise.  Discussed weight loss.  Form for work completed.  Follow.

## 2017-07-15 NOTE — Assessment & Plan Note (Signed)
Blood pressure elevated today.  Have him spot check his pressure.  Increase lisinopril to 40mg  q day.  Follow pressures.  Follow metabolic panel.

## 2017-09-02 ENCOUNTER — Other Ambulatory Visit: Payer: Self-pay | Admitting: Internal Medicine

## 2017-09-13 ENCOUNTER — Telehealth: Payer: Self-pay | Admitting: Internal Medicine

## 2017-09-13 NOTE — Telephone Encounter (Signed)
Pt called and wanted to know if he could get his cholesterol checked before his appt on 10/26. Please advise, thank you!  Call pt @ [phone]

## 2017-09-13 NOTE — Telephone Encounter (Signed)
If two months ago, too early to check.

## 2017-09-13 NOTE — Telephone Encounter (Signed)
Too early to check, thanks

## 2017-09-13 NOTE — Telephone Encounter (Signed)
His last cholesterol labs were approximately 2 months ago, please advise, thanks

## 2017-09-16 ENCOUNTER — Encounter: Payer: Self-pay | Admitting: Internal Medicine

## 2017-09-16 ENCOUNTER — Ambulatory Visit (INDEPENDENT_AMBULATORY_CARE_PROVIDER_SITE_OTHER): Payer: 59 | Admitting: Internal Medicine

## 2017-09-16 VITALS — BP 140/78 | HR 70 | Temp 98.6°F | Resp 14 | Ht 73.0 in | Wt 306.8 lb

## 2017-09-16 DIAGNOSIS — E7800 Pure hypercholesterolemia, unspecified: Secondary | ICD-10-CM

## 2017-09-16 DIAGNOSIS — Z6841 Body Mass Index (BMI) 40.0 and over, adult: Secondary | ICD-10-CM

## 2017-09-16 DIAGNOSIS — E1351 Other specified diabetes mellitus with diabetic peripheral angiopathy without gangrene: Secondary | ICD-10-CM | POA: Diagnosis not present

## 2017-09-16 DIAGNOSIS — K219 Gastro-esophageal reflux disease without esophagitis: Secondary | ICD-10-CM

## 2017-09-16 DIAGNOSIS — R0602 Shortness of breath: Secondary | ICD-10-CM

## 2017-09-16 DIAGNOSIS — Z23 Encounter for immunization: Secondary | ICD-10-CM | POA: Diagnosis not present

## 2017-09-16 DIAGNOSIS — I10 Essential (primary) hypertension: Secondary | ICD-10-CM | POA: Diagnosis not present

## 2017-09-16 DIAGNOSIS — R739 Hyperglycemia, unspecified: Secondary | ICD-10-CM

## 2017-09-16 DIAGNOSIS — D649 Anemia, unspecified: Secondary | ICD-10-CM

## 2017-09-16 DIAGNOSIS — E559 Vitamin D deficiency, unspecified: Secondary | ICD-10-CM

## 2017-09-16 NOTE — Progress Notes (Signed)
[name], male   DOB: 31-Mar-1961, 56 y.o.   MRN: [id number]   Subjective:    [name], male    DOB: Apr 17, 1961, 56 y.o.   MRN: [id number]  HPI  Patient here for a scheduled follow up.  States he is doing well.  Has been traveling recently.  Has not been watching his diet.  Plans to start watching better.  Also discussed exercise.  No chest pain.  Breathing stable.  Due to see cardiology soon.  See last note.  He was having problems with sob.  No acid reflux.  No abdominal pain.  Bowels moving.  Swelling stable.  No ulcerations on his legs.  Overall he feels he is doing well.  Taking his blood pressure medication. Not checking his blood pressure regularly.     Past Medical History:  Diagnosis Date  . 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 Depression   . Environmental allergies   . Family history of colon cancer   . GERD (gastroesophageal reflux disease)   . History of chicken pox   . Hypertension   . Venous stasis   . Vitamin D deficiency    Past Surgical History:  Procedure Laterality Date  . CHOLECYSTECTOMY  2002  . COLONOSCOPY WITH PROPOFOL N/A 06/27/2015   Procedure: COLONOSCOPY WITH PROPOFOL;  Surgeon: Lollie Sails, MD;  Location: [facility] ENDOSCOPY;  Service: Endoscopy;  Laterality: N/A;  . HERNIA REPAIR  1962   Family History  Problem Relation Age of Onset  . Stomach cancer Mother   . Arthritis Mother   . Diabetes Mother   . Hypertension Mother   . Hyperlipidemia Father   . Heart disease Father   . Diabetes Father   . Hypertension Father   . Arthritis Maternal Grandmother   . Hyperlipidemia Maternal Grandmother   . Hypertension Maternal Grandmother   . Arthritis Maternal Grandfather   . Hyperlipidemia Maternal Grandfather   . Heart disease Maternal Grandfather   . Hypertension Maternal Grandfather   . Arthritis Paternal Grandmother   . Breast  cancer Paternal Grandmother   . Hypertension Paternal Grandmother   . Arthritis Paternal Grandfather   . Hyperlipidemia Paternal Grandfather   . Heart disease Paternal Grandfather   . Diabetes Paternal Grandfather   . Hypertension Paternal Grandfather   . Diabetes Sister    Social History   Social History  . Marital status: Widowed    Spouse name: N/A  . Number of children: N/A  . Years of education: N/A   Social History Main Topics  . Smoking status: Never Smoker  . Smokeless tobacco: Never Used  . Alcohol use 0.0 oz/week     Comment: 1 drink every 2 months  . Drug use: No  . Sexual activity: Not Asked   Other Topics Concern  . None   Social History Narrative  . None    Outpatient Encounter Prescriptions as of 09/16/2017  Medication Sig  . lisinopril (PRINIVIL,ZESTRIL) 40 MG tablet Take 1 tablet (40 mg total) by mouth daily.  Marland Kitchen loratadine (CLARITIN) 10 MG tablet Take 10 mg by mouth daily as needed for allergies.  . ranitidine (ZANTAC) 150 MG tablet Take 150 mg by mouth at bedtime as needed for heartburn.  . rosuvastatin (CRESTOR) 10 MG tablet TAKE 1 TABLET BY MOUTH  DAILY   No facility-administered encounter medications on file as of 09/16/2017.  Review of Systems  Constitutional: Negative for appetite change.       Has gained some weight.    HENT: Negative for congestion and sinus pressure.   Respiratory: Negative for cough, chest tightness and shortness of breath.   Cardiovascular: Negative for chest pain and palpitations.  Gastrointestinal: Negative for abdominal pain, diarrhea, nausea and vomiting.  Genitourinary: Negative for difficulty urinating and dysuria.  Musculoskeletal: Negative for joint swelling and myalgias.  Skin: Negative for rash.       Stasis changes - lower extremities.    Neurological: Negative for dizziness, light-headedness and headaches.  Psychiatric/Behavioral: Negative for agitation and dysphoric mood.       Objective:      Physical Exam  Constitutional: He appears well-developed and well-nourished. No distress.  HENT:  Nose: Nose normal.  Mouth/Throat: Oropharynx is clear and moist.  Neck: Neck supple. No thyromegaly present.  Cardiovascular: Normal rate and regular rhythm.   Pulmonary/Chest: Effort normal and breath sounds normal. No respiratory distress.  Abdominal: Soft. Bowel sounds are normal. There is no tenderness.  Musculoskeletal:  No increased edema.  Stasis changes noted.  No ulcerations.    Lymphadenopathy:    He has no cervical adenopathy.  Psychiatric: He has a normal mood and affect. His behavior is normal.    BP 140/78   Pulse 70   Temp 98.6 F (37 C) (Oral)   Resp 14   Ht 6' 1"  (1.854 m)   Wt (!) 306 lb 12.8 oz (139.2 kg)   SpO2 97%   BMI 40.48 kg/m  Wt Readings from Last 3 Encounters:  09/16/17 (!) 306 lb 12.8 oz (139.2 kg)  07/14/17 299 lb 3.2 oz (135.7 kg)  02/25/17 (!) 303 lb 9.6 oz (137.7 kg)     Lab Results  Component Value Date   WBC 6.9 02/24/2017   HGB 14.0 02/24/2017   HCT 43.0 02/24/2017   PLT 231 02/24/2017   GLUCOSE 119 (H) 02/24/2017   CHOL 174 07/01/2017   TRIG 59 07/01/2017   HDL 44 07/01/2017   LDLCALC 118 07/01/2017   ALT 28 02/24/2017   AST 23 02/24/2017   NA 144 02/24/2017   K 3.7 02/24/2017   CL 102 02/24/2017   CREATININE 0.9 07/01/2017   BUN 25 (H) 02/24/2017   CO2 24 02/24/2017   TSH 1.630 02/24/2017   HGBA1C 5.9 07/01/2017    Dg Lumbar Spine 2-3 Views  Result Date: 09/17/2015 CLINICAL DATA:  Lower back pain. EXAM: LUMBAR SPINE - 2-3 VIEW COMPARISON:  None. FINDINGS: Multilevel spondylosis noted throughout the visualized spine. Moderate proliferative changes are present at T12-L1 and L1-L2. There is suggestion of a minimal retrolisthesis of L2 on L3 of approximately 4-5 mm. Mild proliferative changes are present at L3-4. Moderate facet hypertrophy present at L4-5 and L5-S1. No acute fracture identified. No bony lesions are seen.  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 There is mild retrolisthesis of L2 on L3 by approximately 4-5 mm. Electronically Signed   By: Aletta Edouard M.D.   On: 09/17/2015 09:02       Assessment & Plan:   Problem List Items Addressed This Visit    Anemia    Follow cbc.        BMI 40.0-44.9, adult (HCC)    Discussed diet and exercise.  Follow.        Essential hypertension    Blood pressure on recheck improved.  Have him spot check his pressure.  Get  him back in soon to reassess.  Follow pressures.  Hold on changing medications.  Follow metabolic panel.        Relevant Orders   Basic metabolic panel   GERD (gastroesophageal reflux disease)    Controlled.  No upper symptoms reported.        Hypercholesterolemia    On crestor.  Low cholesterol diet and exercise.  Follow lipid panel and liver function tests.        Relevant Orders   Lipid panel   Hepatic function panel   Secondary diabetes with peripheral circulatory disorder (HCC)    Discussed low carb diet and exercise.  Follow met b and a1c.  Keep up to date with eye checks.        SOB (shortness of breath) on exertion    See last note for details.  Was referred to cardiology.  Due to see them next week.  Further w/up pending their assessment.  Stable currently.        Vitamin D deficiency    Follow vitamin D level.         Relevant Orders   VITAMIN D 25 Hydroxy (Vit-D Deficiency, Fractures)    Other Visit Diagnoses    Hyperglycemia    -  Primary   Relevant Orders   Hemoglobin A1c       [name], MD

## 2017-09-18 ENCOUNTER — Encounter: Payer: Self-pay | Admitting: Internal Medicine

## 2017-09-18 NOTE — Assessment & Plan Note (Signed)
Controlled.  No upper symptoms reported.   

## 2017-09-18 NOTE — Assessment & Plan Note (Signed)
On crestor.  Low cholesterol diet and exercise.  Follow lipid panel and liver function tests.   

## 2017-09-18 NOTE — Assessment & Plan Note (Signed)
Follow vitamin D level.  

## 2017-09-18 NOTE — Assessment & Plan Note (Signed)
See last note for details.  Was referred to cardiology.  Due to see them next week.  Further w/up pending their assessment.  Stable currently.

## 2017-09-18 NOTE — Assessment & Plan Note (Signed)
Blood pressure on recheck improved.  Have him spot check his pressure.  Get him back in soon to reassess.  Follow pressures.  Hold on changing medications.  Follow metabolic panel.

## 2017-09-18 NOTE — Assessment & Plan Note (Signed)
Follow cbc.  

## 2017-09-18 NOTE — Assessment & Plan Note (Signed)
Discussed low carb diet and exercise.  Follow met b and a1c.  Keep up to date with eye checks.

## 2017-09-18 NOTE — Assessment & Plan Note (Signed)
Discussed diet and exercise.  Follow.  

## 2017-09-19 NOTE — Progress Notes (Signed)
Cardiology Office Note  Date:  09/20/2017   ID:  [name], DOB 01/21/1961, MRN [id number]  PCP:  Bradley Pheasant, MD   Chief Complaint  Patient presents with  . other    New patient. Per [name]. Patient c/o weight going up and down.  Meds reviewed verbally with patient.     HPI:  [name] is a 56 year old gentleman with history of DM II HTN Venous stasis, uses compression hose Hyperlipidemia Morbid obesity Who presents by referral from [name] for consultation of his symptoms of bilateral arm pain  He reports having periodic bilateral shoulder pain, sometimes radiates down his arms into his biceps Symptoms worse with movement of his arms Denies having any symptoms with exertion, actually reports the discomfort feels better when he exerts himself Pain mainly concentrated in his shoulders Discomfort is worse after driving for a long time or when it is cold.  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  PSH:    Past Surgical History:  Procedure Laterality Date  . CHOLECYSTECTOMY  2002  . COLONOSCOPY WITH PROPOFOL N/A 06/27/2015   Procedure: COLONOSCOPY WITH PROPOFOL;  Surgeon: Lollie Sails, MD;   Location: [facility] ENDOSCOPY;  Service: Endoscopy;  Laterality: N/A;  . HERNIA REPAIR  1962    Current Outpatient Prescriptions  Medication Sig Dispense Refill  . lisinopril (PRINIVIL,ZESTRIL) 40 MG tablet Take 1 tablet (40 mg total) by mouth daily. 30 tablet 3  . loratadine (CLARITIN) 10 MG tablet Take 10 mg by mouth daily as needed for allergies.    . ranitidine (ZANTAC) 150 MG tablet Take 150 mg by mouth at bedtime as needed for heartburn.    . rosuvastatin (CRESTOR) 10 MG tablet TAKE 1 TABLET BY MOUTH  DAILY 90 tablet 1   No current facility-administered medications for this visit.      Allergies:   Patient has no known allergies.   Social History:  The patient  reports that he has never smoked. He has never used smokeless tobacco. He reports that he drinks alcohol. He reports that he does not use drugs.   Family History:   family history includes Arthritis in his maternal grandfather, maternal grandmother, mother, paternal grandfather, and paternal grandmother; Breast cancer in his paternal grandmother; Diabetes in his father, mother, paternal grandfather, and sister; Heart disease in his father, maternal grandfather, and paternal grandfather; Hyperlipidemia in his father, maternal grandfather, maternal grandmother, and paternal grandfather; Hypertension in his father, maternal grandfather, maternal grandmother, mother, paternal grandfather, and paternal grandmother; Stomach cancer in his mother.    Review of Systems: Review of Systems  Constitutional: Negative.   Respiratory: Positive for shortness of breath.   Cardiovascular: Negative.   Gastrointestinal: Negative.   Musculoskeletal: Negative.  Bilateral shoulder discomfort  Neurological: Negative.   Psychiatric/Behavioral: Negative.   All other systems reviewed and are negative.    PHYSICAL EXAM: VS:  BP 130/88 (BP Location: Left Wrist, Patient Position: Sitting, Cuff Size: Large)   Pulse 76   Ht 6\' 1"  (1.854 m)   Wt  (!) 305 lb (138.3 kg)   BMI 40.24 kg/m  , BMI Body mass index is 40.24 kg/m. GEN: Well nourished, well developed, in no acute distress, obese HEENT: normal  Neck: no JVD, carotid bruits, or masses Cardiac: RRR; no murmurs, rubs, or gallops,no edema  Respiratory:  clear to auscultation bilaterally, normal work of breathing GI: soft, nontender, nondistended, + BS MS: no deformity or atrophy  Skin: warm and dry, no rash Neuro:  Strength and sensation are intact Psych: euthymic mood, full affect    Recent Labs: 02/24/2017: ALT 28; BUN 25; Hemoglobin 14.0; Platelets 231; Potassium 3.7; Sodium 144; TSH 1.630 07/01/2017: Creatinine 0.9    Lipid Panel Lab Results  Component Value Date   CHOL 174 07/01/2017   HDL 44 07/01/2017   LDLCALC 118 07/01/2017   TRIG 59 07/01/2017  on no crestor    Wt Readings from Last 3 Encounters:  09/20/17 (!) 305 lb (138.3 kg)  09/16/17 (!) 306 lb 12.8 oz (139.2 kg)  07/14/17 299 lb 3.2 oz (135.7 kg)       ASSESSMENT AND PLAN:  Chronic venous insufficiency -  Improved symptoms by wearing compression hose Likely exacerbated by his weight  Essential hypertension - Plan: EKG 12-Lead, CT CARDIAC SCORING Blood pressure is well controlled on today's visit. No changes made to the medications.  Hypercholesterolemia - Plan: EKG 12-Lead, CT CARDIAC SCORING Tolerating Crestor 10 mg daily Recommended aggressive lipid management given his diabetes  SOB (shortness of breath) on exertion - Plan: EKG 12-Lead, CT CARDIAC SCORING Very mild shortness of breath on exertion Likely secondary to obesity and deconditioning Recommended regular exercise program  Chronic pain of both shoulders Atypical pain likely musculoskeletal Otherwise active with no symptoms of chest pain or heavy shortness of breath on exertion But no significant symptoms concerning for angina We did discuss risk stratification techniques given diabetes, hyperlipidemia  Recommended coronary  calcium score.  He prefers to do this next year after he puts money in his flex account through work  Disposition:   F/U as needed  We will call him with the results of his CT coronary calcium scoring  patient seen in consultation for [name] and will be referred back to her office for ongoing care of the issues detailed above   Total encounter time more than 60 minutes  Greater than 50% was spent in counseling and coordination of care with the patient    Orders Placed This Encounter  Procedures  . CT CARDIAC SCORING  . EKG 12-Lead     Signed, Esmond Plants, M.D., Ph.D. 09/20/2017  [place], [place]

## 2017-09-20 ENCOUNTER — Encounter: Payer: Self-pay | Admitting: Cardiovascular Disease

## 2017-09-20 ENCOUNTER — Ambulatory Visit (INDEPENDENT_AMBULATORY_CARE_PROVIDER_SITE_OTHER): Payer: 59 | Admitting: Cardiovascular Disease

## 2017-09-20 VITALS — BP 130/88 | HR 76 | Ht 73.0 in | Wt 305.0 lb

## 2017-09-20 DIAGNOSIS — G8929 Other chronic pain: Secondary | ICD-10-CM | POA: Diagnosis not present

## 2017-09-20 DIAGNOSIS — I10 Essential (primary) hypertension: Secondary | ICD-10-CM

## 2017-09-20 DIAGNOSIS — Z23 Encounter for immunization: Secondary | ICD-10-CM | POA: Diagnosis not present

## 2017-09-20 DIAGNOSIS — M25511 Pain in right shoulder: Secondary | ICD-10-CM | POA: Diagnosis not present

## 2017-09-20 DIAGNOSIS — M25512 Pain in left shoulder: Secondary | ICD-10-CM

## 2017-09-20 DIAGNOSIS — E7800 Pure hypercholesterolemia, unspecified: Secondary | ICD-10-CM

## 2017-09-20 DIAGNOSIS — R0602 Shortness of breath: Secondary | ICD-10-CM | POA: Diagnosis not present

## 2017-09-20 DIAGNOSIS — I872 Venous insufficiency (chronic) (peripheral): Secondary | ICD-10-CM

## 2017-09-20 NOTE — Patient Instructions (Addendum)
Medication Instructions:   No medication changes  Labwork:  No new labs needed  Testing/Procedures:  We will order a CT coronary calcium score Please call [phone] to schedule this at your convenience There is a one-time fee of $150 due at the time of your procedure [address], [place]: It was a pleasure seeing you in the office today. Please call us if you have new issues that need to be addressed before your next appt.  [phone]  Your physician wants you to follow-up in: As needed  If you need a refill on your cardiac medications before your next appointment, please call your pharmacy.     Coronary Calcium Scan A coronary calcium scan is an imaging test used to look for deposits of calcium and other fatty materials (plaques) in the inner lining of the blood vessels of the heart (coronary arteries). These deposits of calcium and plaques can partly clog and narrow the coronary arteries without producing any symptoms or warning signs. This puts a person at risk for a heart attack. This test can detect these deposits before symptoms develop. Tell a health care provider about:  Any allergies you have.  All medicines you are taking, including vitamins, herbs, eye drops, creams, and over-the-counter medicines.  Any problems you or family members have had with anesthetic medicines.  Any blood disorders you have.  Any surgeries you have had.  Any medical conditions you have.  Whether you are pregnant or may be pregnant. What are the risks? Generally, this is a safe procedure. However, problems may occur, including:  Harm to a pregnant woman and her unborn baby. This test involves the use of radiation. Radiation exposure can be dangerous to a pregnant woman and her unborn baby. If you are pregnant, you generally should not have this procedure done.  Slight increase in the risk of cancer. This is because of the radiation involved in  the test.  What happens before the procedure? No preparation is needed for this procedure. What happens during the procedure?  You will undress and remove any jewelry around your neck or chest.  You will put on a hospital gown.  Sticky electrodes will be placed on your chest. The electrodes will be connected to an electrocardiogram (ECG) machine to record a tracing of the electrical activity of your heart.  A CT scanner will take pictures of your heart. During this time, you will be asked to lie still and hold your breath for 2-3 seconds while a picture of your heart is being taken. The procedure may vary among health care providers and hospitals. What happens after the procedure?  You can get dressed.  You can return to your normal activities.  It is up to you to get the results of your test. Ask your health care provider, or the department that is doing the test, when your results will be ready. Summary  A coronary calcium scan is an imaging test used to look for deposits of calcium and other fatty materials (plaques) in the inner lining of the blood vessels of the heart (coronary arteries).  Generally, this is a safe procedure. Tell your health care provider if you are pregnant or may be pregnant.  No preparation is needed for this procedure.  A CT scanner will take pictures of your heart.  You can return to your normal activities after the scan is done. This information is not intended to replace advice given to you by your health  care provider. Make sure you discuss any questions you have with your health care provider. Document Released: 05/06/2008 Document Revised: 09/27/2016 Document Reviewed: 09/27/2016 Elsevier Interactive Patient Education  2017 [employer].

## 2017-10-12 ENCOUNTER — Other Ambulatory Visit: Payer: Self-pay

## 2017-10-12 MED ORDER — LISINOPRIL 40 MG PO TABS: 40.0000 mg | ORAL_TABLET | Freq: Every day | ORAL | 1 refills | Status: DC

## 2017-12-12 ENCOUNTER — Telehealth: Payer: Self-pay

## 2017-12-12 NOTE — Telephone Encounter (Signed)
Copied from [place] [id number]. Topic: Inquiry >> Dec 12, 2017  4:52 PM [name] wrote: Reason for CRM: pt called to see if he is needing any more lab test done for the current active labs that he has open if so contact pt to advise

## 2017-12-13 NOTE — Telephone Encounter (Signed)
Attempted to call patient. Patients phone is not working.

## 2017-12-14 NOTE — Telephone Encounter (Signed)
Patient is wondering if he needs labs done prior to appt on Friday? If so, you can place orders and I can have him come in.

## 2017-12-14 NOTE — Telephone Encounter (Signed)
Lab orders are in for [facility].  He can go by for fasting lab at [facility].

## 2017-12-15 NOTE — Telephone Encounter (Signed)
LMTCB

## 2017-12-16 ENCOUNTER — Ambulatory Visit: Payer: Managed Care, Other (non HMO) | Admitting: Internal Medicine

## 2017-12-16 ENCOUNTER — Encounter: Payer: Self-pay | Admitting: Internal Medicine

## 2017-12-16 DIAGNOSIS — Z6841 Body Mass Index (BMI) 40.0 and over, adult: Secondary | ICD-10-CM | POA: Diagnosis not present

## 2017-12-16 DIAGNOSIS — D649 Anemia, unspecified: Secondary | ICD-10-CM | POA: Diagnosis not present

## 2017-12-16 DIAGNOSIS — K219 Gastro-esophageal reflux disease without esophagitis: Secondary | ICD-10-CM | POA: Diagnosis not present

## 2017-12-16 DIAGNOSIS — E7800 Pure hypercholesterolemia, unspecified: Secondary | ICD-10-CM | POA: Diagnosis not present

## 2017-12-16 DIAGNOSIS — I10 Essential (primary) hypertension: Secondary | ICD-10-CM | POA: Diagnosis not present

## 2017-12-16 DIAGNOSIS — E1351 Other specified diabetes mellitus with diabetic peripheral angiopathy without gangrene: Secondary | ICD-10-CM | POA: Diagnosis not present

## 2017-12-16 DIAGNOSIS — I872 Venous insufficiency (chronic) (peripheral): Secondary | ICD-10-CM | POA: Diagnosis not present

## 2017-12-16 DIAGNOSIS — E559 Vitamin D deficiency, unspecified: Secondary | ICD-10-CM

## 2017-12-16 LAB — HM DIABETES FOOT EXAM

## 2017-12-16 MED ORDER — AMLODIPINE BESYLATE 5 MG PO TABS: 5.0000 mg | ORAL_TABLET | Freq: Every day | ORAL | 2 refills | Status: DC

## 2017-12-16 NOTE — Telephone Encounter (Signed)
Patient missed my call yesterday and is not fasting today so he is going to get his labs drawn at [facility] Monday morning.

## 2017-12-16 NOTE — Telephone Encounter (Signed)
Ok

## 2017-12-16 NOTE — Progress Notes (Signed)
[name], male   DOB: 12-19-1960, 57 y.o.   MRN: [id number]   Subjective:    [name], male    DOB: 10/03/1961, 57 y.o.   MRN: [id number]  HPI  Patient here for a scheduled follow up.  He reports he is doing relatively well.  Has not been watching his diet.  Has gained weight.  Not exercising.  No chest pain.  Breathing stable.  No abdominal pain.  Bowels stable.  Blood pressure elevated.  Legs doing better.  Increased stress with his work.  Overall feels he is handling things well.    Past Medical History:  Diagnosis Date  . 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 Depression   . Environmental allergies   . Family history of colon cancer   . GERD (gastroesophageal reflux disease)   . History of chicken pox   . Hypertension   . Venous stasis   . Vitamin D deficiency    Past Surgical History:  Procedure Laterality Date  . CHOLECYSTECTOMY  2002  . COLONOSCOPY WITH PROPOFOL N/A 06/27/2015   Procedure: COLONOSCOPY WITH PROPOFOL;  Surgeon: Lollie Sails, MD;  Location: [facility] ENDOSCOPY;  Service: Endoscopy;  Laterality: N/A;  . HERNIA REPAIR  1962   Family History  Problem Relation Age of Onset  . Stomach cancer Mother   . Arthritis Mother   . Diabetes Mother   . Hypertension Mother   . Hyperlipidemia Father   . Heart disease Father   . Diabetes Father   . Hypertension Father   . Arthritis Maternal Grandmother   . Hyperlipidemia Maternal Grandmother   . Hypertension Maternal Grandmother   . Arthritis Maternal Grandfather   . Hyperlipidemia Maternal Grandfather   . Heart disease Maternal Grandfather   . Hypertension Maternal Grandfather   . Arthritis Paternal Grandmother   . Breast cancer Paternal Grandmother   . Hypertension Paternal Grandmother   . Arthritis Paternal Grandfather   . Hyperlipidemia Paternal Grandfather   . Heart disease Paternal  Grandfather   . Diabetes Paternal Grandfather   . Hypertension Paternal Grandfather   . Diabetes Sister    Social History   Socioeconomic History  . Marital status: Widowed    Spouse name: None  . Number of children: None  . Years of education: None  . Highest education level: None  Social Needs  . Financial resource strain: None  . Food insecurity - worry: None  . Food insecurity - inability: None  . Transportation needs - medical: None  . Transportation needs - non-medical: None  Occupational History  . None  Tobacco Use  . Smoking status: Never Smoker  . Smokeless tobacco: Never Used  Substance and Sexual Activity  . Alcohol use: Yes    Alcohol/week: 0.0 oz    Comment: 1 drink every 2 months  . Drug use: No  . Sexual activity: None  Other Topics Concern  . None  Social History Narrative  . None    Outpatient Encounter Medications as of 12/16/2017  Medication Sig  . amLODipine (NORVASC) 5 MG tablet Take 1 tablet (5 mg total) by mouth daily.  [place] Kitchen lisinopril (PRINIVIL,ZESTRIL) 40 MG tablet Take 1 tablet (40 mg total) by mouth daily.  Mar[place] Kitchen loratadine (CLARITIN) 10 MG tablet Take 10 mg by mouth daily as needed for allergies.  . ranitidine (ZANTAC) 150 MG tablet Take 150 mg by  mouth at bedtime as needed for heartburn.  . rosuvastatin (CRESTOR) 10 MG tablet TAKE 1 TABLET BY MOUTH  DAILY   No facility-administered encounter medications on file as of 12/16/2017.     Review of Systems  Constitutional:       Not watching his diet.  Weight gain.   HENT: Negative for congestion and sinus pressure.   Respiratory: Negative for cough, chest tightness and shortness of breath.   Cardiovascular: Negative for chest pain and palpitations.       Leg swelling is better.   Gastrointestinal: Negative for abdominal pain, diarrhea, nausea and vomiting.  Genitourinary: Negative for difficulty urinating and dysuria.  Musculoskeletal: Negative for joint swelling and myalgias.  Skin: Negative  for color change and rash.  Neurological: Negative for dizziness, light-headedness and headaches.  Psychiatric/Behavioral: Negative for agitation and dysphoric mood.       Objective:     Blood pressure rechecked by me:  144/82  Physical Exam  Constitutional: He appears well-developed and well-nourished. No distress.  HENT:  Nose: Nose normal.  Mouth/Throat: Oropharynx is clear and moist.  Neck: Neck supple. No thyromegaly present.  Cardiovascular: Normal rate and regular rhythm.  Pulmonary/Chest: Effort normal and breath sounds normal. No respiratory distress.  Abdominal: Soft. Bowel sounds are normal. There is no tenderness.  Musculoskeletal: He exhibits no tenderness.  Decreased edema.   Feet:  Intact to light touch and pin prick.  No lesions.  Lymphadenopathy:    He has no cervical adenopathy.  Skin: No rash noted. No erythema.  Psychiatric: He has a normal mood and affect. His behavior is normal.    BP (!) 144/82   Pulse 83   Temp 98.5 F (36.9 C) (Oral)   Resp 20   Wt (!) 321 lb (145.6 kg)   SpO2 95%   BMI 42.35 kg/m  Wt Readings from Last 3 Encounters:  12/16/17 (!) 321 lb (145.6 kg)  09/20/17 (!) 305 lb (138.3 kg)  09/16/17 (!) 306 lb 12.8 oz (139.2 kg)     Lab Results  Component Value Date   WBC 6.9 02/24/2017   HGB 14.0 02/24/2017   HCT 43.0 02/24/2017   PLT 231 02/24/2017   GLUCOSE 119 (H) 02/24/2017   CHOL 174 07/01/2017   TRIG 59 07/01/2017   HDL 44 07/01/2017   LDLCALC 118 07/01/2017   ALT 28 02/24/2017   AST 23 02/24/2017   NA 144 02/24/2017   K 3.7 02/24/2017   CL 102 02/24/2017   CREATININE 0.9 07/01/2017   BUN 25 (H) 02/24/2017   CO2 24 02/24/2017   TSH 1.630 02/24/2017   HGBA1C 5.9 07/01/2017    Dg Lumbar Spine 2-3 Views  Result Date: 09/17/2015 CLINICAL DATA:  Lower back pain. EXAM: LUMBAR SPINE - 2-3 VIEW COMPARISON:  None. FINDINGS: Multilevel spondylosis noted throughout the visualized spine. Moderate proliferative changes  are present at T12-L1 and L1-L2. There is suggestion of a minimal retrolisthesis of L2 on L3 of approximately 4-5 mm. Mild proliferative changes are present at L3-4. Moderate facet hypertrophy present at L4-5 and L5-S1. No acute fracture identified. No bony lesions are seen. 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 There is mild retrolisthesis of L2 on L3 by approximately 4-5 mm. Electronically Signed   By: Aletta Edouard M.D.   On: 09/17/2015 09:02       Assessment & Plan:   Problem List Items Addressed This Visit    Anemia    Follow cbc.  BMI 40.0-44.9, adult [facility])    Discussed the need for diet and exercise.  Follow.       Chronic venous insufficiency    Continue compression stockings.  Follow.       Relevant Medications   amLODipine (NORVASC) 5 MG tablet   Essential hypertension    Blood pressure elevated.  Add amlodipine 26m q day.  Follow pressures.  Continue lisinopril.  Follow metabolic panel.        Relevant Medications   amLODipine (NORVASC) 5 MG tablet   GERD (gastroesophageal reflux disease)    Controlled on current regimen.        Hypercholesterolemia    On crestor.  Low cholesterol diet and exercise.  Follow lipid panel and liver function tests.        Relevant Medications   amLODipine (NORVASC) 5 MG tablet   Secondary diabetes with peripheral circulatory disorder (HCC)    Low carb diet and exercise.  Discussed the need for weight loss.  Follow met b and a1c.        Vitamin D deficiency    Follow vitamin D level.            SEinar Pheasant MD

## 2017-12-16 NOTE — Telephone Encounter (Signed)
Patient calling to see if he was to get these labs before appt today? [phone]

## 2017-12-18 ENCOUNTER — Encounter: Payer: Self-pay | Admitting: Internal Medicine

## 2017-12-18 NOTE — Assessment & Plan Note (Signed)
Blood pressure elevated.  Add amlodipine 5mg  q day.  Follow pressures.  Continue lisinopril.  Follow metabolic panel.

## 2017-12-18 NOTE — Assessment & Plan Note (Signed)
Discussed the need for diet and exercise.  Follow.

## 2017-12-18 NOTE — Assessment & Plan Note (Signed)
Continue compression stockings.  Follow.

## 2017-12-18 NOTE — Assessment & Plan Note (Signed)
Low carb diet and exercise.  Discussed the need for weight loss.  Follow met b and a1c.

## 2017-12-18 NOTE — Assessment & Plan Note (Signed)
Controlled on current regimen.   

## 2017-12-18 NOTE — Assessment & Plan Note (Signed)
Follow cbc.  

## 2017-12-18 NOTE — Assessment & Plan Note (Signed)
Follow vitamin D level.  

## 2017-12-18 NOTE — Assessment & Plan Note (Signed)
On crestor.  Low cholesterol diet and exercise.  Follow lipid panel and liver function tests.   

## 2018-01-12 ENCOUNTER — Telehealth: Payer: Self-pay | Admitting: Internal Medicine

## 2018-01-12 NOTE — Telephone Encounter (Signed)
Copied from [place] [phone]. Topic: General - Other >> Jan 12, 2018  9:51 AM [name] wrote: Reason for CRM: PT was in the office in January to see Dr Nicki Reaper and was to have labs but he was sick so he didn't stick around to have the labs drawn,  he is going to have those fasting labs tomorrow morning at [facility] and he called to make sure the orders are in-  the orders I see are from  October so  I wasn't sure if those are the correct orders or if he needs orders, he did not know which labs he was to have drawn.

## 2018-01-14 LAB — BASIC METABOLIC PANEL
BUN/Creatinine Ratio: 21 — ABNORMAL HIGH (ref 9–20)
BUN: 17 mg/dL (ref 6–24)
CALCIUM: 8.9 mg/dL (ref 8.7–10.2)
CHLORIDE: 103 mmol/L (ref 96–106)
CO2: 23 mmol/L (ref 20–29)
CREATININE: 0.8 mg/dL (ref 0.76–1.27)
GFR, EST AFRICAN AMERICAN: 115 mL/min/{1.73_m2} (ref >59)
GFR, EST NON AFRICAN AMERICAN: 100 mL/min/{1.73_m2} (ref >59)
Glucose: 168 mg/dL — ABNORMAL HIGH (ref 65–99)
Potassium: 3.8 mmol/L (ref 3.5–5.2)
Sodium: 142 mmol/L (ref 134–144)

## 2018-01-14 LAB — LIPID PANEL
Chol/HDL Ratio: 3.2 ratio (ref 0.0–5.0)
Cholesterol, Total: 147 mg/dL (ref 100–199)
HDL: 46 mg/dL (ref >39)
LDL Calculated: 83 mg/dL (ref 0–99)
Triglycerides: 90 mg/dL (ref 0–149)
VLDL CHOLESTEROL CAL: 18 mg/dL (ref 5–40)

## 2018-01-14 LAB — HEPATIC FUNCTION PANEL
ALK PHOS: 78 IU/L (ref 39–117)
ALT: 46 IU/L — AB (ref 0–44)
AST: 28 IU/L (ref 0–40)
Albumin: 4.1 g/dL (ref 3.5–5.5)
BILIRUBIN, DIRECT: 0.12 mg/dL (ref 0.00–0.40)
Bilirubin Total: 0.3 mg/dL (ref 0.0–1.2)
Total Protein: 7.1 g/dL (ref 6.0–8.5)

## 2018-01-14 LAB — HEMOGLOBIN A1C
Est. average glucose Bld gHb Est-mCnc: 151 mg/dL
HEMOGLOBIN A1C: 6.9 % — AB (ref 4.8–5.6)

## 2018-01-14 LAB — VITAMIN D 25 HYDROXY (VIT D DEFICIENCY, FRACTURES): Vit D, 25-Hydroxy: 12.6 ng/mL — ABNORMAL LOW (ref 30.0–100.0)

## 2018-01-16 ENCOUNTER — Telehealth: Payer: Self-pay | Admitting: Internal Medicine

## 2018-01-16 ENCOUNTER — Encounter: Payer: Self-pay | Admitting: Internal Medicine

## 2018-01-16 ENCOUNTER — Other Ambulatory Visit: Payer: Self-pay | Admitting: Internal Medicine

## 2018-01-16 DIAGNOSIS — R7989 Other specified abnormal findings of blood chemistry: Secondary | ICD-10-CM

## 2018-01-16 DIAGNOSIS — E119 Type 2 diabetes mellitus without complications: Secondary | ICD-10-CM

## 2018-01-16 DIAGNOSIS — R945 Abnormal results of liver function studies: Secondary | ICD-10-CM

## 2018-01-16 MED ORDER — VITAMIN D (ERGOCALCIFEROL) 1.25 MG (50000 UNIT) PO CAPS: 50000.0000 [IU] | ORAL_CAPSULE | ORAL | 0 refills | Status: DC

## 2018-01-16 NOTE — Telephone Encounter (Signed)
Order placed for f/u liver panel to be drawn at [facility].

## 2018-01-16 NOTE — Progress Notes (Signed)
Order placed for [facility] referral.  Also have already send in vitamin D and ordered f/u liver panel.

## 2018-01-16 NOTE — Progress Notes (Signed)
rx sent in for ergocalciferol 50,000 units q week #12 with no refills.

## 2018-01-20 ENCOUNTER — Other Ambulatory Visit: Payer: Self-pay | Admitting: Internal Medicine

## 2018-01-20 MED ORDER — AMLODIPINE BESYLATE 5 MG PO TABS: 5.0000 mg | ORAL_TABLET | Freq: Every day | ORAL | 2 refills | Status: DC

## 2018-01-20 NOTE — Telephone Encounter (Signed)
Patient called and states he was started on amlodipine in January, he had a 30 day supply sent to pharmacy in order to start it right away. He would like 90 day sent to pharmacy. Sent to pharmacy

## 2018-01-20 NOTE — Addendum Note (Signed)
Addended by: Juanda Reep on: 01/20/2018 03:48 PM   Modules accepted: Orders

## 2018-01-20 NOTE — Telephone Encounter (Signed)
Copied from [place]. Topic: Quick Communication - See Telephone Encounter >> Jan 20, 2018  3:33 PM Vernona Rieger wrote: CRM for notification. See Telephone encounter for:   01/20/18.  Patient wants to know if he orders 30 or 90 day supply for all his meds through [facility]? Please advise  [phone]

## 2018-02-06 ENCOUNTER — Ambulatory Visit: Payer: Managed Care, Other (non HMO) | Admitting: Internal Medicine

## 2018-02-06 ENCOUNTER — Encounter: Payer: Self-pay | Admitting: Internal Medicine

## 2018-02-06 DIAGNOSIS — E559 Vitamin D deficiency, unspecified: Secondary | ICD-10-CM | POA: Diagnosis not present

## 2018-02-06 DIAGNOSIS — E7800 Pure hypercholesterolemia, unspecified: Secondary | ICD-10-CM | POA: Diagnosis not present

## 2018-02-06 DIAGNOSIS — I10 Essential (primary) hypertension: Secondary | ICD-10-CM | POA: Diagnosis not present

## 2018-02-06 DIAGNOSIS — Z6841 Body Mass Index (BMI) 40.0 and over, adult: Secondary | ICD-10-CM

## 2018-02-06 DIAGNOSIS — L97809 Non-pressure chronic ulcer of other part of unspecified lower leg with unspecified severity: Secondary | ICD-10-CM

## 2018-02-06 DIAGNOSIS — I872 Venous insufficiency (chronic) (peripheral): Secondary | ICD-10-CM

## 2018-02-06 DIAGNOSIS — D649 Anemia, unspecified: Secondary | ICD-10-CM | POA: Diagnosis not present

## 2018-02-06 DIAGNOSIS — R600 Localized edema: Secondary | ICD-10-CM | POA: Diagnosis not present

## 2018-02-06 DIAGNOSIS — I83008 Varicose veins of unspecified lower extremity with ulcer other part of lower leg: Secondary | ICD-10-CM | POA: Diagnosis not present

## 2018-02-06 DIAGNOSIS — E1351 Other specified diabetes mellitus with diabetic peripheral angiopathy without gangrene: Secondary | ICD-10-CM

## 2018-02-06 MED ORDER — HYDROCHLOROTHIAZIDE 12.5 MG PO CAPS: 12.5000 mg | ORAL_CAPSULE | Freq: Every day | ORAL | 1 refills | Status: DC

## 2018-02-06 NOTE — Progress Notes (Signed)
[name], male   DOB: [birth year], 57 y.o.   MRN: [id number]   Subjective:    [name], male    DOB: [birth year], 57 y.o.   MRN: [id number]  HPI  Patient here for a scheduled follow up.  Has gained weight.  Discussed diet and exercise.  Has started decreasing carb intake.  Increased lower extremity swelling.  Has an open area on his lower leg.  No chest pain.  Breathing stable.  No acid reflux.  No abdominal pain.  Bowels moving.  Blood pressure elevated last visit.  Added amlodipine.  Discussed recent labs.  Sugar elevated.  Referred to [facility].  Has appt 02/15/18.  He reports his pressures are averaging 371-062I systolic.     Past Medical History:  Diagnosis Date  . 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 Depression   . Environmental allergies   . Family history of colon cancer   . GERD (gastroesophageal reflux disease)   . History of chicken pox   . Hypertension   . Venous stasis   . Vitamin D deficiency    Past Surgical History:  Procedure Laterality Date  . CHOLECYSTECTOMY  2002  . COLONOSCOPY WITH PROPOFOL N/A 06/27/2015   Procedure: COLONOSCOPY WITH PROPOFOL;  Surgeon: Lollie Sails, MD;  Location: [facility] ENDOSCOPY;  Service: Endoscopy;  Laterality: N/A;  . HERNIA REPAIR  1962   Family History  Problem Relation Age of Onset  . Stomach cancer Mother   . Arthritis Mother   . Diabetes Mother   . Hypertension Mother   . Hyperlipidemia Father   . Heart disease Father   . Diabetes Father   . Hypertension Father   . Arthritis Maternal Grandmother   . Hyperlipidemia Maternal Grandmother   . Hypertension Maternal Grandmother   . Arthritis Maternal Grandfather   . Hyperlipidemia Maternal Grandfather   . Heart disease Maternal Grandfather   . Hypertension Maternal Grandfather   . Arthritis Paternal Grandmother   . Breast cancer Paternal Grandmother   .  Hypertension Paternal Grandmother   . Arthritis Paternal Grandfather   . Hyperlipidemia Paternal Grandfather   . Heart disease Paternal Grandfather   . Diabetes Paternal Grandfather   . Hypertension Paternal Grandfather   . Diabetes Sister    Social History   Socioeconomic History  . Marital status: Widowed    Spouse name: Not on file  . Number of children: Not on file  . Years of education: Not on file  . Highest education level: Not on file  Occupational History  . Not on file  Social Needs  . Financial resource strain: Not on file  . Food insecurity:    Worry: Not on file    Inability: Not on file  . Transportation needs:    Medical: Not on file    Non-medical: Not on file  Tobacco Use  . Smoking status: Never Smoker  . Smokeless tobacco: Never Used  Substance and Sexual Activity  . Alcohol use: Yes    Alcohol/week: 0.0 oz    Comment: 1 drink every 2 months  . Drug use: No  . Sexual activity: Not on file  Lifestyle  . Physical activity:    Days per week: Not on file    Minutes per session: Not on file  . Stress: Not on file  Relationships  . Social connections:    Talks on  phone: Not on file    Gets together: Not on file    Attends religious service: Not on file    Active member of club or organization: Not on file    Attends meetings of clubs or organizations: Not on file    Relationship status: Not on file  Other Topics Concern  . Not on file  Social History Narrative  . Not on file    Outpatient Encounter Medications as of 02/06/2018  Medication Sig  . lisinopril (PRINIVIL,ZESTRIL) 40 MG tablet Take 1 tablet (40 mg total) by mouth daily.  Marland Kitchen loratadine (CLARITIN) 10 MG tablet Take 10 mg by mouth daily as needed for allergies.  . ranitidine (ZANTAC) 150 MG tablet Take 150 mg by mouth at bedtime as needed for heartburn.  . rosuvastatin (CRESTOR) 10 MG tablet TAKE 1 TABLET BY MOUTH  DAILY  . Vitamin D, Ergocalciferol, (DRISDOL) 50000 units CAPS capsule  Take 1 capsule (50,000 Units total) by mouth every 7 (seven) days.  . [DISCONTINUED] amLODipine (NORVASC) 5 MG tablet Take 1 tablet (5 mg total) by mouth daily.  . hydrochlorothiazide (MICROZIDE) 12.5 MG capsule Take 1 capsule (12.5 mg total) by mouth daily.   No facility-administered encounter medications on file as of 02/06/2018.     Review of Systems  Constitutional: Negative for appetite change.       Increased weight.    HENT: Negative for congestion and sinus pressure.   Respiratory: Negative for cough and chest tightness.        Breathing stable.    Cardiovascular: Positive for leg swelling. Negative for chest pain and palpitations.  Gastrointestinal: Negative for abdominal pain, diarrhea, nausea and vomiting.  Genitourinary: Negative for difficulty urinating and dysuria.  Musculoskeletal: Negative for joint swelling and myalgias.  Skin: Negative for rash.       Open area lower leg.  No surrounding erythema.    Neurological: Negative for dizziness, light-headedness and headaches.  Psychiatric/Behavioral: Negative for agitation and dysphoric mood.       Objective:     Blood pressure rechecked by me:  136/78  Physical Exam  Constitutional: He appears well-developed and well-nourished. No distress.  HENT:  Nose: Nose normal.  Mouth/Throat: Oropharynx is clear and moist.  Neck: Neck supple. No thyromegaly present.  Cardiovascular: Normal rate and regular rhythm.  Pulmonary/Chest: Effort normal and breath sounds normal. No respiratory distress.  Abdominal: Soft. Bowel sounds are normal. There is no tenderness.  Musculoskeletal:  Increased pedal and ankle edema.  No increased erythema.    Lymphadenopathy:    He has no cervical adenopathy.  Skin: No erythema.  Open area - lower leg.  No surrounding erythema.    Psychiatric: He has a normal mood and affect. His behavior is normal.    BP 136/78   Pulse 88   Temp 98.2 F (36.8 C) (Oral)   Resp 15   Wt (!) 327 lb 4 oz  (148.4 kg)   SpO2 95%   BMI 43.18 kg/m  Wt Readings from Last 3 Encounters:  02/06/18 (!) 327 lb 4 oz (148.4 kg)  12/16/17 (!) 321 lb (145.6 kg)  09/20/17 (!) 305 lb (138.3 kg)     Lab Results  Component Value Date   WBC 6.9 02/24/2017   HGB 14.0 02/24/2017   HCT 43.0 02/24/2017   PLT 231 02/24/2017   GLUCOSE 168 (H) 01/13/2018   CHOL 147 01/13/2018   TRIG 90 01/13/2018   HDL 46 01/13/2018   LDLCALC 83 01/13/2018  ALT 46 (H) 01/13/2018   AST 28 01/13/2018   NA 142 01/13/2018   K 3.8 01/13/2018   CL 103 01/13/2018   CREATININE 0.80 01/13/2018   BUN 17 01/13/2018   CO2 23 01/13/2018   TSH 1.630 02/24/2017   HGBA1C 6.9 (H) 01/13/2018    Dg Lumbar Spine 2-3 Views  Result Date: 09/17/2015 CLINICAL DATA:  Lower back pain. EXAM: LUMBAR SPINE - 2-3 VIEW COMPARISON:  None. FINDINGS: Multilevel spondylosis noted throughout the visualized spine. Moderate proliferative changes are present at T12-L1 and L1-L2. There is suggestion of a minimal retrolisthesis of L2 on L3 of approximately 4-5 mm. Mild proliferative changes are present at L3-4. Moderate facet hypertrophy present at L4-5 and L5-S1. No acute fracture identified. No bony lesions are seen. 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 There is mild retrolisthesis of L2 on L3 by approximately 4-5 mm. Electronically Signed   By: Aletta Edouard M.D.   On: 09/17/2015 09:02       Assessment & Plan:   Problem List Items Addressed This Visit    Anemia    Follow cbc.       Relevant Orders   CBC with Differential/Platelet   Vitamin B12   BMI 40.0-44.9, adult ([place])    Discussed diet and exercise and need for weight loss.  Follow.       Chronic venous insufficiency    Has chronic venous insufficiency.  Now with increased leg swelling.  Has been wearing compression hose.  Open area - lower extremity.  Refer back to wound center.  Pt in agreement.        Relevant Medications    hydrochlorothiazide (MICROZIDE) 12.5 MG capsule   Other Relevant Orders   Ambulatory referral to Wound Clinic   Essential hypertension    Blood pressure on recheck improved.  Unclear if amlodipine aggravating the swelling.  Will stop amlodipine.  Start hctz 12.30m q day.  Follow pressures.  Follow metabolic panel.        Relevant Medications   hydrochlorothiazide (MICROZIDE) 12.5 MG capsule   Other Relevant Orders   TSH   Basic metabolic panel   Hypercholesterolemia    On crestor.  Low cholesterol diet and exercise.  Follow lipid panel and liver function tests.        Relevant Medications   hydrochlorothiazide (MICROZIDE) 12.5 MG capsule   Other Relevant Orders   Hepatic function panel   Lipid panel   Lower extremity edema    Increased swelling as outlined.  Worsened.  Stop amlodipine.  Elevate legs when sitting.  Wears compression hose.  With increased swelling and now with open lesion on leg, refer back to wound clinic for evaluation and treatment.        Secondary diabetes with peripheral circulatory disorder (HCC)    Low carb diet and exercise.  Follow met b and a1c.  Has appt with [facility] 02/15/18.        Relevant Orders   Hemoglobin A1c   Venous stasis ulcer ([place]    Has open area on lower extremity.  Refer to wound clinic.  Follow.        Relevant Orders   Ambulatory referral to Wound Clinic   Vitamin D deficiency    Follow vitamin D level.            SEinar Pheasant MD

## 2018-02-06 NOTE — Patient Instructions (Signed)
Stop amlodipine  Start hctz 12.5mg  - one per day.

## 2018-02-10 ENCOUNTER — Telehealth: Payer: Self-pay

## 2018-02-10 ENCOUNTER — Encounter: Payer: Self-pay | Admitting: Internal Medicine

## 2018-02-10 DIAGNOSIS — R600 Localized edema: Secondary | ICD-10-CM | POA: Insufficient documentation

## 2018-02-10 NOTE — Assessment & Plan Note (Signed)
Increased swelling as outlined.  Worsened.  Stop amlodipine.  Elevate legs when sitting.  Wears compression hose.  With increased swelling and now with open lesion on leg, refer back to wound clinic for evaluation and treatment.

## 2018-02-10 NOTE — Assessment & Plan Note (Signed)
Discussed diet and exercise and need for weight loss.  Follow.

## 2018-02-10 NOTE — Telephone Encounter (Signed)
Copied from [place]. Topic: Appointment Scheduling - Scheduling Inquiry for Clinic >> Feb 10, 2018 11:12 AM [name] wrote: Reason for CRM: pt called to schedule his 6wk f/u, pt is needing a 4 oclock but the slots available for that are a same day closer to May, contact pt to schedule to get fit in for a f/u

## 2018-02-10 NOTE — Assessment & Plan Note (Signed)
Follow vitamin D level.  

## 2018-02-10 NOTE — Assessment & Plan Note (Signed)
Blood pressure on recheck improved.  Unclear if amlodipine aggravating the swelling.  Will stop amlodipine.  Start hctz 12.5mg  q day.  Follow pressures.  Follow metabolic panel.

## 2018-02-10 NOTE — Assessment & Plan Note (Signed)
On crestor.  Low cholesterol diet and exercise.  Follow lipid panel and liver function tests.   

## 2018-02-10 NOTE — Telephone Encounter (Signed)
Please schedule

## 2018-02-10 NOTE — Assessment & Plan Note (Signed)
Low carb diet and exercise.  Follow met b and a1c.  Has appt with [facility] 02/15/18.

## 2018-02-10 NOTE — Assessment & Plan Note (Signed)
Has open area on lower extremity.  Refer to wound clinic.  Follow.

## 2018-02-10 NOTE — Assessment & Plan Note (Signed)
Has chronic venous insufficiency.  Now with increased leg swelling.  Has been wearing compression hose.  Open area - lower extremity.  Refer back to wound center.  Pt in agreement.

## 2018-02-10 NOTE — Assessment & Plan Note (Signed)
Follow cbc.  

## 2018-02-13 NOTE — Telephone Encounter (Signed)
Pt called [facility] asking for [name], contact pt to advise, pt apparently calling to get appt scheduled

## 2018-02-15 ENCOUNTER — Ambulatory Visit: Payer: Managed Care, Other (non HMO) | Admitting: *Deleted

## 2018-02-16 ENCOUNTER — Encounter: Payer: Managed Care, Other (non HMO) | Attending: Physician Assistant | Admitting: Nurse Practitioner

## 2018-02-16 DIAGNOSIS — E11621 Type 2 diabetes mellitus with foot ulcer: Secondary | ICD-10-CM | POA: Insufficient documentation

## 2018-02-16 DIAGNOSIS — Z8249 Family history of ischemic heart disease and other diseases of the circulatory system: Secondary | ICD-10-CM | POA: Diagnosis not present

## 2018-02-16 DIAGNOSIS — L97919 Non-pressure chronic ulcer of unspecified part of right lower leg with unspecified severity: Secondary | ICD-10-CM | POA: Diagnosis not present

## 2018-02-16 DIAGNOSIS — E1140 Type 2 diabetes mellitus with diabetic neuropathy, unspecified: Secondary | ICD-10-CM | POA: Diagnosis not present

## 2018-02-16 DIAGNOSIS — Z823 Family history of stroke: Secondary | ICD-10-CM | POA: Insufficient documentation

## 2018-02-16 DIAGNOSIS — I10 Essential (primary) hypertension: Secondary | ICD-10-CM | POA: Insufficient documentation

## 2018-02-16 DIAGNOSIS — E1151 Type 2 diabetes mellitus with diabetic peripheral angiopathy without gangrene: Secondary | ICD-10-CM | POA: Insufficient documentation

## 2018-02-17 NOTE — Progress Notes (Signed)
KANAAN, KAGAWA ([id number]) Visit Report for 02/16/2018 Allergy List Details Patient Name: Messler, Jean T. Date of Service: 02/16/2018 8:00 AM Medical Record Number: [id number] Patient Account Number: [id number] Date of Birth/Sex: [birth year] (57 y.o. M) Treating RN: Montey Hora Primary Care Zavion Sleight: [name] Other Clinician: Referring Edris Schneck: Referral, Self Treating Adalay Azucena/Extender: Cathie Olden in Treatment: 0 Allergies Active Allergies No Known Drug Allergies Allergy Notes Electronic Signature(s) Signed: 02/16/2018 4:03:23 PM By: Montey Hora Entered By: Montey Hora on 02/16/2018 08:13:28 Kuehnel, Jolene Schimke ([id number]) -------------------------------------------------------------------------------- Arrival Information Details Patient Name: Kreitz, Paulmichael T. Date of Service: 02/16/2018 8:00 AM Medical Record Number: [id number] Patient Account Number: [id number] Date of Birth/Sex: 1961-01-21 (57 y.o. M) Treating RN: Montey Hora Primary Care Atziry Baranski: [name] Other Clinician: Referring Evonne Rinks: Referral, Self Treating Neyra Pettie/Extender: Cathie Olden in Treatment: 0 Visit Information Patient Arrived: Ambulatory Arrival Time: 08:08 Accompanied By: self Transfer Assistance: None Patient Identification Verified: Yes Secondary Verification Process Completed: Yes Patient Has Alerts: Yes Patient Alerts: DMII History Since Last Visit Added or deleted any medications: No Any new allergies or adverse reactions: No Had a fall or experienced change in activities of daily living that may affect risk of falls: No Signs or symptoms of abuse/neglect since last visito No Hospitalized since last visit: No Implantable device outside of the clinic excluding cellular tissue based products placed in the center since last visit: No Has Dressing in Place as Prescribed: No Has Compression in Place as Prescribed: Yes Electronic Signature(s) Signed:  02/16/2018 4:03:23 PM By: Montey Hora Entered By: Montey Hora on 02/16/2018 08:12:58 Luce, Jolene Schimke ([id number]) -------------------------------------------------------------------------------- Clinic Level of Care Assessment Details Patient Name: Toelle, Isiaah T. Date of Service: 02/16/2018 8:00 AM Medical Record Number: [id number] Patient Account Number: [id number] Date of Birth/Sex: [birth year] (57 y.o. M) Treating RN: Ahmed Prima Primary Care Paizley Ramella: [name] Other Clinician: Referring Deaundre Allston: Referral, Self Treating Teigan Manner/Extender: Cathie Olden in Treatment: 0 Clinic Level of Care Assessment Items TOOL 2 Quantity Score X - Use when only an EandM is performed on the INITIAL visit 1 0 ASSESSMENTS - Nursing Assessment / Reassessment X - General Physical Exam (combine w/ comprehensive assessment (listed just below) when 1 20 performed on new pt. evals) X- 1 25 Comprehensive Assessment (HX, ROS, Risk Assessments, Wounds Hx, etc.) ASSESSMENTS - Wound and Skin Assessment / Reassessment X - Simple Wound Assessment / Reassessment - one wound 1 5 []  - 0 Complex Wound Assessment / Reassessment - multiple wounds []  - 0 Dermatologic / Skin Assessment (not related to wound area) ASSESSMENTS - Ostomy and/or Continence Assessment and Care []  - Incontinence Assessment and Management 0 []  - 0 Ostomy Care Assessment and Management (repouching, etc.) PROCESS - Coordination of Care X - Simple Patient / Family Education for ongoing care 1 15 []  - 0 Complex (extensive) Patient / Family Education for ongoing care []  - 0 Staff obtains Programmer, systems, Records, Test Results / Process Orders []  - 0 Staff telephones HHA, Nursing Homes / Clarify orders / etc []  - 0 Routine Transfer to another Facility (non-emergent condition) []  - 0 Routine Hospital Admission (non-emergent condition) X- 1 15 New Admissions / Biomedical engineer / Ordering NPWT, Apligraf, etc. []  -  0 Emergency Hospital Admission (emergent condition) X- 1 10 Simple Discharge Coordination []  - 0 Complex (extensive) Discharge Coordination PROCESS - Special Needs []  - Pediatric / Minor Patient Management 0 []  - 0 Isolation Patient Management Brafford, Codie T. ([id number]) []  - 0 Hearing / Language / Visual special  needs []  - 0 Assessment of Community assistance (transportation, D/C planning, etc.) []  - 0 Additional assistance / Altered mentation []  - 0 Support Surface(s) Assessment (bed, cushion, seat, etc.) INTERVENTIONS - Wound Cleansing / Measurement X - Wound Imaging (photographs - any number of wounds) 1 5 []  - 0 Wound Tracing (instead of photographs) X- 1 5 Simple Wound Measurement - one wound []  - 0 Complex Wound Measurement - multiple wounds X- 1 5 Simple Wound Cleansing - one wound []  - 0 Complex Wound Cleansing - multiple wounds INTERVENTIONS - Wound Dressings X - Small Wound Dressing one or multiple wounds 1 10 []  - 0 Medium Wound Dressing one or multiple wounds []  - 0 Large Wound Dressing one or multiple wounds []  - 0 Application of Medications - injection INTERVENTIONS - Miscellaneous []  - External ear exam 0 []  - 0 Specimen Collection (cultures, biopsies, blood, body fluids, etc.) []  - 0 Specimen(s) / Culture(s) sent or taken to Lab for analysis []  - 0 Patient Transfer (multiple staff / Civil Service fast streamer / Similar devices) []  - 0 Simple Staple / Suture removal (25 or less) []  - 0 Complex Staple / Suture removal (26 or more) []  - 0 Hypo / Hyperglycemic Management (close monitor of Blood Glucose) X- 1 15 Ankle / Brachial Index (ABI) - do not check if billed separately Has the patient been seen at the hospital within the last three years: Yes Total Score: 130 Level Of Care: New/Established - Level 4 Electronic Signature(s) Signed: 02/16/2018 4:15:03 PM By: Alric Quan Entered By: Alric Quan on 02/16/2018 09:56:56 Blount, Jolene Schimke  ([id number]) -------------------------------------------------------------------------------- Encounter Discharge Information Details Patient Name: Komar, Kalven T. Date of Service: 02/16/2018 8:00 AM Medical Record Number: [id number] Patient Account Number: [id number] Date of Birth/Sex: 1961-02-14 (57 y.o. M) Treating RN: Ahmed Prima Primary Care Soniya Ashraf: [name] Other Clinician: Referring Timo Hartwig: Referral, Self Treating Kratos Ruscitti/Extender: Cathie Olden in Treatment: 0 Encounter Discharge Information Items Discharge Pain Level: 0 Discharge Condition: Stable Ambulatory Status: Ambulatory Discharge Destination: Home Transportation: Private Auto Schedule Follow-up Appointment: Yes Medication Reconciliation completed and No provided to Patient/Care Javani Spratt: Provided on Clinical Summary of Care: 02/16/2018 Form Type Recipient Paper Patient CC Electronic Signature(s) Signed: 02/16/2018 9:25:45 AM By: Roger Shelter Entered By: Roger Shelter on 02/16/2018 09:12:14 Fadeley, Jolene Schimke ([id number]) -------------------------------------------------------------------------------- Lower Extremity Assessment Details Patient Name: Bomar, Jerric T. Date of Service: 02/16/2018 8:00 AM Medical Record Number: [id number] Patient Account Number: [id number] Date of Birth/Sex: [birth year] (57 y.o. M) Treating RN: Montey Hora Primary Care [employer]: [name] Other Clinician: Referring Zameer Borman: Referral, Self Treating Zainah Steven/Extender: Cathie Olden in Treatment: 0 Edema Assessment Assessed: [Left: No] [Right: No] Edema: [Left: Yes] [Right: Yes] Calf Left: Right: Point of Measurement: 37 cm From Medial Instep 45.5 cm 50.2 cm Ankle Left: Right: Point of Measurement: 13 cm From Medial Instep 30.8 cm 30.5 cm Vascular Assessment Pulses: Dorsalis Pedis Palpable: [Left:Yes] [Right:Yes] Posterior Tibial Palpable: [Left:Yes] [Right:Yes] Extremity colors,  hair growth, and conditions: Extremity Color: [Left:Hyperpigmented] [Right:Hyperpigmented] Hair Growth on Extremity: [Left:Yes] [Right:Yes] Temperature of Extremity: [Left:Warm] [Right:Warm] Capillary Refill: [Left:< 3 seconds] [Right:< 3 seconds] Blood Pressure: Brachial: [Left:146] [Right:142] Dorsalis Pedis: 160 [Left:Dorsalis Pedis: 172] Ankle: Posterior Tibial: 138 [Left:Posterior Tibial: 1.10] [Right:1.18] Toe Nail Assessment Left: Right: Thick: Yes Yes Discolored: No No Deformed: No No Improper Length and Hygiene: Yes Yes Electronic Signature(s) Signed: 02/16/2018 4:03:23 PM By: Montey Hora Entered By: Montey Hora on 02/16/2018 08:36:16 Sturkey, Jolene Schimke ([id number]) -------------------------------------------------------------------------------- Multi Wound Chart Details Patient Name:  Jaso, Jetson T. Date of Service: 02/16/2018 8:00 AM Medical Record Number: [id number] Patient Account Number: [id number] Date of Birth/Sex: [birth year] (57 y.o. M) Treating RN: Ahmed Prima Primary Care Bee Marchiano: [name] Other Clinician: Referring Deondra Wigger: Referral, Self Treating Nakisha Chai/Extender: Cathie Olden in Treatment: 0 Vital Signs Height(in): 73 Pulse(bpm): 82 Weight(lbs): 320 Blood Pressure(mmHg): 148/81 Body Mass Index(BMI): 42 Temperature(F): 98.3 Respiratory Rate 18 (breaths/min): Photos: [2:No Photos] [N/A:N/A] Wound Location: [2:Right Lower Leg - Medial] [N/A:N/A] Wounding Event: [2:Gradually Appeared] [N/A:N/A] Primary Etiology: [2:Venous Leg Ulcer] [N/A:N/A] Comorbid History: [2:Lymphedema, Hypertension, Peripheral Venous Disease, Type II Diabetes, Neuropathy] [N/A:N/A] Date Acquired: [2:01/16/2018] [N/A:N/A] Weeks of Treatment: [2:0] [N/A:N/A] Wound Status: [2:Open] [N/A:N/A] Measurements L x W x D [2:1.4x3.2x0.1] [N/A:N/A] (cm) Area (cm) : [2:3.519] [N/A:N/A] Volume (cm) : [2:0.352] [N/A:N/A] Classification: [2:Partial  Thickness] [N/A:N/A] Exudate Amount: [2:Medium] [N/A:N/A] Exudate Type: [2:Serous] [N/A:N/A] Exudate Color: [2:amber] [N/A:N/A] Wound Margin: [2:Flat and Intact] [N/A:N/A] Granulation Amount: [2:Medium (34-66%)] [N/A:N/A] Granulation Quality: [2:Pink] [N/A:N/A] Necrotic Amount: [2:Medium (34-66%)] [N/A:N/A] Exposed Structures: [2:Fascia: No Fat Layer (Subcutaneous Tissue) Exposed: No Tendon: No Muscle: No Joint: No Bone: No] [N/A:N/A] Epithelialization: [2:None] [N/A:N/A] Periwound Skin Texture: [2:Excoriation: No Induration: No Callus: No Crepitus: No Rash: No Scarring: No] [N/A:N/A] Periwound Skin Moisture: [N/A:N/A] Maceration: No Dry/Scaly: No Periwound Skin Color: Hemosiderin Staining: Yes N/A N/A Atrophie Blanche: No Cyanosis: No Ecchymosis: No Erythema: No Mottled: No Pallor: No Rubor: No Temperature: No Abnormality N/A N/A Tenderness on Palpation: No N/A N/A Wound Preparation: Ulcer Cleansing: N/A N/A Rinsed/Irrigated with Saline Topical Anesthetic Applied: Other: lidocaine 4% Treatment Notes Electronic Signature(s) Signed: 02/16/2018 9:08:30 AM By: Lawanda Cousins Entered By: Lawanda Cousins on 02/16/2018 09:08:30 Shishido, Jolene Schimke ([id number]) -------------------------------------------------------------------------------- [place] Details Patient Name: Bickle, Jojo T. Date of Service: 02/16/2018 8:00 AM Medical Record Number: [id number] Patient Account Number: [id number] Date of Birth/Sex: 07/17/1961 (57 y.o. M) Treating RN: Ahmed Prima Primary Care Jammal Sarr: [name] Other Clinician: Referring Daxter Paule: Referral, Self Treating Sallee Hogrefe/Extender: Cathie Olden in Treatment: 0 Active Inactive ` Orientation to the Wound Care Program Nursing Diagnoses: Knowledge deficit related to the wound healing center program Goals: Patient/caregiver will verbalize understanding of the [place] Program Date Initiated:  02/16/2018 Target Resolution Date: 02/25/2018 Goal Status: Active Interventions: Provide education on orientation to the wound center Notes: ` Pain, Acute or Chronic Nursing Diagnoses: Pain, acute or chronic: actual or potential Potential alteration in comfort, pain Goals: Patient/caregiver will verbalize adequate pain control between visits Date Initiated: 02/16/2018 Target Resolution Date: 05/27/2018 Goal Status: Active Interventions: Complete pain assessment as per visit requirements Notes: ` Wound/Skin Impairment Nursing Diagnoses: Impaired tissue integrity Knowledge deficit related to ulceration/compromised skin integrity Goals: Ulcer/skin breakdown will have a volume reduction of 80% by week 12 Date Initiated: 02/16/2018 Target Resolution Date: 05/20/2018 Goal Status: Active Deluna, Ezana T. ([id number]) Interventions: Assess patient/caregiver ability to perform ulcer/skin care regimen upon admission and as needed Assess ulceration(s) every visit Notes: Electronic Signature(s) Signed: 02/16/2018 4:15:03 PM By: Alric Quan Entered By: Alric Quan on 02/16/2018 08:43:44 Finau, Jolene Schimke ([id number]) -------------------------------------------------------------------------------- Pain Assessment Details Patient Name: Cowans, Montez T. Date of Service: 02/16/2018 8:00 AM Medical Record Number: [id number] Patient Account Number: [id number] Date of Birth/Sex: [birth year] (57 y.o. M) Treating RN: Montey Hora Primary Care Anshika Pethtel: [name] Other Clinician: Referring Luva Metzger: Referral, Self Treating Madisen Ludvigsen/Extender: Cathie Olden in Treatment: 0 Active Problems Location of Pain Severity and Description of Pain Patient Has Paino No Site Locations Pain Management and Medication Current Pain Management: Notes  Topical or injectable lidocaine is offered to patient for acute pain when surgical debridement is performed. If needed, Patient  is instructed to use over the counter pain medication for the following 24-48 hours after debridement. Wound care MDs do not prescribed pain medications. Patient has chronic pain or uncontrolled pain. Patient has been instructed to make an appointment with their Primary Care Physician for pain management. Electronic Signature(s) Signed: 02/16/2018 4:03:23 PM By: Montey Hora Entered By: Montey Hora on 02/16/2018 08:12:04 Matzek, Jolene Schimke ([id number]) -------------------------------------------------------------------------------- Patient/Caregiver Education Details Patient Name: Suriano, Daquann T. Date of Service: 02/16/2018 8:00 AM Medical Record Number: [id number] Patient Account Number: [id number] Date of Birth/Gender: May 03, 1961 (57 y.o. M) Treating RN: Roger Shelter Primary Care Physician: [name] Other Clinician: Referring Physician: Referral, Self Treating Physician/Extender: Cathie Olden in Treatment: 0 Education Assessment Education Provided To: Patient Education Topics Provided Welcome To [facility]: Handouts: [facility] To [facility] Methods: Explain/Verbal Responses: State content correctly Wound Debridement: Handouts: Wound Debridement Methods: Explain/Verbal Responses: State content correctly Wound/Skin Impairment: Methods: Explain/Verbal Responses: State content correctly Electronic Signature(s) Signed: 02/16/2018 9:25:45 AM By: Roger Shelter Entered By: Roger Shelter on 02/16/2018 09:12:36 Konitzer, Jolene Schimke ([id number]) -------------------------------------------------------------------------------- Wound Assessment Details Patient Name: Honse, Manasseh T. Date of Service: 02/16/2018 8:00 AM Medical Record Number: [id number] Patient Account Number: [id number] Date of Birth/Sex: 04/01/1961 (57 y.o. M) Treating RN: Montey Hora Primary Care Reni Hausner: [name] Other Clinician: Referring Tiffany Talarico: Referral,  Self Treating Roddrick Sharron/Extender: Cathie Olden in Treatment: 0 Wound Status Wound Number: 2 Primary Venous Leg Ulcer Etiology: Wound Location: Right Lower Leg - Medial Wound Open Wounding Event: Gradually Appeared Status: Date Acquired: 01/16/2018 Comorbid Lymphedema, Hypertension, Peripheral Weeks Of Treatment: 0 History: Venous Disease, Type II Diabetes, Neuropathy Clustered Wound: No Photos Photo Uploaded By: Montey Hora on 02/16/2018 09:55:17 Wound Measurements Length: (cm) 1.4 Width: (cm) 3.2 Depth: (cm) 0.1 Area: (cm) 3.519 Volume: (cm) 0.352 % Reduction in Area: % Reduction in Volume: Epithelialization: None Tunneling: No Undermining: No Wound Description Classification: Partial Thickness Wound Margin: Flat and Intact Exudate Amount: Medium Exudate Type: Serous Exudate Color: amber Foul Odor After Cleansing: No Slough/Fibrino Yes Wound Bed Granulation Amount: Medium (34-66%) Exposed Structure Granulation Quality: Pink Fascia Exposed: No Necrotic Amount: Medium (34-66%) Fat Layer (Subcutaneous Tissue) Exposed: No Necrotic Quality: Adherent Slough Tendon Exposed: No Muscle Exposed: No Joint Exposed: No Bone Exposed: No Periwound Skin Texture Pester, Nyzir T. ([id number]) Texture Color No Abnormalities Noted: No No Abnormalities Noted: No Callus: No Atrophie Blanche: No Crepitus: No Cyanosis: No Excoriation: No Ecchymosis: No Induration: No Erythema: No Rash: No Hemosiderin Staining: Yes Scarring: No Mottled: No Pallor: No Moisture Rubor: No No Abnormalities Noted: No Dry / Scaly: No Temperature / Pain Maceration: No Temperature: No Abnormality Wound Preparation Ulcer Cleansing: Rinsed/Irrigated with Saline Topical Anesthetic Applied: Other: lidocaine 4%, Treatment Notes Wound #2 (Right, Medial Lower Leg) 1. Cleansed with: Clean wound with Normal Saline 2. Anesthetic Topical Lidocaine 4% cream to wound bed prior to  debridement 4. Dressing Applied: Hydrafera Blue 5. Secondary Dressing Applied ABD Pad 7. Secured with 3 Layer Compression System - Right Lower Extremity Electronic Signature(s) Signed: 02/16/2018 4:03:23 PM By: Montey Hora Entered By: Montey Hora on 02/16/2018 08:28:42 Fernando, Jolene Schimke ([id number]) -------------------------------------------------------------------------------- [place] Details Patient Name: Romanoff, Kainoa T. Date of Service: 02/16/2018 8:00 AM Medical Record Number: [id number] Patient Account Number: [id number] Date of Birth/Sex: Jul 31, 1961 (57 y.o. M) Treating RN: Montey Hora Primary Care Kjell Brannen: [name] Other Clinician:  Referring Jimmey Hengel: Referral, Self Treating Stryker Veasey/Extender: Cathie Olden in Treatment: 0 Vital Signs Time Taken: 08:17 Temperature (F): 98.3 Height (in): 73 Pulse (bpm): 64 Source: Measured Respiratory Rate (breaths/min): 18 Weight (lbs): 320 Blood Pressure (mmHg): 148/81 Source: Measured Reference Range: 80 - 120 mg / dl Body Mass Index (BMI): 42.2 Electronic Signature(s) Signed: 02/16/2018 4:03:23 PM By: Montey Hora Entered By: Montey Hora on 02/16/2018 08:20:36

## 2018-02-17 NOTE — Progress Notes (Signed)
JIN, CAPOTE ([id number]) Visit Report for 02/16/2018 Abuse/Suicide Risk Screen Details Patient Name: Saintil, Kinser T. Date of Service: 02/16/2018 8:00 AM Medical Record Number: [id number] Patient Account Number: [id number] Date of Birth/Sex: 03-24-1961 (57 y.o. M) Treating RN: Montey Hora Primary Care Nyoka Alcoser: [name] Other Clinician: Referring Casmer Yepiz: Referral, Self Treating Makinzie Considine/Extender: Cathie Olden in Treatment: 0 Abuse/Suicide Risk Screen Items Answer ABUSE/SUICIDE RISK SCREEN: Has anyone close to you tried to hurt or harm you recentlyo No Do you feel uncomfortable with anyone in your familyo No Has anyone forced you do things that you didnot want to doo No Do you have any thoughts of harming yourselfo No Patient displays signs or symptoms of abuse and/or neglect. No Electronic Signature(s) Signed: 02/16/2018 4:03:23 PM By: Montey Hora Entered By: Montey Hora on 02/16/2018 08:13:38 Riggi, Jolene Schimke ([id number]) -------------------------------------------------------------------------------- Activities of Daily Living Details Patient Name: Blizzard, Jeovany T. Date of Service: 02/16/2018 8:00 AM Medical Record Number: [id number] Patient Account Number: [id number] Date of Birth/Sex: Jun 06, 1961 (57 y.o. M) Treating RN: Montey Hora Primary Care Lidwina Kaner: [name] Other Clinician: Referring Winni Ehrhard: Referral, Self Treating Delphine Sizemore/Extender: Cathie Olden in Treatment: 0 Activities of Daily Living Items Answer Activities of Daily Living (Please select one for each item) Drive Automobile Completely Able Take Medications Completely Able Use Telephone Completely Able Care for Appearance Completely Able Use Toilet Completely Able Bath / Shower Completely Able Dress Self Completely Able Feed Self Completely Able Walk Completely Able Get In / Out Bed Completely Able Housework Completely Able Prepare Meals Completely  [place] [employer] for Self Completely Able Electronic Signature(s) Signed: 02/16/2018 4:03:23 PM By: Montey Hora Entered By: Montey Hora on 02/16/2018 08:13:58 Mccart, Jolene Schimke ([id number]) -------------------------------------------------------------------------------- Education Assessment Details Patient Name: Rothman, Christion T. Date of Service: 02/16/2018 8:00 AM Medical Record Number: [id number] Patient Account Number: [id number] Date of Birth/Sex: 1961-07-25 (57 y.o. M) Treating RN: Montey Hora Primary Care Fidel Caggiano: [name] Other Clinician: Referring Ulyana Pitones: Referral, Self Treating Tejon Gracie/Extender: Cathie Olden in Treatment: 0 Primary Learner Assessed: Patient Learning Preferences/Education Level/Primary Language Learning Preference: Explanation, Demonstration Highest Education Level: College or Above Preferred Language: English Cognitive Barrier Assessment/Beliefs Language Barrier: No Translator Needed: No Memory Deficit: No Emotional Barrier: No Cultural/Religious Beliefs Affecting Medical Care: No Physical Barrier Assessment Impaired Vision: No Impaired Hearing: No Decreased Hand dexterity: No Knowledge/Comprehension Assessment Knowledge Level: Medium Comprehension Level: Medium Ability to understand written Medium instructions: Ability to understand verbal Medium instructions: Motivation Assessment Anxiety Level: Calm Cooperation: Cooperative Education Importance: Acknowledges Need Interest in Health Problems: Asks Questions Perception: Coherent Willingness to Engage in Self- Medium Management Activities: Readiness to Engage in Self- Medium Management Activities: Electronic Signature(s) Signed: 02/16/2018 4:03:23 PM By: Montey Hora Entered By: Montey Hora on 02/16/2018 08:14:21 Tavenner, Jolene Schimke ([id number]) -------------------------------------------------------------------------------- Fall Risk  Assessment Details Patient Name: Seal, Shayden T. Date of Service: 02/16/2018 8:00 AM Medical Record Number: [id number] Patient Account Number: [id number] Date of Birth/Sex: 1961/05/31 (57 y.o. M) Treating RN: Montey Hora Primary Care Benjamen Koelling: [name] Other Clinician: Referring Roux Brandy: Referral, Self Treating Keylah Darwish/Extender: Cathie Olden in Treatment: 0 Fall Risk Assessment Items Have you had 2 or more falls in the last 12 monthso 0 No Have you had any fall that resulted in injury in the last 12 monthso 0 No FALL RISK ASSESSMENT: History of falling - immediate or within 3 months 0 No Secondary diagnosis 0 No Ambulatory aid None/bed rest/wheelchair/nurse 0 Yes Crutches/cane/walker 0 No Furniture 0 No IV Access/Saline  Lock 0 No Gait/Training Normal/bed rest/immobile 0 Yes Weak 0 No Impaired 0 No Mental Status Oriented to own ability 0 Yes Electronic Signature(s) Signed: 02/16/2018 4:03:23 PM By: Montey Hora Entered By: Montey Hora on 02/16/2018 08:14:31 Thackston, Jolene Schimke ([id number]) -------------------------------------------------------------------------------- Foot Assessment Details Patient Name: Freel, Kc T. Date of Service: 02/16/2018 8:00 AM Medical Record Number: [id number] Patient Account Number: [id number] Date of Birth/Sex: 26-Aug-1961 (57 y.o. M) Treating RN: Montey Hora Primary Care Cleopha Indelicato: [name] Other Clinician: Referring Zayquan Bogard: Referral, Self Treating Marcelline Temkin/Extender: Cathie Olden in Treatment: 0 Foot Assessment Items Site Locations + = Sensation present, - = Sensation absent, C = Callus, U = Ulcer R = Redness, W = Warmth, M = Maceration, PU = Pre-ulcerative lesion F = Fissure, S = Swelling, D = Dryness Assessment Right: Left: Other Deformity: No No Prior Foot Ulcer: No No Prior Amputation: No No Charcot Joint: No No Ambulatory Status: Ambulatory Without Help Gait: Steady Electronic  Signature(s) Signed: 02/16/2018 4:03:23 PM By: Montey Hora Entered By: Montey Hora on 02/16/2018 08:36:41 Payne, Jolene Schimke ([id number]) -------------------------------------------------------------------------------- Nutrition Risk Assessment Details Patient Name: Luedke, Mykale T. Date of Service: 02/16/2018 8:00 AM Medical Record Number: [id number] Patient Account Number: [id number] Date of Birth/Sex: [birth year] (57 y.o. M) Treating RN: Montey Hora Primary Care Ingvald Theisen: [name] Other Clinician: Referring Londynn Sonoda: Referral, Self Treating Chaquana Nichols/Extender: Cathie Olden in Treatment: 0 Height (in): 73 Weight (lbs): 266 Body Mass Index (BMI): 35.1 Nutrition Risk Assessment Items NUTRITION RISK SCREEN: I have an illness or condition that made me change the kind and/or amount of 0 No food I eat I eat fewer than two meals per day 0 No I eat few fruits and vegetables, or milk products 0 No I have three or more drinks of beer, liquor or wine almost every day 0 No I have tooth or mouth problems that make it hard for me to eat 0 No I don't always have enough money to buy the food I need 0 No I eat alone most of the time 0 No I take three or more different prescribed or over-the-counter drugs a day 1 Yes Without wanting to, I have lost or gained 10 pounds in the last six months 0 No I am not always physically able to shop, cook and/or feed myself 0 No Nutrition Protocols Good Risk Protocol 0 No interventions needed Moderate Risk Protocol Electronic Signature(s) Signed: 02/16/2018 4:03:23 PM By: Montey Hora Entered By: Montey Hora on 02/16/2018 08:14:38

## 2018-02-18 NOTE — Progress Notes (Signed)
[name] ([id number]) Visit Report for 02/16/2018 Chief Complaint Document Details Patient Name: [name], [name] Date of Service: 02/16/2018 8:00 AM Medical Record Number: [id number] Patient Account Number: [id number] Date of Birth/Sex: 1961-08-17 (57 y.o. M) Treating RN: [name] Primary Care Provider: Einar [name] Other Clinician: Referring Provider: Referral, Self Treating Provider/Extender: [name] in Treatment: 0 Information Obtained from: Patient Chief Complaint He is here for evaluation of right lower extremity ulcer Electronic Signature(s) Signed: 02/16/2018 9:13:24 AM By: [name] Entered By: [name] on 02/16/2018 09:13:23 [name], [name] ([id number]) -------------------------------------------------------------------------------- HPI Details Patient Name: [name], [name] Date of Service: 02/16/2018 8:00 AM Medical Record Number: [id number] Patient Account Number: [id number] Date of Birth/Sex: 1961-10-10 (56 y.o. M) Treating RN: [name] Primary Care Provider: Einar [name] Other Clinician: Referring Provider: Referral, Self Treating Provider/Extender: [name] in Treatment: 0 History of Present Illness HPI Description: 02/16/18-He is here for initial evaluation of a right lower extremity ulcer. He was here in 2017, referred to vein and vascular and was initiated on compression therapy with no intervention. He states that over the last few months he has experienced some fluctuations in his weight which resulted in increased lower extremity edema. He developed a blister with ulceration to the right medical lower leg which has improved but not healed. He has made changes to his diet, is now on low dose diuretic. He is compliant in wearing compression stockings, he believes 20-85mmHg. I expect this to be healed at next weeks appointment; we will treat with hydrofera blue and 3 layer compression. He is a diet  controlled diabetic, last A1c 6.9 last month. Electronic Signature(s) Signed: 02/16/2018 10:14:56 AM By: [name] Previous Signature: 02/16/2018 9:21:59 AM Version By: [name] Entered By: [name] on 02/16/2018 10:14:56 [name], [name] ([id number]) -------------------------------------------------------------------------------- Physical Exam Details Patient Name: [name], [name] Date of Service: 02/16/2018 8:00 AM Medical Record Number: [id number] Patient Account Number: [id number] Date of Birth/Sex: 16-Jul-1961 (57 y.o. M) Treating RN: [name] Primary Care Provider: Einar [name] Other Clinician: Referring Provider: Referral, Self Treating Provider/Extender: [name] in Treatment: 0 Respiratory respirations are even and unlabored. clear throughout. Cardiovascular s1 s2 regular rate and rhythm. RLE- palpable DP, PT. BLE- no edema, warm to touch, hemosiderin staining, reticular veins. Musculoskeletal ambulates with no assistive devices. Psychiatric appears to have appropriate insight and judgement to medical care. oriented x4. calm, pleasant, conversive. Electronic Signature(s) Signed: 02/16/2018 9:24:00 AM By: [name] Entered By: [name] on 02/16/2018 09:24:00 [name], [name] ([id number]) -------------------------------------------------------------------------------- Physician Orders Details Patient Name: [name], [name] Date of Service: 02/16/2018 8:00 AM Medical Record Number: [id number] Patient Account Number: [id number] Date of Birth/Sex: 1961-02-19 (57 y.o. M) Treating RN: [name] Primary Care Provider: Einar [name] Other Clinician: Referring Provider: Referral, Self Treating Provider/Extender: [name] in Treatment: 0 Verbal / Phone Orders: Yes Clinician: Pinkerton, [name] Read Back and Verified: Yes Diagnosis Coding Wound Cleansing o Clean wound with Normal Saline. Anesthetic (add to  Medication List) o Topical Lidocaine 4% cream applied to wound bed prior to debridement (In Clinic Only). Skin Barriers/Peri-Wound Care o Moisturizing lotion Primary Wound Dressing o Hydrafera Blue Ready Transfer - RLE ulcer Secondary Dressing o ABD pad Dressing Change Frequency o Change dressing every week Follow-up Appointments o Return Appointment in 1 week. Edema Control o 3 Layer Compression System - Right Lower Extremity - unna to anchor Additional Orders / Instructions o Increase protein intake. Patient Medications Allergies: No Known Drug Allergies  Notifications Medication Indication Start End lidocaine DOSE 1 - topical 4 % cream - 1 cream topical Electronic Signature(s) Signed: 02/16/2018 9:02:48 PM By: [name] Entered By: [name] on 02/16/2018 09:25:22 Pimenta, OLON RUSS ([id number]) -------------------------------------------------------------------------------- Prescription 02/16/2018 Patient Name: [name] Provider: Lawanda Cousins NP Date of Birth: [birth year] NPI#: [id number] Sex: M DEA#: [id number] Phone #: [phone] License #: Patient Address: [facility] [place], [place] [address] [address], [place], [place] [address] [phone] Allergies No Known Drug Allergies Medication Medication: Route: Strength: Form: lidocaine 4 % topical cream topical 4% cream Class: TOPICAL LOCAL ANESTHETICS Dose: Frequency / Time: Indication: 1 1 cream topical Number of Refills: Number of Units: 0 Generic Substitution: Start Date: End Date: One Time Use: Substitution Permitted No Note to Pharmacy: Signature(s): Date(s): Electronic Signature(s) Signed: 02/16/2018 9:02:48 PM By: [name] Entered By: [name] on 02/16/2018 09:25:22 [name], [name]  ([id number]) --------------------------------------------------------------------------------  Problem List Details Patient Name: [name], [name] Date of Service: 02/16/2018 8:00 AM Medical Record Number: [id number] Patient Account Number: [id number] Date of Birth/Sex: 13-Dec-1960 (57 y.o. M) Treating RN: [name] Primary Care Provider: Einar [name] Other Clinician: Referring Provider: Referral, Self Treating Provider/Extender: [name] in Treatment: 0 Active Problems ICD-10 Impacting Encounter Code Description Active Date Wound Healing Diagnosis R60.9 Edema, unspecified 02/16/2018 Yes I87.331 Chronic venous hypertension (idiopathic) with ulcer and 02/16/2018 Yes inflammation of right lower extremity Inactive Problems Resolved Problems Electronic Signature(s) Signed: 02/16/2018 9:08:24 AM By: [name] Entered By: [name] on 02/16/2018 09:08:24 Degollado, [name] ([id number]) -------------------------------------------------------------------------------- Progress Note Details Patient Name: [name], [name] Date of Service: 02/16/2018 8:00 AM Medical Record Number: [id number] Patient Account Number: [id number] Date of Birth/Sex: [birth year] (57 y.o. M) Treating RN: [name] Primary Care Provider: Einar [name] Other Clinician: Referring Provider: Referral, Self Treating Provider/Extender: [name] in Treatment: 0 Subjective Chief Complaint Information obtained from Patient He is here for evaluation of right lower extremity ulcer History of Present Illness (HPI) 02/16/18-He is here for initial evaluation of a right lower extremity ulcer. He was here in 2017, referred to vein and vascular and was initiated on compression therapy with no intervention. He states that over the last few months he has experienced some fluctuations in his weight which resulted in increased lower extremity edema. He developed a blister with ulceration to  the right medical lower leg which has improved but not healed. He has made changes to his diet, is now on low dose diuretic. He is compliant in wearing compression stockings, he believes 20-75mmHg. I expect this to be healed at next weeks appointment; we will treat with hydrofera blue and 3 layer compression. He is a diet controlled diabetic, last A1c 6.9 last month. Wound History Patient presents with 1 open wound that has been present for approximately 3 weeks. Patient has been treating wound in the following manner: compression stockings only. Laboratory tests have not been performed in the last month. Patient reportedly has not tested positive for an antibiotic resistant organism. Patient reportedly has not tested positive for osteomyelitis. Patient reportedly has had testing performed to evaluate circulation in the legs. Patient History Information obtained from Patient. Allergies No Known Drug Allergies Family History Cancer - Mother,Paternal Grandparents, Diabetes - Mother,Siblings,Paternal Grandparents,Father, Heart Disease - Paternal Grandparents,Father,Maternal Grandparents, Hypertension - Mother,Father,Siblings,Paternal Grandparents,Maternal Grandparents,Maternal Grandparents, Stroke - Paternal Grandparents,Maternal Grandparents, Thyroid Problems - Paternal Grandparents. Social History Never smoker, Marital Status - Single,  Alcohol Use - Rarely, Drug Use - No History, Caffeine Use - Daily. Medical History Hematologic/Lymphatic Patient has history of Lymphedema Cardiovascular Patient has history of Peripheral Venous Disease Medical And Surgical History Notes Constitutional Symptoms (General Health) HTN; Diabetes controlled with diet; lower Back pain [name], [name] ([id number]) Review of Systems (ROS) Eyes The patient has no complaints or symptoms. Ear/Nose/Mouth/Throat The patient has no complaints or symptoms. Hematologic/Lymphatic The patient has no complaints or  symptoms. Respiratory The patient has no complaints or symptoms. Cardiovascular Complains or has symptoms of LE edema. Gastrointestinal The patient has no complaints or symptoms. Endocrine The patient has no complaints or symptoms. Genitourinary The patient has no complaints or symptoms. Immunological The patient has no complaints or symptoms. Integumentary (Skin) The patient has no complaints or symptoms. Musculoskeletal The patient has no complaints or symptoms. Neurologic The patient has no complaints or symptoms. Oncologic The patient has no complaints or symptoms. Psychiatric The patient has no complaints or symptoms. Objective Constitutional Vitals Time Taken: 8:17 AM, Height: 73 in, Source: Measured, Weight: 320 lbs, Source: Measured, BMI: 42.2, Temperature: 98.3 F, Pulse: 64 bpm, Respiratory Rate: 18 breaths/min, Blood Pressure: 148/81 mmHg. Respiratory respirations are even and unlabored. clear throughout. Cardiovascular s1 s2 regular rate and rhythm. RLE- palpable DP, PT. BLE- no edema, warm to touch, hemosiderin staining, reticular veins. Musculoskeletal ambulates with no assistive devices. Psychiatric appears to have appropriate insight and judgement to medical care. oriented x4. calm, pleasant, conversive. [name], [name] ([id number]) Integumentary (Hair, Skin) Wound #2 status is Open. Original cause of wound was Gradually Appeared. The wound is located on the Right,Medial Lower Leg. The wound measures 1.4cm length x 3.2cm width x 0.1cm depth; 3.519cm^2 area and 0.352cm^3 volume. There is no tunneling or undermining noted. There is a medium amount of serous drainage noted. The wound margin is flat and intact. There is medium (34-66%) pink granulation within the wound bed. There is a medium (34-66%) amount of necrotic tissue within the wound bed including Adherent Slough. The periwound skin appearance exhibited: Hemosiderin Staining. The periwound skin  appearance did not exhibit: Callus, Crepitus, Excoriation, Induration, Rash, Scarring, Dry/Scaly, Maceration, Atrophie Blanche, Cyanosis, Ecchymosis, Mottled, Pallor, Rubor, Erythema. Periwound temperature was noted as No Abnormality. Assessment Active Problems ICD-10 R60.9 - Edema, unspecified I87.331 - Chronic venous hypertension (idiopathic) with ulcer and inflammation of right lower extremity Plan Wound Cleansing: Clean wound with Normal Saline. Anesthetic (add to Medication List): Topical Lidocaine 4% cream applied to wound bed prior to debridement (In Clinic Only). Skin Barriers/Peri-Wound Care: Moisturizing lotion Primary Wound Dressing: Hydrafera Blue Ready Transfer - RLE ulcer Secondary Dressing: ABD pad Dressing Change Frequency: Change dressing every week Follow-up Appointments: Return Appointment in 1 week. Edema Control: 3 Layer Compression System - Right Lower Extremity - unna to anchor Additional Orders / Instructions: Increase protein intake. The following medication(s) was prescribed: lidocaine topical 4 % cream 1 1 cream topical was prescribed at facility Electronic Signature(s) Signed: 02/16/2018 10:17:05 AM By: [name] Previous Signature: 02/16/2018 9:25:28 AM Version By: [name] Entered By: [name] on 02/16/2018 10:17:05 [name], [name] ([id number]) [name], [name] ([id number]) -------------------------------------------------------------------------------- ROS/PFSH Details Patient Name: Sferrazza, Domnic T. Date of Service: 02/16/2018 8:00 AM Medical Record Number: [id number] Patient Account Number: [id number] Date of Birth/Sex: 03-22-1961 (57 y.o. M) Treating RN: Montey Hora Primary Care Provider: Einar [name] Other Clinician: Referring Provider: Referral, Self Treating Provider/Extender: [name] in Treatment: 0 Information Obtained From Patient Wound History Do you currently have one  or more open woundso Yes How  many open wounds do you currently haveo 1 Approximately how long have you had your woundso 3 weeks How have you been treating your wound(s) until nowo compression stockings only Has your wound(s) ever healed and then re-openedo No Have you had any lab work done in the past montho No Have you tested positive for an antibiotic resistant organism (MRSA, VRE)o No Have you tested positive for osteomyelitis (bone infection)o No Have you had any tests for circulation on your legso Yes Who ordered the testo [facility] Where was the test doneo [facility] Cardiovascular Complaints and Symptoms: Positive for: LE edema Medical History: Positive for: Hypertension; Peripheral Venous Disease Negative for: Angina; Arrhythmia; Congestive Heart Failure; Coronary Artery Disease; Deep Vein Thrombosis; Hypotension; Myocardial Infarction; Peripheral Arterial Disease; Phlebitis; Vasculitis Constitutional Symptoms (General Health) Medical History: Past Medical History Notes: HTN; Diabetes controlled with diet; lower Back pain Eyes Complaints and Symptoms: No Complaints or Symptoms Medical History: Negative for: Cataracts; Glaucoma; Optic Neuritis Ear/Nose/Mouth/Throat Complaints and Symptoms: No Complaints or Symptoms Medical History: Negative for: Chronic sinus problems/congestion; Middle ear problems Hematologic/Lymphatic [name], [name] ([id number]) Complaints and Symptoms: No Complaints or Symptoms Medical History: Positive for: Lymphedema Negative for: Anemia; Hemophilia; Human Immunodeficiency Virus; Sickle Cell Disease Respiratory Complaints and Symptoms: No Complaints or Symptoms Medical History: Negative for: Aspiration; Asthma; Chronic Obstructive Pulmonary Disease (COPD); Pneumothorax; Sleep Apnea; Tuberculosis Gastrointestinal Complaints and Symptoms: No Complaints or Symptoms Medical History: Negative for: Cirrhosis ; Colitis; Crohnos; Hepatitis A; Hepatitis B; Hepatitis  C Endocrine Complaints and Symptoms: No Complaints or Symptoms Medical History: Positive for: Type II Diabetes Negative for: Type I Diabetes Time with diabetes: 2009 Treated with: Diet Genitourinary Complaints and Symptoms: No Complaints or Symptoms Medical History: Negative for: End Stage Renal Disease Immunological Complaints and Symptoms: No Complaints or Symptoms Medical History: Negative for: Lupus Erythematosus; Raynaudos; Scleroderma Integumentary (Skin) Complaints and Symptoms: No Complaints or Symptoms Medical History: Negative for: History of Burn [name], Wyatt T. ([id number]) Musculoskeletal Complaints and Symptoms: No Complaints or Symptoms Medical History: Negative for: Gout; Rheumatoid Arthritis; Osteoarthritis; Osteomyelitis Neurologic Complaints and Symptoms: No Complaints or Symptoms Medical History: Positive for: Neuropathy Negative for: Dementia; Quadriplegia; Paraplegia; Seizure Disorder Oncologic Complaints and Symptoms: No Complaints or Symptoms Medical History: Negative for: Received Chemotherapy; Received Radiation Psychiatric Complaints and Symptoms: No Complaints or Symptoms Medical History: Negative for: Anorexia/bulimia; Confinement Anxiety Immunizations Pneumococcal Vaccine: Received Pneumococcal Vaccination: No Implantable Devices Family and Social History Cancer: Yes - Mother,Paternal Grandparents; Diabetes: Yes - Mother,Siblings,Paternal Grandparents,Father; Heart Disease: Yes - Paternal Grandparents,Father,Maternal Grandparents; Hypertension: Yes - Mother,Father,Siblings,Paternal Grandparents,Maternal Grandparents,Maternal Grandparents; Stroke: Yes - Paternal Grandparents,Maternal Grandparents; Thyroid Problems: Yes - Paternal Grandparents; Never smoker; Marital Status - Single; Alcohol Use: Rarely; Drug Use: No History; Caffeine Use: Daily; Advanced Directives: No; Patient does not want information on Advanced Directives;  Living Will: No; Medical Power of Attorney: No Electronic Signature(s) Signed: 02/16/2018 4:03:23 PM By: Montey Hora Signed: 02/16/2018 9:02:48 PM By: [name] Entered By: Montey Hora on 02/16/2018 08:17:04 Petruzzi, [name] ([id number]) -------------------------------------------------------------------------------- [place] Details Patient Name: Nation, Mickal T. Date of Service: 02/16/2018 Medical Record Number: [id number] Patient Account Number: [id number] Date of Birth/Sex: 25-Feb-1961 (58 y.o. M) Treating RN: [name] Primary Care Provider: Einar [name] Other Clinician: Referring Provider: Referral, Self Treating Provider/Extender: [name] in Treatment: 0 Diagnosis Coding ICD-10 Codes Code Description R60.9 Edema, unspecified I87.331 Chronic venous hypertension (idiopathic) with ulcer and inflammation of right lower extremity Facility Procedures CPT4 Code: [id number]  Description: 99214 - WOUND CARE VISIT-LEV 4 EST PT Modifier: Quantity: 1 Physician Procedures CPT4 Code: 4720721 Description: 82883 - WC PHYS LEVEL 3 - EST PT ICD-10 Diagnosis Description R60.9 Edema, unspecified Modifier: Quantity: 1 Electronic Signature(s) Signed: 02/16/2018 9:57:07 AM By: Alric Quan Signed: 02/16/2018 9:02:48 PM By: [name] Previous Signature: 02/16/2018 9:25:45 AM Version By: [name] Entered By: Alric Quan on 02/16/2018 09:57:06

## 2018-02-20 ENCOUNTER — Ambulatory Visit: Payer: Managed Care, Other (non HMO) | Admitting: Physician Assistant

## 2018-02-21 ENCOUNTER — Encounter: Payer: Self-pay | Admitting: Dietician

## 2018-02-21 ENCOUNTER — Encounter: Payer: Managed Care, Other (non HMO) | Attending: Internal Medicine | Admitting: Dietician

## 2018-02-21 VITALS — BP 160/86 | Ht 73.0 in | Wt 325.3 lb

## 2018-02-21 DIAGNOSIS — E1151 Type 2 diabetes mellitus with diabetic peripheral angiopathy without gangrene: Secondary | ICD-10-CM | POA: Diagnosis not present

## 2018-02-21 DIAGNOSIS — E11621 Type 2 diabetes mellitus with foot ulcer: Secondary | ICD-10-CM | POA: Diagnosis present

## 2018-02-21 DIAGNOSIS — L97919 Non-pressure chronic ulcer of unspecified part of right lower leg with unspecified severity: Secondary | ICD-10-CM | POA: Diagnosis not present

## 2018-02-21 DIAGNOSIS — Z823 Family history of stroke: Secondary | ICD-10-CM | POA: Diagnosis not present

## 2018-02-21 DIAGNOSIS — E1140 Type 2 diabetes mellitus with diabetic neuropathy, unspecified: Secondary | ICD-10-CM | POA: Insufficient documentation

## 2018-02-21 DIAGNOSIS — Z8249 Family history of ischemic heart disease and other diseases of the circulatory system: Secondary | ICD-10-CM | POA: Diagnosis not present

## 2018-02-21 DIAGNOSIS — E119 Type 2 diabetes mellitus without complications: Secondary | ICD-10-CM

## 2018-02-21 DIAGNOSIS — I10 Essential (primary) hypertension: Secondary | ICD-10-CM | POA: Insufficient documentation

## 2018-02-21 NOTE — Patient Instructions (Addendum)
  Check blood sugars 2 x day before breakfast and 2 hrs after supper every day and record   Bring blood sugar records to the next appointment/class  Call your doctor for a prescription for:  1. Meter strips (type) checking 2  times per day  2. Lancets (type) checking  2   times per day  Exercise: Continue walking for  20  minutes   5 days a week  Eat 3 meals day and 1  snack a day=at bedtime if eating early supper; if eating late supper, eat afternoon snack  Avoid fruit juices and sweetened beverages  Limit intake of sweets and fried foods  Space meals 4-6 hours apart  Eat 3 carbohydrate servings/meal + protein  Eat 1 carbohydrate serving/snack + protein  Get a Sharps container  Complete A 3 day food record and bring to next appointment  Return for appointment/classes on:  03-15-18

## 2018-02-21 NOTE — Progress Notes (Addendum)
Diabetes Self-Management Education  Visit Type: First/Initial  Appt. Start Time: 1015 Appt. End Time: 1115  02/21/2018  Mr. [name], identified by name and date of birth, is a 57 y.o. male with a diagnosis of Diabetes: Type 2.   ASSESSMENT  Blood pressure (!) 160/86, height 6\' 1"  (1.854 m), weight (!) 325 lb 4.8 oz (147.6 kg). Body mass index is 42.92 kg/m.  Diabetes Self-Management Education - 02/21/18 1044      Visit Information   Visit Type  First/Initial      Initial Visit   Diabetes Type  Type 2      Health Coping   How would you rate your overall health?  Good      Psychosocial Assessment   Patient Belief/Attitude about Diabetes  Motivated to manage diabetes    Self-care barriers  None    Self-management support  Doctor's office;Friends    Other persons present  Patient    Patient Concerns  Weight Control become more fit    Special Needs  None    Preferred Learning Style  Hands on    Learning Readiness  Ready    What is the last grade level you completed in school?  college degree      Pre-Education Assessment   Patient understands the diabetes disease and treatment process.  Needs Review    Patient understands incorporating nutritional management into lifestyle.  Needs Review    Patient undertands incorporating physical activity into lifestyle.  Needs Review    Patient understands using medications safely.  Needs Instruction    Patient understands monitoring blood glucose, interpreting and using results  Needs Instruction    Patient understands prevention, detection, and treatment of acute complications.  Needs Review    Patient understands prevention, detection, and treatment of chronic complications.  Needs Review    Patient understands how to develop strategies to address psychosocial issues.  Needs Review    Patient understands how to develop strategies to promote health/change behavior.  Needs Review      Complications   Last HgB A1C per  patient/outside source  6.9 %    How often do you check your blood sugar?  0 times/day (not testing)    Have you had a dilated eye exam in the past 12 months?  Yes 09-2017    Have you had a dental exam in the past 12 months?  Yes 01-2018    Are you checking your feet?  Yes    How many days per week are you checking your feet?  7      Dietary Intake   Breakfast  eats breakfast at 0:86-5H=8 slices [place] bacon and 2 eggs    Snack (morning)  eats hummus and celery or cheese stick    Lunch  eats lunch at 1:30-2p=usually eats meat and low carb vegetables (left overs); rarely eats fruit    Snack (afternoon)  none    Dinner  eats supper 6:30-9p; eats out 4x/wk=meat and vegetables (meat, potato or pasta and salad; pizza; mexican chips and salsa with tortilla wrap)    Snack (evening)  eats hummus and cheese    Beverage(s)  drinks water 8+x/day and diet sodas/unsweet tea 8+x/day; drinks occasional milk      Exercise   Exercise Type  Light (walking / raking leaves)    How many days per week to you exercise?  5    How many minutes per day do you exercise?  15    Total minutes  per week of exercise  75      Patient Education   Previous Diabetes Education  Yes (please comment) only with doctor at office visits    Disease state   Definition of diabetes, type 1 and 2, and the diagnosis of diabetes;Factors that contribute to the development of diabetes    Nutrition management   Role of diet in the treatment of diabetes and the relationship between the three main macronutrients and blood glucose level;Food label reading, portion sizes and measuring food.;Carbohydrate counting    Physical activity and exercise   Role of exercise on diabetes management, blood pressure control and cardiac health.;Helped patient identify appropriate exercises in relation to his/her diabetes, diabetes complications and other health issue.    Monitoring  Taught/evaluated SMBG meter.;Purpose and frequency of SMBG.;Yearly dilated eye  exam;Taught/discussed recording of test results and interpretation of SMBG.;Identified appropriate SMBG and/or A1C goals.;Daily foot exams    Chronic complications  Relationship between chronic complications and blood glucose control;Assessed and discussed foot care and prevention of foot problems;Retinopathy and reason for yearly dilated eye exams;Reviewed with patient heart disease, higher risk of, and prevention;Lipid levels, blood glucose control and heart disease;Dental care;Nephropathy, what it is, prevention of, the use of ACE, ARB's and early detection of through urine microalbumia.    Personal strategies to promote health  Lifestyle issues that need to be addressed for better diabetes care;Helped patient develop diabetes management plan for (enter comment)      Outcomes   Expected Outcomes  Demonstrated interest in learning. Expect positive outcomes       Individualized Plan for Diabetes Self-Management Training:   Learning Objective:  Patient will have a greater understanding of diabetes self-management. Patient education plan is to attend individual and/or group sessions per assessed needs and concerns.   Plan:   Patient Instructions   Check blood sugars 2 x day before breakfast and 2 hrs after supper every day and record   Bring blood sugar records to the next appointment/class  Call your doctor for a prescription for:  1. Meter strips (type) checking 2  times per day  2. Lancets (type) checking  2   times per day  Exercise: Continue walking for  20  minutes   5 days a week  Eat 3 meals day and 1  snack a day=at bedtime if eating early supper; if eating late supper, eat afternoon snack  Avoid fruit juices and sweetened beverages  Limit intake of sweets and fried foods  Space meals 4-6 hours apart  Eat 3 carbohydrate servings/meal + protein  Eat 1 carbohydrate serving/snack + protein  Get a Sharps container  Complete a 3 day food record and bring to next  appointment  Return for appointment/classes on:  03-15-18   Expected Outcomes:  Demonstrated interest in learning. Expect positive outcomes  Education material provided: General meal planning guidelines, Food Group handout, One Touch Verio meter  If problems or questions, patient to contact team via: [phone]  Future DSME appointment:  03-15-18 with dietitian

## 2018-02-23 ENCOUNTER — Encounter: Payer: Managed Care, Other (non HMO) | Admitting: Nurse Practitioner

## 2018-02-23 DIAGNOSIS — E11621 Type 2 diabetes mellitus with foot ulcer: Secondary | ICD-10-CM | POA: Diagnosis not present

## 2018-02-24 NOTE — Progress Notes (Signed)
ARMISTEAD, SULT ([id number]) Visit Report for 02/23/2018 Arrival Information Details Patient Name: [name], [name] Date of Service: 02/23/2018 8:30 AM Medical Record Number: [id number] Patient Account Number: [id number] Date of Birth/Sex: February 15, 1961 (57 y.o. M) Treating RN: Montey Hora Primary Care Dore Oquin: [name] Other Clinician: Referring Tharun Cappella: [name] Treating Amiayah Giebel/Extender: Cathie Olden in Treatment: 1 Visit Information History Since Last Visit Added or deleted any medications: No Patient Arrived: Ambulatory Any new allergies or adverse reactions: No Arrival Time: 08:43 Had a fall or experienced change in No Accompanied By: self activities of daily living that may affect Transfer Assistance: None risk of falls: Patient Identification Verified: Yes Signs or symptoms of abuse/neglect since last visito No Secondary Verification Process Completed: Yes Hospitalized since last visit: No Patient Has Alerts: Yes Implantable device outside of the clinic excluding No Patient Alerts: DMII cellular tissue based products placed in the center since last visit: Has Dressing in Place as Prescribed: Yes Has Compression in Place as Prescribed: Yes Pain Present Now: No Electronic Signature(s) Signed: 02/23/2018 4:24:22 PM By: Montey Hora Entered By: Montey Hora on 02/23/2018 08:43:18 Winger, Jolene Schimke ([id number]) -------------------------------------------------------------------------------- Encounter Discharge Information Details Patient Name: [name], [name] Date of Service: 02/23/2018 8:30 AM Medical Record Number: [id number] Patient Account Number: [id number] Date of Birth/Sex: 04-22-1961 (57 y.o. M) Treating RN: Roger Shelter Primary Care Lada Fulbright: [name] Other Clinician: Referring Cailynn Bodnar: [name] Treating Dhilan Brauer/Extender: Cathie Olden in Treatment: 1 Encounter Discharge Information Items Discharge Pain  Level: 0 Discharge Condition: Stable Ambulatory Status: Ambulatory Discharge Destination: Home Private Transportation: Auto Schedule Follow-up Appointment: Yes Medication Reconciliation completed and provided No to Patient/Care Khrystal Jeanmarie: Clinical Summary of Care: Electronic Signature(s) Signed: 02/23/2018 2:53:14 PM By: Roger Shelter Entered By: Roger Shelter on 02/23/2018 09:17:47 Saulters, Jolene Schimke ([id number]) -------------------------------------------------------------------------------- Lower Extremity Assessment Details Patient Name: [name], [name] Date of Service: 02/23/2018 8:30 AM Medical Record Number: [id number] Patient Account Number: [id number] Date of Birth/Sex: 04/15/1961 (57 y.o. M) Treating RN: Montey Hora Primary Care Kurk Corniel: [name] Other Clinician: Referring Blue Ruggerio: [name] Treating Jerilee Space/Extender: Cathie Olden in Treatment: 1 Edema Assessment Assessed: [Left: No] [Right: No] [Left: Edema] [Right: :] Calf Left: Right: Point of Measurement: 37 cm From Medial Instep 44.1 cm cm Ankle Left: Right: Point of Measurement: 13 cm From Medial Instep 28.3 cm cm Vascular Assessment Pulses: Dorsalis Pedis Palpable: [Left:Yes] Posterior Tibial Extremity colors, hair growth, and conditions: Extremity Color: [Left:Hyperpigmented] Hair Growth on Extremity: [Left:Yes] Temperature of Extremity: [Left:Warm] Capillary Refill: [Left:< 3 seconds] Electronic Signature(s) Signed: 02/23/2018 4:24:22 PM By: Montey Hora Entered By: Montey Hora on 02/23/2018 08:51:49 Mellone, Jolene Schimke ([id number]) -------------------------------------------------------------------------------- Multi Wound Chart Details Patient Name: [name], [name] Date of Service: 02/23/2018 8:30 AM Medical Record Number: [id number] Patient Account Number: [id number] Date of Birth/Sex: [birth year] (58 y.o. M) Treating RN: Ahmed Prima Primary Care Noraa Pickeral:  [name] Other Clinician: Referring Sargun Rummell: [name] Treating Aurie Harroun/Extender: Cathie Olden in Treatment: 1 Vital Signs Height(in): 73 Pulse(bpm): 76 Weight(lbs): 320 Blood Pressure(mmHg): 154/81 Body Mass Index(BMI): 42 Temperature(F): 98.4 Respiratory Rate 16 (breaths/min): Photos: [2:No Photos] [N/A:N/A] Wound Location: [2:Right Lower Leg - Medial] [N/A:N/A] Wounding Event: [2:Gradually Appeared] [N/A:N/A] Primary Etiology: [2:Venous Leg Ulcer] [N/A:N/A] Comorbid History: [2:Lymphedema, Hypertension, Peripheral Venous Disease, Type II Diabetes, Neuropathy] [N/A:N/A] Date Acquired: [2:01/16/2018] [N/A:N/A] Weeks of Treatment: [2:1] [N/A:N/A] Wound Status: [2:Open] [N/A:N/A] Measurements L x W x D [2:1.6x1.5x0.1] [N/A:N/A] (cm) Area (cm) : [2:1.885] [N/A:N/A] Volume (cm) : [2:0.188] [N/A:N/A] % Reduction in Area: [  2:46.40%] [N/A:N/A] % Reduction in Volume: [2:46.60%] [N/A:N/A] Classification: [2:Partial Thickness] [N/A:N/A] Exudate Amount: [2:Medium] [N/A:N/A] Exudate Type: [2:Serous] [N/A:N/A] Exudate Color: [2:amber] [N/A:N/A] Wound Margin: [2:Flat and Intact] [N/A:N/A] Granulation Amount: [2:Large (67-100%)] [N/A:N/A] Granulation Quality: [2:Pink] [N/A:N/A] Necrotic Amount: [2:Small (1-33%)] [N/A:N/A] Exposed Structures: [2:Fascia: No Fat Layer (Subcutaneous Tissue) Exposed: No Tendon: No Muscle: No Joint: No Bone: No] [N/A:N/A] Epithelialization: [2:Medium (34-66%)] [N/A:N/A] Periwound Skin Texture: [2:Excoriation: No Induration: No Callus: No Crepitus: No] [N/A:N/A] Rash: No Scarring: No Periwound Skin Moisture: Maceration: No N/A N/A Dry/Scaly: No Periwound Skin Color: Hemosiderin Staining: Yes N/A N/A Atrophie Blanche: No Cyanosis: No Ecchymosis: No Erythema: No Mottled: No Pallor: No Rubor: No Temperature: No Abnormality N/A N/A Tenderness on Palpation: No N/A N/A Wound Preparation: Ulcer Cleansing: N/A  N/A Rinsed/Irrigated with Saline, Other: soap and water Topical Anesthetic Applied: Other: lidocaine 4% Treatment Notes Electronic Signature(s) Signed: 02/23/2018 9:13:13 AM By: Lawanda Cousins Entered By: Lawanda Cousins on 02/23/2018 09:13:13 Bruun, Jolene Schimke ([id number]) -------------------------------------------------------------------------------- [place] Details Patient Name: [name], [name] Date of Service: 02/23/2018 8:30 AM Medical Record Number: [id number] Patient Account Number: [id number] Date of Birth/Sex: 1961/02/23 (57 y.o. M) Treating RN: Ahmed Prima Primary Care Euna Armon: [name] Other Clinician: Referring Aquan Kope: [name] Treating Jasiel Apachito/Extender: Cathie Olden in Treatment: 1 Active Inactive ` Orientation to the Wound Care Program Nursing Diagnoses: Knowledge deficit related to the wound healing center program Goals: Patient/caregiver will verbalize understanding of the [facility] Date Initiated: 02/16/2018 Target Resolution Date: 02/25/2018 Goal Status: Active Interventions: Provide education on orientation to the wound center Notes: ` Pain, Acute or Chronic Nursing Diagnoses: Pain, acute or chronic: actual or potential Potential alteration in comfort, pain Goals: Patient/caregiver will verbalize adequate pain control between visits Date Initiated: 02/16/2018 Target Resolution Date: 05/27/2018 Goal Status: Active Interventions: Complete pain assessment as per visit requirements Notes: ` Wound/Skin Impairment Nursing Diagnoses: Impaired tissue integrity Knowledge deficit related to ulceration/compromised skin integrity Goals: Ulcer/skin breakdown will have a volume reduction of 80% by week 12 Date Initiated: 02/16/2018 Target Resolution Date: 05/20/2018 Goal Status: Active Henslee, Kendryck T. ([id number]) Interventions: Assess patient/caregiver ability to perform ulcer/skin care regimen  upon admission and as needed Assess ulceration(s) every visit Notes: Electronic Signature(s) Signed: 02/23/2018 4:32:30 PM By: Alric Quan Entered By: Alric Quan on 02/23/2018 [place], Jolene Schimke ([id number]) -------------------------------------------------------------------------------- Pain Assessment Details Patient Name: [name], [name] Date of Service: 02/23/2018 8:30 AM Medical Record Number: [id number] Patient Account Number: [id number] Date of Birth/Sex: 12-25-1960 (57 y.o. M) Treating RN: Montey Hora Primary Care Joakim Huesman: [name] Other Clinician: Referring Ranesha Val: [name] Treating Corry Storie/Extender: Cathie Olden in Treatment: 1 Active Problems Location of Pain Severity and Description of Pain Patient Has Paino No Site Locations Pain Management and Medication Current Pain Management: Notes Topical or injectable lidocaine is offered to patient for acute pain when surgical debridement is performed. If needed, Patient is instructed to use over the counter pain medication for the following 24-48 hours after debridement. Wound care MDs do not prescribed pain medications. Patient has chronic pain or uncontrolled pain. Patient has been instructed to make an appointment with their Primary Care Physician for pain management. Electronic Signature(s) Signed: 02/23/2018 4:24:22 PM By: Montey Hora Entered By: Montey Hora on 02/23/2018 08:43:25 [place]otte, Jolene Schimke ([id number]) -------------------------------------------------------------------------------- Patient/Caregiver Education Details Patient Name: [name], [name] Date of Service: 02/23/2018 8:30 AM Medical Record Number: [id number] Patient Account Number: [id number] Date of Birth/Gender: May 14, 1961 (57 y.o. M) Treating RN: Roger Shelter Primary  Care Physician: [name] Other Clinician: Referring Physician: [name] Treating Physician/Extender: Cathie Olden in Treatment: 1 Education Assessment Education Provided To: Patient Education Topics Provided Wound/Skin Impairment: Handouts: Caring for Your Ulcer Methods: Explain/Verbal Responses: State content correctly Electronic Signature(s) Signed: 02/23/2018 2:53:14 PM By: Roger Shelter Entered By: Roger Shelter on 02/23/2018 09:17:58 Pouncey, Jolene Schimke ([id number]) -------------------------------------------------------------------------------- Wound Assessment Details Patient Name: [name], [name] Date of Service: 02/23/2018 8:30 AM Medical Record Number: [id number] Patient Account Number: [id number] Date of Birth/Sex: 25-Apr-1961 (57 y.o. M) Treating RN: Montey Hora Primary Care Romina Divirgilio: [name] Other Clinician: Referring Shelbie Franken: [name] Treating Lashell Moffitt/Extender: Cathie Olden in Treatment: 1 Wound Status Wound Number: 2 Primary Venous Leg Ulcer Etiology: Wound Location: Right Lower Leg - Medial Wound Open Wounding Event: Gradually Appeared Status: Date Acquired: 01/16/2018 Comorbid Lymphedema, Hypertension, Peripheral Weeks Of Treatment: 1 History: Venous Disease, Type II Diabetes, Neuropathy Clustered Wound: No Photos Photo Uploaded By: Montey Hora on 02/23/2018 10:03:36 Wound Measurements Length: (cm) 1.6 Width: (cm) 1.5 Depth: (cm) 0.1 Area: (cm) 1.885 Volume: (cm) 0.188 % Reduction in Area: 46.4% % Reduction in Volume: 46.6% Epithelialization: Medium (34-66%) Tunneling: No Undermining: No Wound Description Classification: Partial Thickness Wound Margin: Flat and Intact Exudate Amount: Medium Exudate Type: Serous Exudate Color: amber Foul Odor After Cleansing: No Slough/Fibrino Yes Wound Bed Granulation Amount: Large (67-100%) Exposed Structure Granulation Quality: Pink Fascia Exposed: No Necrotic Amount: Small (1-33%) Fat Layer (Subcutaneous Tissue) Exposed: No Necrotic Quality: Adherent Slough Tendon  Exposed: No Muscle Exposed: No Joint Exposed: No Bone Exposed: No Periwound Skin Texture [name], [name] ([id number]) Texture Color No Abnormalities Noted: No No Abnormalities Noted: No Callus: No Atrophie Blanche: No Crepitus: No Cyanosis: No Excoriation: No Ecchymosis: No Induration: No Erythema: No Rash: No Hemosiderin Staining: Yes Scarring: No Mottled: No Pallor: No Moisture Rubor: No No Abnormalities Noted: No Dry / Scaly: No Temperature / Pain Maceration: No Temperature: No Abnormality Wound Preparation Ulcer Cleansing: Rinsed/Irrigated with Saline, Other: soap and water, Topical Anesthetic Applied: Other: lidocaine 4%, Treatment Notes Wound #2 (Right, Medial Lower Leg) 1. Cleansed with: Clean wound with Normal Saline 2. Anesthetic Topical Lidocaine 4% cream to wound bed prior to debridement 3. Peri-wound Care: Moisturizing lotion 4. Dressing Applied: Calcium Alginate with Silver Mepitel 5. Secondary Dressing Applied ABD Pad 7. Secured with 3 Layer Compression System - Right Lower Extremity Electronic Signature(s) Signed: 02/23/2018 4:24:22 PM By: Montey Hora Entered By: Montey Hora on 02/23/2018 08:54:04 Sun, Jolene Schimke ([id number]) -------------------------------------------------------------------------------- [place] Details Patient Name: [name], [name] Date of Service: 02/23/2018 8:30 AM Medical Record Number: [id number] Patient Account Number: [id number] Date of Birth/Sex: [birth year] (57 y.o. M) Treating RN: Montey Hora Primary Care Briceyda Abdullah: [name] Other Clinician: Referring Deray Dawes: [name] Treating Armstrong Creasy/Extender: Cathie Olden in Treatment: 1 Vital Signs Time Taken: 08:43 Temperature (F): 98.4 Height (in): 73 Pulse (bpm): 79 Weight (lbs): 320 Respiratory Rate (breaths/min): 16 Body Mass Index (BMI): 42.2 Blood Pressure (mmHg): 154/81 Reference Range: 80 - 120 mg / dl Electronic  Signature(s) Signed: 02/23/2018 4:24:22 PM By: Montey Hora Entered By: Montey Hora on 02/23/2018 08:45:36

## 2018-02-24 NOTE — Progress Notes (Signed)
[name] ([id number]) Visit Report for 02/23/2018 Chief Complaint Document Details Patient Name: [name], [name] Date of Service: 02/23/2018 8:30 AM Medical Record Number: [id number] Patient Account Number: [id number] Date of Birth/Sex: 10/06/1961 (57 y.o. M) Treating RN: Ahmed Prima Primary Care Provider: [name] Other Clinician: Referring Provider: [name] Treating Provider/Extender: Cathie Olden in Treatment: 1 Information Obtained from: Patient Chief Complaint He is here for evaluation of right lower extremity ulcer Electronic Signature(s) Signed: 02/23/2018 9:13:31 AM By: Lawanda Cousins Entered By: Lawanda Cousins on 02/23/2018 09:13:31 [name] ([id number]) -------------------------------------------------------------------------------- HPI Details Patient Name: [name], [name] Date of Service: 02/23/2018 8:30 AM Medical Record Number: [id number] Patient Account Number: [id number] Date of Birth/Sex: Jul 26, 1961 (57 y.o. M) Treating RN: Ahmed Prima Primary Care Provider: [name] Other Clinician: Referring Provider: [name] Treating Provider/Extender: Cathie Olden in Treatment: 1 History of Present Illness HPI Description: 02/16/18-He is here for initial evaluation of a right lower extremity ulcer. He was here in 2017, referred to vein and vascular and was initiated on compression therapy with no intervention. He states that over the last few months he has experienced some fluctuations in his weight which resulted in increased lower extremity edema. He developed a blister with ulceration to the right medical lower leg which has improved but not healed. He has made changes to his diet, is now on low dose diuretic. He is compliant in wearing compression stockings, he believes 20-56mmHg. I expect this to be healed at next weeks appointment; we will treat with hydrofera blue and 3 layer compression. He is a diet  controlled diabetic, last A1c 6.9 last month. 02/23/18- He is here for followup evaluation of a RLE ulceration. He is essentially healed but will err on the side of caution and compress him for an additional week, plan for discharge next week. Electronic Signature(s) Signed: 02/23/2018 9:15:10 AM By: Lawanda Cousins Entered By: Lawanda Cousins on 02/23/2018 09:15:10 [name] ([id number]) -------------------------------------------------------------------------------- Physical Exam Details Patient Name: [name], [name] Date of Service: 02/23/2018 8:30 AM Medical Record Number: [id number] Patient Account Number: [id number] Date of Birth/Sex: 05-30-1961 (57 y.o. M) Treating RN: Ahmed Prima Primary Care Provider: [name] Other Clinician: Referring Provider: [name] Treating Provider/Extender: Lawanda Cousins Weeks in Treatment: 1 Electronic Signature(s) Signed: 02/23/2018 9:16:00 AM By: Lawanda Cousins Entered By: Lawanda Cousins on 02/23/2018 09:15:59 [name] ([id number]) -------------------------------------------------------------------------------- Physician Orders Details Patient Name: [name], [name] Date of Service: 02/23/2018 8:30 AM Medical Record Number: [id number] Patient Account Number: [id number] Date of Birth/Sex: February 13, 1961 (57 y.o. M) Treating RN: Ahmed Prima Primary Care Provider: [name] Other Clinician: Referring Provider: [name] Treating Provider/Extender: Cathie Olden in Treatment: 1 Verbal / Phone Orders: Yes Clinician: Pinkerton, Debi Read Back and Verified: Yes Diagnosis Coding Wound Cleansing o Clean wound with Normal Saline. Anesthetic (add to Medication List) o Topical Lidocaine 4% cream applied to wound bed prior to debridement (In Clinic Only). Skin Barriers/Peri-Wound Care o Moisturizing lotion Primary Wound Dressing Wound #2 Right,Medial Lower Leg o Mepitel One Contact layer o Other: -  calcium alginate Secondary Dressing o ABD pad Dressing Change Frequency o Change dressing every week Follow-up Appointments o Return Appointment in 1 week. Edema Control o 3 Layer Compression System - Right Lower Extremity - unna to anchor Additional Orders / Instructions o Increase protein intake. Patient Medications Allergies: No Known Drug Allergies Notifications Medication Indication Start End lidocaine DOSE 1 - topical 4 % cream - 1 cream topical Electronic  Signature(s) Signed: 02/23/2018 4:47:35 PM By: Lawanda Cousins Entered By: Lawanda Cousins on 02/23/2018 09:16:38 [name] ([id number]) -------------------------------------------------------------------------------- Prescription 02/23/2018 Patient Name: [name] Provider: Lawanda Cousins NP Date of Birth: Dec 21, 1960 NPI#: [id number] Sex: M DEA#: [id number] Phone #: [phone] License #: Patient Address: [facility] [place], [place] [address] [address] [place], [place] [address] [phone] Allergies No Known Drug Allergies Medication Medication: Route: Strength: Form: lidocaine 4 % topical cream topical 4% cream Class: TOPICAL LOCAL ANESTHETICS Dose: Frequency / Time: Indication: 1 1 cream topical Number of Refills: Number of Units: 0 Generic Substitution: Start Date: End Date: One Time Use: Substitution Permitted No Note to Pharmacy: Signature(s): Date(s): Electronic Signature(s) Signed: 02/23/2018 4:47:35 PM By: Lawanda Cousins Entered By: Lawanda Cousins on 02/23/2018 09:16:38 [name] ([id number]) --------------------------------------------------------------------------------  Problem List Details Patient Name: [name], [name] Date of Service: 02/23/2018 8:30 AM Medical Record Number: [id number] Patient Account Number: [id number] Date of Birth/Sex: October 04, 1961 (57 y.o.  M) Treating RN: Ahmed Prima Primary Care Provider: [name] Other Clinician: Referring Provider: [name] Treating Provider/Extender: Cathie Olden in Treatment: 1 Active Problems ICD-10 Impacting Encounter Code Description Active Date Wound Healing Diagnosis R60.9 Edema, unspecified 02/16/2018 Yes I87.331 Chronic venous hypertension (idiopathic) with ulcer and 02/16/2018 Yes inflammation of right lower extremity Inactive Problems Resolved Problems Electronic Signature(s) Signed: 02/23/2018 9:12:56 AM By: Lawanda Cousins Entered By: Lawanda Cousins on 02/23/2018 09:12:55 Hurlock, Jolene [name] ([id number]) -------------------------------------------------------------------------------- Progress Note Details Patient Name: [name], [name] Date of Service: 02/23/2018 8:30 AM Medical Record Number: [id number] Patient Account Number: [id number] Date of Birth/Sex: 03/11/1961 (57 y.o. M) Treating RN: Ahmed Prima Primary Care Provider: [name] Other Clinician: Referring Provider: [name] Treating Provider/Extender: Cathie Olden in Treatment: 1 Subjective Chief Complaint Information obtained from Patient He is here for evaluation of right lower extremity ulcer History of Present Illness (HPI) 02/16/18-He is here for initial evaluation of a right lower extremity ulcer. He was here in 2017, referred to vein and vascular and was initiated on compression therapy with no intervention. He states that over the last few months he has experienced some fluctuations in his weight which resulted in increased lower extremity edema. He developed a blister with ulceration to the right medical lower leg which has improved but not healed. He has made changes to his diet, is now on low dose diuretic. He is compliant in wearing compression stockings, he believes 20-33mmHg. I expect this to be healed at next weeks appointment; we will treat with hydrofera blue and 3  layer compression. He is a diet controlled diabetic, last A1c 6.9 last month. 02/23/18- He is here for followup evaluation of a RLE ulceration. He is essentially healed but will err on the side of caution and compress him for an additional week, plan for discharge next week. Patient History Information obtained from Patient. Family History Cancer - Mother,Paternal Grandparents, Diabetes - Mother,Siblings,Paternal Grandparents,Father, Heart Disease - Paternal Grandparents,Father,Maternal Grandparents, Hypertension - Mother,Father,Siblings,Paternal Grandparents,Maternal Grandparents,Maternal Grandparents, Stroke - Paternal Grandparents,Maternal Grandparents, Thyroid Problems - Paternal Grandparents. Social History Never smoker, Marital Status - Single, Alcohol Use - Rarely, Drug Use - No History, Caffeine Use - Daily. Medical And Surgical History Notes Constitutional Symptoms (General Health) HTN; Diabetes controlled with diet; lower Back pain Objective Constitutional Vitals Time Taken: 8:43 AM, Height: 73 in, Weight: 320 lbs, BMI: 42.2, Temperature: 98.4 F, Pulse: 79 bpm, Respiratory El[place], Lamin T. ([id number]) Rate:  16 breaths/min, Blood Pressure: 154/81 mmHg. Integumentary (Hair, Skin) Wound #2 status is Open. Original cause of wound was Gradually Appeared. The wound is located on the Right,Medial Lower Leg. The wound measures 1.6cm length x 1.5cm width x 0.1cm depth; 1.885cm^2 area and 0.188cm^3 volume. There is no tunneling or undermining noted. There is a medium amount of serous drainage noted. The wound margin is flat and intact. There is large (67-100%) pink granulation within the wound bed. There is a small (1-33%) amount of necrotic tissue within the wound bed including Adherent Slough. The periwound skin appearance exhibited: Hemosiderin Staining. The periwound skin appearance did not exhibit: Callus, Crepitus, Excoriation, Induration, Rash, Scarring, Dry/Scaly, Maceration,  Atrophie Blanche, Cyanosis, Ecchymosis, Mottled, Pallor, Rubor, Erythema. Periwound temperature was noted as No Abnormality. Assessment Active Problems ICD-10 R60.9 - Edema, unspecified I87.331 - Chronic venous hypertension (idiopathic) with ulcer and inflammation of right lower extremity Plan Wound Cleansing: Clean wound with Normal Saline. Anesthetic (add to Medication List): Topical Lidocaine 4% cream applied to wound bed prior to debridement (In Clinic Only). Skin Barriers/Peri-Wound Care: Moisturizing lotion Primary Wound Dressing: Wound #2 Right,Medial Lower Leg: Mepitel One Contact layer Other: - calcium alginate Secondary Dressing: ABD pad Dressing Change Frequency: Change dressing every week Follow-up Appointments: Return Appointment in 1 week. Edema Control: 3 Layer Compression System - Right Lower Extremity - unna to anchor Additional Orders / Instructions: Increase protein intake. The following medication(s) was prescribed: lidocaine topical 4 % cream 1 1 cream topical was prescribed at facility 1. follow up next week, plan to discharge at that time [name], Bradley ([id number]) Electronic Signature(s) Signed: 02/23/2018 9:16:59 AM By: Lawanda Cousins Entered By: Lawanda Cousins on 02/23/2018 09:16:58 Ballow, Jolene [name] ([id number]) -------------------------------------------------------------------------------- ROS/PFSH Details Patient Name: [name], [name] Date of Service: 02/23/2018 8:30 AM Medical Record Number: [id number] Patient Account Number: [id number] Date of Birth/Sex: [birth year] (57 y.o. M) Treating RN: Ahmed Prima Primary Care Provider: [name] Other Clinician: Referring Provider: [name] Treating Provider/Extender: Cathie Olden in Treatment: 1 Information Obtained From Patient Wound History Do you currently have one or more open woundso Yes How many open wounds do you currently haveo 1 Approximately how long have you had  your woundso 3 weeks How have you been treating your wound(s) until nowo compression stockings only Has your wound(s) ever healed and then re-openedo No Have you had any lab work done in the past montho No Have you tested positive for an antibiotic resistant organism (MRSA, VRE)o No Have you tested positive for osteomyelitis (bone infection)o No Have you had any tests for circulation on your legso Yes Who ordered the testo [facility] Where was the test doneo [facility] Constitutional Symptoms (General Health) Medical History: Past Medical History Notes: HTN; Diabetes controlled with diet; lower Back pain Eyes Medical History: Negative for: Cataracts; Glaucoma; Optic Neuritis Ear/Nose/Mouth/Throat Medical History: Negative for: Chronic sinus problems/congestion; Middle ear problems Hematologic/Lymphatic Medical History: Positive for: Lymphedema Negative for: Anemia; Hemophilia; Human Immunodeficiency Virus; Sickle Cell Disease Respiratory Medical History: Negative for: Aspiration; Asthma; Chronic Obstructive Pulmonary Disease (COPD); Pneumothorax; Sleep Apnea; Tuberculosis Cardiovascular Medical History: Positive for: Hypertension; Peripheral Venous Disease Negative for: Angina; Arrhythmia; Congestive Heart Failure; Coronary Artery Disease; Deep Vein Thrombosis; Hypotension; [name], [name] ([id number]) Myocardial Infarction; Peripheral Arterial Disease; Phlebitis; Vasculitis Gastrointestinal Medical History: Negative for: Cirrhosis ; Colitis; Crohnos; Hepatitis A; Hepatitis B; Hepatitis C Endocrine Medical History: Positive for: Type II Diabetes Negative for: Type I Diabetes Time with diabetes: 2009 Treated with: Diet Genitourinary  Medical History: Negative for: End Stage Renal Disease Immunological Medical History: Negative for: Lupus Erythematosus; Raynaudos; Scleroderma Integumentary (Skin) Medical History: Negative for: History of Burn Musculoskeletal Medical  History: Negative for: Gout; Rheumatoid Arthritis; Osteoarthritis; Osteomyelitis Neurologic Medical History: Positive for: Neuropathy Negative for: Dementia; Quadriplegia; Paraplegia; Seizure Disorder Oncologic Medical History: Negative for: Received Chemotherapy; Received Radiation Psychiatric Medical History: Negative for: Anorexia/bulimia; Confinement Anxiety Immunizations Pneumococcal Vaccine: Received Pneumococcal Vaccination: No Implantable Devices Family and Social History [name], Bradley Mill T. ([id number]) Cancer: Yes - Mother,Paternal Grandparents; Diabetes: Yes - Mother,Siblings,Paternal Grandparents,Father; Heart Disease: Yes - Paternal Grandparents,Father,Maternal Grandparents; Hypertension: Yes - Mother,Father,Siblings,Paternal Grandparents,Maternal Grandparents,Maternal Grandparents; Stroke: Yes - Paternal Grandparents,Maternal Grandparents; Thyroid Problems: Yes - Paternal Grandparents; Never smoker; Marital Status - Single; Alcohol Use: Rarely; Drug Use: No History; Caffeine Use: Daily; Advanced Directives: No; Patient does not want information on Advanced Directives; Living Will: No; Medical Power of Attorney: No Physician Affirmation I have reviewed and agree with the above information. Electronic Signature(s) Signed: 02/23/2018 4:32:30 PM By: Alric Quan Signed: 02/23/2018 4:47:35 PM By: Lawanda Cousins Entered By: Lawanda Cousins on 02/23/2018 09:15:22 Bartko, Jolene [name] ([id number]) -------------------------------------------------------------------------------- [place] Details Patient Name: [name], [name] Date of Service: 02/23/2018 Medical Record Number: [id number] Patient Account Number: [id number] Date of Birth/Sex: 30-Apr-1961 (57 y.o. M) Treating RN: Ahmed Prima Primary Care Provider: [name] Other Clinician: Referring Provider: [name] Treating Provider/Extender: Cathie Olden in Treatment: 1 Diagnosis Coding ICD-10 Codes Code  Description R60.9 Edema, unspecified I87.331 Chronic venous hypertension (idiopathic) with ulcer and inflammation of right lower extremity Facility Procedures CPT4 Code Description: [id number] (Facility Use Only) [phone] - [facility]PRS LWR RT LEG Modifier: Quantity: 1 Physician Procedures CPT4: Description Modifier Quantity Code [id number] - WC PHYS LEVEL 3 - EST PT 1 ICD-10 Diagnosis Description R60.9 Edema, unspecified I87.331 Chronic venous hypertension (idiopathic) with ulcer and inflammation of right lower extremity Electronic Signature(s) Signed: 02/23/2018 4:32:30 PM By: Alric Quan Signed: 02/23/2018 4:47:35 PM By: Lawanda Cousins Previous Signature: 02/23/2018 9:17:17 AM Version By: Lawanda Cousins Entered By: Alric Quan on 02/23/2018 09:27:06

## 2018-03-03 ENCOUNTER — Encounter: Payer: Managed Care, Other (non HMO) | Admitting: Physician Assistant

## 2018-03-03 DIAGNOSIS — E11621 Type 2 diabetes mellitus with foot ulcer: Secondary | ICD-10-CM | POA: Diagnosis not present

## 2018-03-07 NOTE — Progress Notes (Signed)
FIN, HUPP ([id number]) Visit Report for 03/03/2018 Chief Complaint Document Details Patient Name: [name], [name] Date of Service: 03/03/2018 8:00 AM Medical Record Number: [id number] Patient Account Number: [id number] Date of Birth/Sex: [birth year] (57 y.o. M) Treating RN: Montey Hora Primary Care Provider: [name] Other Clinician: Referring Provider: [name] Treating Provider/Extender: Melburn Hake, HOYT Weeks in Treatment: 2 Information Obtained from: Patient Chief Complaint He is here for evaluation of right lower extremity ulcer Electronic Signature(s) Signed: 03/05/2018 12:59:06 AM By: Worthy Keeler PA-C Entered By: Worthy Keeler on 03/03/2018 08:27:45 [place]eman, Jolene Schimke ([id number]) -------------------------------------------------------------------------------- HPI Details Patient Name: [name], [name] Date of Service: 03/03/2018 8:00 AM Medical Record Number: [id number] Patient Account Number: [id number] Date of Birth/Sex: December 15, 1960 (57 y.o. M) Treating RN: Montey Hora Primary Care Provider: [name] Other Clinician: Referring Provider: [name] Treating Provider/Extender: Melburn Hake, HOYT Weeks in Treatment: 2 History of Present Illness HPI Description: 02/16/18-He is here for initial evaluation of a right lower extremity ulcer. He was here in 2017, referred to vein and vascular and was initiated on compression therapy with no intervention. He states that over the last few months he has experienced some fluctuations in his weight which resulted in increased lower extremity edema. He developed a blister with ulceration to the right medical lower leg which has improved but not healed. He has made changes to his diet, is now on low dose diuretic. He is compliant in wearing compression stockings, he believes 20-43mmHg. I expect this to be healed at next weeks appointment; we will treat with hydrofera blue and 3 layer compression. He  is a diet controlled diabetic, last A1c 6.9 last month. 02/23/18- He is here for followup evaluation of a RLE ulceration. He is essentially healed but will err on the side of caution and compress him for an additional week, plan for discharge next week. 03/03/18 on evaluation today patient appears to be doing better compared to what I see in previous notes in regard to his lower extremity venous ulcer on the right lower extremity. He does still have a little bit of opening we were hoping it would be healed this week it appears that the mepitel actually cause some maceration underlined. Fortunately there does not appear to be any evidence of infection which is good news. He has been tolerating the dressing changes without complication including the wraps. He states he is getting ready to go to the beach for a four day weekend. Electronic Signature(s) Signed: 03/05/2018 12:59:06 AM By: Worthy Keeler PA-C Entered By: Worthy Keeler on 03/03/2018 08:36:01 [place], Jolene Schimke ([id number]) -------------------------------------------------------------------------------- Physical Exam Details Patient Name: [name], [name] Date of Service: 03/03/2018 8:00 AM Medical Record Number: [id number] Patient Account Number: [id number] Date of Birth/Sex: 1961-11-19 (57 y.o. M) Treating RN: Montey Hora Primary Care Provider: [name] Other Clinician: Referring Provider: [name] Treating Provider/Extender: Melburn Hake, HOYT Weeks in Treatment: 2 Constitutional Chronically ill appearing but in no apparent acute distress. Respiratory normal breathing without difficulty. clear to auscultation bilaterally. Cardiovascular regular rate and rhythm with normal S1, S2. 1+ pitting edema of the bilateral lower extremities. Psychiatric this patient is able to make decisions and demonstrates good insight into disease process. Alert and Oriented x 3. pleasant and cooperative. Notes Patient's ulceration  appears to be very shallow venous ulceration at this point there does not appear to be any evidence of infection which is good news. His swelling also seems to be doing well with the compression wrap  he does have his compression stockings which he ordered several new pairs of as well. Electronic Signature(s) Signed: 03/05/2018 12:59:06 AM By: Worthy Keeler PA-C Entered By: Worthy Keeler on 03/03/2018 08:36:48 [place]ruin, Jolene Schimke ([id number]) -------------------------------------------------------------------------------- Physician Orders Details Patient Name: [name], [name] Date of Service: 03/03/2018 8:00 AM Medical Record Number: [id number] Patient Account Number: [id number] Date of Birth/Sex: 07-23-1961 (57 y.o. M) Treating RN: Montey Hora Primary Care Provider: [name] Other Clinician: Referring Provider: [name] Treating Provider/Extender: Melburn Hake, HOYT Weeks in Treatment: 2 Verbal / Phone Orders: No Diagnosis Coding ICD-10 Coding Code Description R60.9 Edema, unspecified I87.331 Chronic venous hypertension (idiopathic) with ulcer and inflammation of right lower extremity Wound Cleansing Wound #2 Right,Medial Lower Leg o Clean wound with Normal Saline. o May Shower, gently pat wound dry prior to applying new dressing. Anesthetic (add to Medication List) Wound #2 Right,Medial Lower Leg o Topical Lidocaine 4% cream applied to wound bed prior to debridement (In Clinic Only). Primary Wound Dressing Wound #2 Right,Medial Lower Leg o Other: - cutimed siltec sorbact Dressing Change Frequency Wound #2 Right,Medial Lower Leg o Change dressing every week Follow-up Appointments Wound #2 Right,Medial Lower Leg o Return Appointment in 1 week. Edema Control Wound #2 Right,Medial Lower Leg o Patient to wear own compression stockings Additional Orders / Instructions Wound #2 Right,Medial Lower Leg o Increase protein intake. Electronic  Signature(s) Signed: 03/03/2018 4:45:51 PM By: Montey Hora Signed: 03/05/2018 12:59:06 AM By: Worthy Keeler PA-C Entered By: Montey Hora on 03/03/2018 08:40:16 [place], CAMDYN BESKE ([id number]) [place], Jolene Schimke ([id number]) -------------------------------------------------------------------------------- Problem List Details Patient Name: Sroka, Elester T. Date of Service: 03/03/2018 8:00 AM Medical Record Number: [id number] Patient Account Number: [id number] Date of Birth/Sex: [birth year] (57 y.o. M) Treating RN: Montey Hora Primary Care Provider: [name] Other Clinician: Referring Provider: [name] Treating Provider/Extender: Melburn Hake, HOYT Weeks in Treatment: 2 Active Problems ICD-10 Impacting Encounter Code Description Active Date Wound Healing Diagnosis R60.9 Edema, unspecified 02/16/2018 Yes I87.331 Chronic venous hypertension (idiopathic) with ulcer and 02/16/2018 Yes inflammation of right lower extremity Inactive Problems Resolved Problems Electronic Signature(s) Signed: 03/05/2018 12:59:06 AM By: Worthy Keeler PA-C Entered By: Worthy Keeler on 03/03/2018 08:27:25 [place], Jolene Schimke ([id number]) -------------------------------------------------------------------------------- Progress Note Details Patient Name: Loeffler, Rollin T. Date of Service: 03/03/2018 8:00 AM Medical Record Number: [id number] Patient Account Number: [id number] Date of Birth/Sex: Mar 31, 1961 (57 y.o. M) Treating RN: Montey Hora Primary Care Provider: [name] Other Clinician: Referring Provider: [name] Treating Provider/Extender: Melburn Hake, HOYT Weeks in Treatment: 2 Subjective Chief Complaint Information obtained from Patient He is here for evaluation of right lower extremity ulcer History of Present Illness (HPI) 02/16/18-He is here for initial evaluation of a right lower extremity ulcer. He was here in 2017, referred to vein and vascular and was  initiated on compression therapy with no intervention. He states that over the last few months he has experienced some fluctuations in his weight which resulted in increased lower extremity edema. He developed a blister with ulceration to the right medical lower leg which has improved but not healed. He has made changes to his diet, is now on low dose diuretic. He is compliant in wearing compression stockings, he believes 20-83mmHg. I expect this to be healed at next weeks appointment; we will treat with hydrofera blue and 3 layer compression. He is a diet controlled diabetic, last A1c 6.9 last month. 02/23/18- He is here for followup evaluation of a RLE ulceration. He is essentially  healed but will err on the side of caution and compress him for an additional week, plan for discharge next week. 03/03/18 on evaluation today patient appears to be doing better compared to what I see in previous notes in regard to his lower extremity venous ulcer on the right lower extremity. He does still have a little bit of opening we were hoping it would be healed this week it appears that the mepitel actually cause some maceration underlined. Fortunately there does not appear to be any evidence of infection which is good news. He has been tolerating the dressing changes without complication including the wraps. He states he is getting ready to go to the beach for a four day weekend. Patient History Information obtained from Patient. Family History Cancer - Mother,Paternal Grandparents, Diabetes - Mother,Siblings,Paternal Grandparents,Father, Heart Disease - Paternal Grandparents,Father,Maternal Grandparents, Hypertension - Mother,Father,Siblings,Paternal Grandparents,Maternal Grandparents,Maternal Grandparents, Stroke - Paternal Grandparents,Maternal Grandparents, Thyroid Problems - Paternal Grandparents. Social History Never smoker, Marital Status - Single, Alcohol Use - Rarely, Drug Use - No History, Caffeine  Use - Daily. Medical And Surgical History Notes Constitutional Symptoms (General Health) HTN; Diabetes controlled with diet; lower Back pain Review of Systems (ROS) Constitutional Symptoms (General Health) Denies complaints or symptoms of Fever, Chills. Respiratory The patient has no complaints or symptoms. Cardiovascular Complains or has symptoms of LE edema. Psychiatric Toenjes, DAYLIN GRUSZKA. ([id number]) The patient has no complaints or symptoms. Objective Constitutional Chronically ill appearing but in no apparent acute distress. Vitals Time Taken: 8:24 AM, Height: 73 in, Weight: 320 lbs, BMI: 42.2, Temperature: 98.3 F, Pulse: 64 bpm, Respiratory Rate: 18 breaths/min, Blood Pressure: 158/76 mmHg. Respiratory normal breathing without difficulty. clear to auscultation bilaterally. Cardiovascular regular rate and rhythm with normal S1, S2. 1+ pitting edema of the bilateral lower extremities. Psychiatric this patient is able to make decisions and demonstrates good insight into disease process. Alert and Oriented x 3. pleasant and cooperative. General Notes: Patient's ulceration appears to be very shallow venous ulceration at this point there does not appear to be any evidence of infection which is good news. His swelling also seems to be doing well with the compression wrap he does have his compression stockings which he ordered several new pairs of as well. Integumentary (Hair, Skin) Wound #2 status is Open. Original cause of wound was Gradually Appeared. The wound is located on the Right,Medial Lower Leg. The wound measures 1.5cm length x 1.6cm width x 0.1cm depth; 1.885cm^2 area and 0.188cm^3 volume. There is no tunneling or undermining noted. There is a large amount of serous drainage noted. The wound margin is flat and intact. There is large (67-100%) pink granulation within the wound bed. There is a small (1-33%) amount of necrotic tissue within the wound bed including Adherent  Slough. The periwound skin appearance exhibited: Maceration, Hemosiderin Staining. The periwound skin appearance did not exhibit: Callus, Crepitus, Excoriation, Induration, Rash, Scarring, Dry/Scaly, Atrophie Blanche, Cyanosis, Ecchymosis, Mottled, Pallor, Rubor, Erythema. Periwound temperature was noted as No Abnormality. Assessment Active Problems ICD-10 R60.9 - Edema, unspecified I87.331 - Chronic venous hypertension (idiopathic) with ulcer and inflammation of right lower extremity Shibley, Dhiren T. ([id number]) Plan Wound Cleansing: Wound #2 Right,Medial Lower Leg: Clean wound with Normal Saline. May Shower, gently pat wound dry prior to applying new dressing. Anesthetic (add to Medication List): Wound #2 Right,Medial Lower Leg: Topical Lidocaine 4% cream applied to wound bed prior to debridement (In Clinic Only). Primary Wound Dressing: Wound #2 Right,Medial Lower Leg: Other: - cutimed siltec sorbact Dressing  Change Frequency: Wound #2 Right,Medial Lower Leg: Change dressing every week Follow-up Appointments: Wound #2 Right,Medial Lower Leg: Return Appointment in 1 week. Edema Control: Wound #2 Right,Medial Lower Leg: Patient to wear own compression stockings Additional Orders / Instructions: Wound #2 Right,Medial Lower Leg: Increase protein intake. At this point I'm gonna suggest that we actually switch to [employer] to help with the drainage for the next week. I'm gonna suggest also that we go ahead and allow him to utilize his compression stockings over top of this in order to keep his swelling under control. Patient is in agreement with this plan. He will be leaving for the beach today actually and will be back after four day weekend. We will see him for reevaluation next week and hopefully at that point you will be completely healed. Patient is in agreement with this plan. Please see above for specific wound care orders. We will see patient for re-evaluation  in 1 week(s) here in the clinic. If anything worsens or changes patient will contact our office for additional recommendations. Electronic Signature(s) Signed: 03/05/2018 12:59:06 AM By: Worthy Keeler PA-C Entered By: Worthy Keeler on 03/03/2018 08:40:42 [place], Jolene Schimke ([id number]) -------------------------------------------------------------------------------- ROS/PFSH Details Patient Name: Mercer, Zerick T. Date of Service: 03/03/2018 8:00 AM Medical Record Number: [id number] Patient Account Number: [id number] Date of Birth/Sex: 05-12-1961 (57 y.o. M) Treating RN: Montey Hora Primary Care Provider: [name] Other Clinician: Referring Provider: [name] Treating Provider/Extender: Melburn Hake, HOYT Weeks in Treatment: 2 Information Obtained From Patient Wound History Do you currently have one or more open woundso Yes How many open wounds do you currently haveo 1 Approximately how long have you had your woundso 3 weeks How have you been treating your wound(s) until nowo compression stockings only Has your wound(s) ever healed and then re-openedo No Have you had any lab work done in the past montho No Have you tested positive for an antibiotic resistant organism (MRSA, VRE)o No Have you tested positive for osteomyelitis (bone infection)o No Have you had any tests for circulation on your legso Yes Who ordered the testo [facility] Where was the test doneo [facility] Constitutional Symptoms (General Health) Complaints and Symptoms: Negative for: Fever; Chills Medical History: Past Medical History Notes: HTN; Diabetes controlled with diet; lower Back pain Cardiovascular Complaints and Symptoms: Positive for: LE edema Medical History: Positive for: Hypertension; Peripheral Venous Disease Negative for: Angina; Arrhythmia; Congestive Heart Failure; Coronary Artery Disease; Deep Vein Thrombosis; Hypotension; Myocardial Infarction; Peripheral Arterial Disease; Phlebitis;  Vasculitis Eyes Medical History: Negative for: Cataracts; Glaucoma; Optic Neuritis Ear/Nose/Mouth/Throat Medical History: Negative for: Chronic sinus problems/congestion; Middle ear problems Hematologic/Lymphatic Medical History: Positive for: Lymphedema Zoss, Aydan T. ([id number]) Negative for: Anemia; Hemophilia; Human Immunodeficiency Virus; Sickle Cell Disease Respiratory Complaints and Symptoms: No Complaints or Symptoms Medical History: Negative for: Aspiration; Asthma; Chronic Obstructive Pulmonary Disease (COPD); Pneumothorax; Sleep Apnea; Tuberculosis Gastrointestinal Medical History: Negative for: Cirrhosis ; Colitis; Crohnos; Hepatitis A; Hepatitis B; Hepatitis C Endocrine Medical History: Positive for: Type II Diabetes Negative for: Type I Diabetes Time with diabetes: 2009 Treated with: Diet Genitourinary Medical History: Negative for: End Stage Renal Disease Immunological Medical History: Negative for: Lupus Erythematosus; Raynaudos; Scleroderma Integumentary (Skin) Medical History: Negative for: History of Burn Musculoskeletal Medical History: Negative for: Gout; Rheumatoid Arthritis; Osteoarthritis; Osteomyelitis Neurologic Medical History: Positive for: Neuropathy Negative for: Dementia; Quadriplegia; Paraplegia; Seizure Disorder Oncologic Medical History: Negative for: Received Chemotherapy; Received Radiation Psychiatric Complaints and Symptoms: No Complaints or Symptoms Uemura,  FERMON URETA ([id number]) Medical History: Negative for: Anorexia/bulimia; Confinement Anxiety Immunizations Pneumococcal Vaccine: Received Pneumococcal Vaccination: No Implantable Devices Family and Social History Cancer: Yes - Mother,Paternal Grandparents; Diabetes: Yes - Mother,Siblings,Paternal Grandparents,Father; Heart Disease: Yes - Paternal Grandparents,Father,Maternal Grandparents; Hypertension: Yes - Mother,Father,Siblings,Paternal Grandparents,Maternal  Grandparents,Maternal Grandparents; Stroke: Yes - Paternal Grandparents,Maternal Grandparents; Thyroid Problems: Yes - Paternal Grandparents; Never smoker; Marital Status - Single; Alcohol Use: Rarely; Drug Use: No History; Caffeine Use: Daily; Advanced Directives: No; Patient does not want information on Advanced Directives; Living Will: No; Medical Power of Attorney: No Physician Affirmation I have reviewed and agree with the above information. Electronic Signature(s) Signed: 03/03/2018 4:45:51 PM By: Montey Hora Signed: 03/05/2018 12:59:06 AM By: Worthy Keeler PA-C Entered By: Worthy Keeler on 03/03/2018 08:36:31 [place], Jolene Schimke ([id number]) -------------------------------------------------------------------------------- SuperBill Details Patient Name: Machorro, Johneric T. Date of Service: 03/03/2018 Medical Record Number: [id number] Patient Account Number: [id number] Date of Birth/Sex: Oct 04, 1961 (57 y.o. M) Treating RN: Montey Hora Primary Care Provider: [name] Other Clinician: Referring Provider: [name] Treating Provider/Extender: Melburn Hake, HOYT Weeks in Treatment: 2 Diagnosis Coding ICD-10 Codes Code Description R60.9 Edema, unspecified I87.331 Chronic venous hypertension (idiopathic) with ulcer and inflammation of right lower extremity Physician Procedures CPT4: Description Modifier Quantity Code [id number] - WC PHYS LEVEL 3 - EST PT 1 ICD-10 Diagnosis Description R60.9 Edema, unspecified I87.331 Chronic venous hypertension (idiopathic) with ulcer and inflammation of right lower extremity Electronic Signature(s) Signed: 03/05/2018 12:59:06 AM By: Worthy Keeler PA-C Entered By: Worthy Keeler on 03/03/2018 08:40:15

## 2018-03-07 NOTE — Progress Notes (Signed)
[name] ([id number]) Visit Report for 03/03/2018 Arrival Information Details Patient Name: [name], [name] Date of Service: 03/03/2018 8:00 AM Medical Record Number: [id number] Patient Account Number: [id number] Date of Birth/Sex: Feb 01, 1961 (57 y.o. M) Treating RN: [name] Primary Care [name]: [name] Other Clinician: Referring [name]: [name] Treating [name]/Extender: [name] [name]: 2 Visit Information History Since Last Visit All ordered tests and consults were completed: No Patient Arrived: Ambulatory Added or deleted any medications: No Arrival Time: 08:12 Any new allergies or adverse reactions: No Accompanied By: self Had a fall or experienced change in No Transfer Assistance: None activities of daily living that may affect Patient Identification Verified: Yes risk of falls: Secondary Verification Process Completed: Yes Signs or symptoms of abuse/neglect since last visito No Patient Requires Transmission-Based No Hospitalized since last visit: No Precautions: Implantable device outside of the clinic excluding No Patient Has Alerts: Yes cellular tissue based products placed in the center Patient Alerts: DMII since last visit: Has Dressing in Place as Prescribed: Yes Has Compression in Place as Prescribed: Yes Pain Present Now: No Electronic Signature(s) Signed: 03/03/2018 4:04:12 PM By: [name] Entered By: [name] on 03/03/2018 08:12:54 [name] ([id number]) -------------------------------------------------------------------------------- Clinic Level of Care Assessment Details Patient Name: [name], [name] Date of Service: 03/03/2018 8:00 AM Medical Record Number: [id number] Patient Account Number: [id number] Date of Birth/Sex: June 04, 1961 (57 y.o. M) Treating RN: Montey Hora Primary Care Rishawn Walck: [name] Other Clinician: Referring Tyese Finken: [name] Treating  Artina Minella/Extender: [name] [name]: 2 Clinic Level of Care Assessment Items TOOL 4 Quantity Score []  - Use when only an EandM is performed on FOLLOW-UP visit 0 ASSESSMENTS - Nursing Assessment / Reassessment X - Reassessment of Co-morbidities (includes updates in patient status) 1 10 X- 1 5 Reassessment of Adherence to [name] Plan ASSESSMENTS - Wound and Skin Assessment / Reassessment X - Simple Wound Assessment / Reassessment - one wound 1 5 []  - 0 Complex Wound Assessment / Reassessment - multiple wounds []  - 0 Dermatologic / Skin Assessment (not related to wound area) ASSESSMENTS - Focused Assessment X - Circumferential Edema Measurements - multi extremities 1 5 []  - 0 Nutritional Assessment / Counseling / Intervention X- 1 5 Lower Extremity Assessment (monofilament, tuning fork, pulses) []  - 0 Peripheral Arterial Disease Assessment (using hand held doppler) ASSESSMENTS - Ostomy and/or Continence Assessment and Care []  - Incontinence Assessment and Management 0 []  - 0 Ostomy Care Assessment and Management (repouching, etc.) PROCESS - Coordination of Care X - Simple Patient / Family Education for ongoing care 1 15 []  - 0 Complex (extensive) Patient / Family Education for ongoing care []  - 0 Staff obtains Programmer, systems, Records, Test Results / Process Orders []  - 0 Staff telephones HHA, Nursing Homes / Clarify orders / etc []  - 0 Routine Transfer to another Facility (non-emergent condition) []  - 0 Routine Hospital Admission (non-emergent condition) []  - 0 New Admissions / Biomedical engineer / Ordering NPWT, Apligraf, etc. []  - 0 Emergency Hospital Admission (emergent condition) X- 1 10 Simple Discharge Coordination [name], [name] ([id number]) []  - 0 Complex (extensive) Discharge Coordination PROCESS - Special Needs []  - Pediatric / Minor Patient Management 0 []  - 0 Isolation Patient Management []  - 0 Hearing / Language / Visual special  needs []  - 0 Assessment of Community assistance (transportation, D/C planning, etc.) []  - 0 Additional assistance / Altered mentation []  - 0 Support Surface(s) Assessment (bed, cushion, seat, etc.) INTERVENTIONS -  Wound Cleansing / Measurement X - Simple Wound Cleansing - one wound 1 5 []  - 0 Complex Wound Cleansing - multiple wounds X- 1 5 Wound Imaging (photographs - any number of wounds) []  - 0 Wound Tracing (instead of photographs) X- 1 5 Simple Wound Measurement - one wound []  - 0 Complex Wound Measurement - multiple wounds INTERVENTIONS - Wound Dressings X - Small Wound Dressing one or multiple wounds 1 10 []  - 0 Medium Wound Dressing one or multiple wounds []  - 0 Large Wound Dressing one or multiple wounds []  - 0 Application of Medications - topical []  - 0 Application of Medications - injection INTERVENTIONS - Miscellaneous []  - External ear exam 0 []  - 0 Specimen Collection (cultures, biopsies, blood, body fluids, etc.) []  - 0 Specimen(s) / Culture(s) sent or taken to Lab for analysis []  - 0 Patient Transfer (multiple staff / Civil Service fast streamer / Similar devices) []  - 0 Simple Staple / Suture removal (25 or less) []  - 0 Complex Staple / Suture removal (26 or more) []  - 0 Hypo / Hyperglycemic Management (close monitor of Blood Glucose) []  - 0 Ankle / Brachial Index (ABI) - do not check if billed separately X- 1 5 Vital Signs [name], [name] ([id number]) Has the patient been seen at the hospital within the last three years: Yes Total Score: 85 Level Of Care: New/Established - Level 3 Electronic Signature(s) Signed: 03/03/2018 4:45:51 PM By: Montey Hora Entered By: Montey Hora on 03/03/2018 08:40:47 [name] ([id number]) -------------------------------------------------------------------------------- Encounter Discharge Information Details Patient Name: [name], [name] Date of Service: 03/03/2018 8:00 AM Medical Record Number:  [id number] Patient Account Number: [id number] Date of Birth/Sex: [birth year] (57 y.o. M) Treating RN: Montey Hora Primary Care Jalia Zuniga: [name] Other Clinician: Referring Joren Rehm: [name] Treating Nikko Goldwire/Extender: [name] [name]: 2 Encounter Discharge Information Items Discharge Pain Level: 0 Discharge Condition: Stable Ambulatory Status: Ambulatory Discharge Destination: Home Transportation: Private Auto Accompanied By: self Schedule Follow-up Appointment: Yes Medication Reconciliation completed and No provided to Patient/Care Dathan Attia: Provided on Clinical Summary of Care: 03/03/2018 Form Type Recipient Paper Patient CC Electronic Signature(s) Signed: 03/03/2018 4:32:37 PM By: Ruthine Dose Entered By: Ruthine Dose on 03/03/2018 08:45:29 Badami, Jolene [name] ([id number]) -------------------------------------------------------------------------------- Lower Extremity Assessment Details Patient Name: [name], [name] Date of Service: 03/03/2018 8:00 AM Medical Record Number: [id number] Patient Account Number: [id number] Date of Birth/Sex: [birth year] (57 y.o. M) Treating RN: [name] Primary Care Graycie Halley: [name] Other Clinician: Referring Sav[place]: [name] Treating Jaryn Rosko/Extender: [name] [name]: 2 Edema Assessment Assessed: [Left: No] [Right: No] [Left: Edema] [Right: :] Calf Left: Right: Point of Measurement: 37 cm From Medial Instep 44 cm cm Ankle Left: Right: Point of Measurement: 13 cm From Medial Instep 28.5 cm cm Vascular Assessment Pulses: Dorsalis Pedis Palpable: [Left:Yes] Posterior Tibial Extremity colors, hair growth, and conditions: Extremity Color: [Left:Hyperpigmented] Temperature of Extremity: [Left:Warm] Capillary Refill: [Left:< 3 seconds] Toe Nail Assessment Left: Right: Thick: Yes Discolored: Yes Deformed: No Improper Length and Hygiene: Yes Electronic  Signature(s) Signed: 03/03/2018 4:04:12 PM By: [name] Entered By: [name] on 03/03/2018 08:22:30 Greer, Jolene [name] ([id number]) -------------------------------------------------------------------------------- Multi Wound Chart Details Patient Name: [name], [name] Date of Service: 03/03/2018 8:00 AM Medical Record Number: [id number] Patient Account Number: [id number] Date of Birth/Sex: 03/20/1961 (57 y.o. M) Treating RN: Montey Hora Primary Care Delissa Silba: [name] Other Clinician: Referring Jairen Goldfarb: [name] Treating Christhoper Busbee/Extender: STONE III, [name] [name]: 2 Vital Signs Height(in):  73 Pulse(bpm): 64 Weight(lbs): 320 Blood Pressure(mmHg): 158/76 Body Mass Index(BMI): 42 Temperature(F): 98.3 Respiratory Rate 18 (breaths/min): Photos: [2:No Photos] [N/A:N/A] Wound Location: [2:Right Lower Leg - Medial] [N/A:N/A] Wounding Event: [2:Gradually Appeared] [N/A:N/A] Primary Etiology: [2:Venous Leg Ulcer] [N/A:N/A] Comorbid History: [2:Lymphedema, Hypertension, Peripheral Venous Disease, Type II Diabetes, Neuropathy] [N/A:N/A] Date Acquired: [2:01/16/2018] [N/A:N/A] Bradley of [name]: [2:2] [N/A:N/A] Wound Status: [2:Open] [N/A:N/A] Measurements L x W x D [2:1.5x1.6x0.1] [N/A:N/A] (cm) Area (cm) : [2:1.885] [N/A:N/A] Volume (cm) : [2:0.188] [N/A:N/A] % Reduction in Area: [2:46.40%] [N/A:N/A] % Reduction in Volume: [2:46.60%] [N/A:N/A] Classification: [2:Partial Thickness] [N/A:N/A] Exudate Amount: [2:Large] [N/A:N/A] Exudate Type: [2:Serous] [N/A:N/A] Exudate Color: [2:amber] [N/A:N/A] Wound Margin: [2:Flat and Intact] [N/A:N/A] Granulation Amount: [2:Large (67-100%)] [N/A:N/A] Granulation Quality: [2:Pink] [N/A:N/A] Necrotic Amount: [2:Small (1-33%)] [N/A:N/A] Exposed Structures: [2:Fascia: No Fat Layer (Subcutaneous Tissue) Exposed: No Tendon: No Muscle: No Joint: No Bone: No] [N/A:N/A] Epithelialization: [2:Medium  (34-66%)] [N/A:N/A] Periwound Skin Texture: [2:Excoriation: No Induration: No Callus: No Crepitus: No] [N/A:N/A] Rash: No Scarring: No Periwound Skin Moisture: Maceration: Yes N/A N/A Dry/Scaly: No Periwound Skin Color: Hemosiderin Staining: Yes N/A N/A Atrophie Blanche: No Cyanosis: No Ecchymosis: No Erythema: No Mottled: No Pallor: No Rubor: No Temperature: No Abnormality N/A N/A Tenderness on Palpation: No N/A N/A Wound Preparation: Ulcer Cleansing: N/A N/A Rinsed/Irrigated with Saline, Other: soap and water Topical Anesthetic Applied: Other: lidocaine 4% [name] Notes Electronic Signature(s) Signed: 03/03/2018 4:45:51 PM By: Montey Hora Entered By: Montey Hora on 03/03/2018 08:31:08 Owczarzak, Jolene [name] ([id number]) -------------------------------------------------------------------------------- [place] Details Patient Name: [name], [name] Date of Service: 03/03/2018 8:00 AM Medical Record Number: [id number] Patient Account Number: [id number] Date of Birth/Sex: August 23, 1961 (56 y.o. M) Treating RN: Montey Hora Primary Care Dyamon Sosinski: [name] Other Clinician: Referring Lynwood Kubisiak: [name] Treating Soleia Badolato/Extender: [name] [name]: 2 Active Inactive ` Orientation to the Wound Care Program Nursing Diagnoses: Knowledge deficit related to the wound healing center program Goals: Patient/caregiver will verbalize understanding of the [facility] Date Initiated: 02/16/2018 Target Resolution Date: 02/25/2018 Goal Status: Active Interventions: Provide education on orientation to the wound center Notes: ` Pain, Acute or Chronic Nursing Diagnoses: Pain, acute or chronic: actual or potential Potential alteration in comfort, pain Goals: Patient/caregiver will verbalize adequate pain control between visits Date Initiated: 02/16/2018 Target Resolution Date: 05/27/2018 Goal Status:  Active Interventions: Complete pain assessment as per visit requirements Notes: ` Wound/Skin Impairment Nursing Diagnoses: Impaired tissue integrity Knowledge deficit related to ulceration/compromised skin integrity Goals: Ulcer/skin breakdown will have a volume reduction of 80% by week 12 Date Initiated: 02/16/2018 Target Resolution Date: 05/20/2018 Goal Status: Active [name], [name] ([id number]) Interventions: Assess patient/caregiver ability to perform ulcer/skin care regimen upon admission and as needed Assess ulceration(s) every visit Notes: Electronic Signature(s) Signed: 03/03/2018 4:45:51 PM By: Montey Hora Entered By: Montey Hora on 03/03/2018 08:31:00 Balicki, Jolene [name] ([id number]) -------------------------------------------------------------------------------- Pain Assessment Details Patient Name: [name], [name] Date of Service: 03/03/2018 8:00 AM Medical Record Number: [id number] Patient Account Number: [id number] Date of Birth/Sex: 01-Oct-1961 (57 y.o. M) Treating RN: [name] Primary Care Reggie Bise: [name] Other Clinician: Referring Neema Fluegge: [name] Treating Abdifatah Colquhoun/Extender: [name] [name]: 2 Active Problems Location of Pain Severity and Description of Pain Patient Has Paino No Site Locations Pain Management and Medication Current Pain Management: Electronic Signature(s) Signed: 03/03/2018 4:04:12 PM By: [name] Entered By: [name] on 03/03/2018 08:13:01 Mayson, Jolene [name] ([id number]) -------------------------------------------------------------------------------- Patient/Caregiver Education Details Patient Name: [name], [name] Date of  Service: 03/03/2018 8:00 AM Medical Record Number: [id number] Patient Account Number: [id number] Date of Birth/Gender: Mar 01, 1961 (57 y.o. M) Treating RN: Montey Hora Primary Care Physician: [name] Other Clinician: Referring Physician:  Einar [name] Treating Physician/Extender: Sharalyn Ink in [name]: 2 Education Assessment Education Provided To: Patient Education Topics Provided Wound/Skin Impairment: Handouts: Other: wound care as ordered Methods: Demonstration, Explain/Verbal Responses: State content correctly Electronic Signature(s) Signed: 03/03/2018 4:45:51 PM By: Montey Hora Entered By: Montey Hora on 03/03/2018 08:41:56 Scobee, Jolene [name] ([id number]) -------------------------------------------------------------------------------- Wound Assessment Details Patient Name: [name], [name] Date of Service: 03/03/2018 8:00 AM Medical Record Number: [id number] Patient Account Number: [id number] Date of Birth/Sex: [birth year] (57 y.o. M) Treating RN: [name] Primary Care Fread Kottke: [name] Other Clinician: Referring Shelise Maron: [name] Treating Crystelle Ferrufino/Extender: [name] [name]: 2 Wound Status Wound Number: 2 Primary Venous Leg Ulcer Etiology: Wound Location: Right Lower Leg - Medial Wound Open Wounding Event: Gradually Appeared Status: Date Acquired: 01/16/2018 Comorbid Lymphedema, Hypertension, Peripheral Bradley Of [name]: 2 History: Venous Disease, Type II Diabetes, Neuropathy Clustered Wound: No Photos Photo Uploaded By: Gretta Cool, BSN, RN, CWS, Bradley on 03/03/2018 16:51:38 Wound Measurements Length: (cm) 1.5 Width: (cm) 1.6 Depth: (cm) 0.1 Area: (cm) 1.885 Volume: (cm) 0.188 % Reduction in Area: 46.4% % Reduction in Volume: 46.6% Epithelialization: Medium (34-66%) Tunneling: No Undermining: No Wound Description Classification: Partial Thickness Wound Margin: Flat and Intact Exudate Amount: Large Exudate Type: Serous Exudate Color: amber Foul Odor After Cleansing: No Slough/Fibrino Yes Wound Bed Granulation Amount: Large (67-100%) Exposed Structure Granulation Quality: Pink Fascia Exposed: No Necrotic Amount: Small  (1-33%) Fat Layer (Subcutaneous Tissue) Exposed: No Necrotic Quality: Adherent Slough Tendon Exposed: No Muscle Exposed: No Joint Exposed: No Bone Exposed: No Periwound Skin Texture [name], [name] ([id number]) Texture Color No Abnormalities Noted: No No Abnormalities Noted: No Callus: No Atrophie Blanche: No Crepitus: No Cyanosis: No Excoriation: No Ecchymosis: No Induration: No Erythema: No Rash: No Hemosiderin Staining: Yes Scarring: No Mottled: No Pallor: No Moisture Rubor: No No Abnormalities Noted: No Dry / Scaly: No Temperature / Pain Maceration: Yes Temperature: No Abnormality Wound Preparation Ulcer Cleansing: Rinsed/Irrigated with Saline, Other: soap and water, Topical Anesthetic Applied: Other: lidocaine 4%, [name] Notes Wound #2 (Right, Medial Lower Leg) 1. Cleansed with: Clean wound with Normal Saline 2. Anesthetic Topical Lidocaine 4% cream to wound bed prior to debridement 4. Dressing Applied: Other dressing (specify in notes) 7. Secured with Patient to wear own compression stockings Notes cutimed siltec sorbact Electronic Signature(s) Signed: 03/03/2018 4:04:12 PM By: [name] Entered By: [name] on 03/03/2018 08:20:50 Biggers, Jolene [name] ([id number]) -------------------------------------------------------------------------------- Vitals Details Patient Name: Martello, [name] Date of Service: 03/03/2018 8:00 AM Medical Record Number: [id number] Patient Account Number: [id number] Date of Birth/Sex: Feb 01, 1961 (57 y.o. M) Treating RN: [name] Primary Care Younes Degeorge: [name] Other Clinician: Referring Johnae Friley: [name] Treating Yordan Martindale/Extender: [name] [name]: 2 Vital Signs Time Taken: 08:24 Temperature (F): 98.3 Height (in): 73 Pulse (bpm): 64 Weight (lbs): 320 Respiratory Rate (breaths/min): 18 Body Mass Index (BMI): 42.2 Blood Pressure (mmHg): 158/76 Reference  Range: 80 - 120 mg / dl Electronic Signature(s) Signed: 03/03/2018 4:04:12 PM By: [name] Entered By: [name] on 03/03/2018 08:24:48

## 2018-03-10 ENCOUNTER — Encounter: Payer: Managed Care, Other (non HMO) | Admitting: Physician Assistant

## 2018-03-10 DIAGNOSIS — E11621 Type 2 diabetes mellitus with foot ulcer: Secondary | ICD-10-CM | POA: Diagnosis not present

## 2018-03-15 ENCOUNTER — Encounter: Payer: Managed Care, Other (non HMO) | Admitting: Dietician

## 2018-03-15 ENCOUNTER — Encounter: Payer: Self-pay | Admitting: Dietician

## 2018-03-15 VITALS — BP 136/88 | Ht 73.0 in | Wt 318.7 lb

## 2018-03-15 DIAGNOSIS — E119 Type 2 diabetes mellitus without complications: Secondary | ICD-10-CM

## 2018-03-15 DIAGNOSIS — E11621 Type 2 diabetes mellitus with foot ulcer: Secondary | ICD-10-CM | POA: Diagnosis not present

## 2018-03-15 NOTE — Progress Notes (Signed)
Diabetes Self-Management Education  Visit Type: Follow-up  Appt. Start Time: 0915 Appt. End Time: 2706  03/15/2018  Mr. [name], identified by name and date of birth, is a 57 y.o. male with a diagnosis of Diabetes:  .   ASSESSMENT  Blood pressure 136/88, height 6\' 1"  (1.854 m), weight (!) 318 lb 11.2 oz (144.6 kg). Body mass index is 42.05 kg/m.  Diabetes Self-Management Education - 03/15/18 0925      Visit Information   Visit Type  Follow-up      Complications   How often do you check your blood sugar?  0 times/day (not testing)    Have you had a dilated eye exam in the past 12 months?  Yes appt 03/30/18    Have you had a dental exam in the past 12 months?  Yes    Are you checking your feet?  Yes    How many days per week are you checking your feet?  7      Dietary Intake   Breakfast  3 meals and 1-2 snacks daily    Beverage(s)  patient estimates 200-200oz fluids daily, all sugar free      Exercise   Exercise Type  Light (walking / raking leaves)    How many days per week to you exercise?  5    How many minutes per day do you exercise?  15    Total minutes per week of exercise  75      Patient Education   Nutrition management   Role of diet in the treatment of diabetes and the relationship between the three main macronutrients and blood glucose level;Food label reading, portion sizes and measuring food.;Meal timing in regards to the patients' current diabetes medication.;Information on hints to eating out and maintain blood glucose control.;Meal options for control of blood glucose level and chronic complications.;Other (comment) basic CHO needs, 1900kcal plan with 40-45%CHO    Physical activity and exercise   Role of exercise on diabetes management, blood pressure control and cardiac health.      Post-Education Assessment   Patient understands the diabetes disease and treatment process.  Demonstrates understanding / competency    Patient understands incorporating  nutritional management into lifestyle.  Demonstrates understanding / competency    Patient undertands incorporating physical activity into lifestyle.  Demonstrates understanding / competency    Patient understands using medications safely.  Demonstrates understanding / competency    Patient understands monitoring blood glucose, interpreting and using results  Needs Review    Patient understands prevention, detection, and treatment of acute complications.  Demonstrates understanding / competency    Patient understands prevention, detection, and treatment of chronic complications.  Demonstrates understanding / competency    Patient understands how to develop strategies to address psychosocial issues.  Needs Review    Patient understands how to develop strategies to promote health/change behavior.  Demonstrates understanding / competency      Outcomes   Expected Outcomes  Demonstrated interest in learning. Expect positive outcomes    Program Status  Completed       Individualized Plan for Diabetes Self-Management Training:   Learning Objective:  Patient will have a greater understanding of diabetes self-management. Patient education plan is to attend individual and/or group sessions per assessed needs and concerns.  [name] has made positive diet changes, and has increased his physical activity. He has lost almost 7lbs in the past 3 weeks, and is motivated to continue working on weight loss. He will work  further on portion control of starchy foods and some meats, and on a more consistent eating pattern.    Plan:   Patient Instructions   Great job choosing lean proteins and low-carb veggies!  Allow for 45-60grams of healthy carb choices with meals. Keep portions to about fist-size (1 cup) or less.   Aim to eat something about every 4-5 hours during the day. Allow at least 2 hours between meals and snacks, try not to go longer than 6 hours without anything.    Expected Outcomes:   Demonstrated interest in learning. Expect positive outcomes  Education material provided: Planning A Balanced Meal; Quick and Easy Meal Ideas; Eating Healthy When Dining Out  If problems or questions, patient to contact team via:  Phone and Email

## 2018-03-15 NOTE — Patient Instructions (Signed)
   Great job choosing lean proteins and low-carb veggies!  Allow for 45-60grams of healthy carb choices with meals. Keep portions to about fist-size (1 cup) or less.   Aim to eat something about every 4-5 hours during the day. Allow at least 2 hours between meals and snacks, try not to go longer than 6 hours without anything.

## 2018-03-17 NOTE — Progress Notes (Signed)
IZZAK, [name] ([id number]) Visit Report for 03/10/2018 Chief Complaint Document Details Patient Name: [name], [name] Date of Service: 03/10/2018 8:45 AM Medical Record Number: [id number] Patient Account Number: [id number] Date of Birth/Sex: [birth year] (57 y.o. M) Treating RN: Montey Hora Primary Care Provider: [name] Other Clinician: Referring Provider: [name] Treating Provider/Extender: Melburn Hake, HOYT Weeks in Treatment: 3 Information Obtained from: Patient Chief Complaint He is here for evaluation of right lower extremity ulcer Electronic Signature(s) Signed: 03/10/2018 5:53:05 PM By: Worthy Keeler PA-C Entered By: Worthy Keeler on 03/10/2018 09:01:56 [name] ([id number]) -------------------------------------------------------------------------------- HPI Details Patient Name: [name], [name] Date of Service: 03/10/2018 8:45 AM Medical Record Number: [id number] Patient Account Number: [id number] Date of Birth/Sex: 08-27-1961 (57 y.o. M) Treating RN: Montey Hora Primary Care Provider: [name] Other Clinician: Referring Provider: [name] Treating Provider/Extender: Melburn Hake, HOYT Weeks in Treatment: 3 History of Present Illness HPI Description: 02/16/18-He is here for initial evaluation of a right lower extremity ulcer. He was here in 2017, referred to vein and vascular and was initiated on compression therapy with no intervention. He states that over the last few months he has experienced some fluctuations in his weight which resulted in increased lower extremity edema. He developed a blister with ulceration to the right medical lower leg which has improved but not healed. He has made changes to his diet, is now on low dose diuretic. He is compliant in wearing compression stockings, he believes 20-20mmHg. I expect this to be healed at next weeks appointment; we will treat with hydrofera blue and 3 layer compression. He is  a diet controlled diabetic, last A1c 6.9 last month. 02/23/18- He is here for followup evaluation of a RLE ulceration. He is essentially healed but will err on the side of caution and compress him for an additional week, plan for discharge next week. 03/03/18 on evaluation today patient appears to be doing better compared to what I see in previous notes in regard to his lower extremity venous ulcer on the right lower extremity. He does still have a little bit of opening we were hoping it would be healed this week it appears that the mepitel actually cause some maceration underlined. Fortunately there does not appear to be any evidence of infection which is good news. He has been tolerating the dressing changes without complication including the wraps. He states he is getting ready to go to the beach for a four day weekend. 03/10/18 on evaluation today patient appears to be completely healed in regard to his right medial malleolus ulcer. He has been tolerating the wraps without complication. Overall I feel like he has made significant progress in the short time I've been taking care of him. He likewise is very pleased with what he sees. Electronic Signature(s) Signed: 03/10/2018 5:53:05 PM By: Worthy Keeler PA-C Entered By: Worthy Keeler on 03/10/2018 17:48:01 [name] ([id number]) -------------------------------------------------------------------------------- Physical Exam Details Patient Name: [name], [name] Date of Service: 03/10/2018 8:45 AM Medical Record Number: [id number] Patient Account Number: [id number] Date of Birth/Sex: 11-20-1961 (57 y.o. M) Treating RN: Montey Hora Primary Care Provider: [name] Other Clinician: Referring Provider: [name] Treating Provider/Extender: STONE III, HOYT Weeks in Treatment: 3 Constitutional Well-nourished and well-hydrated in no acute distress. Respiratory normal breathing without difficulty. Psychiatric this  patient is able to make decisions and demonstrates good insight into disease process. Alert and Oriented x 3. pleasant and cooperative. Notes Patient's wounds appear to be completely closed  at this point today and this is great news. I'm gonna suggest that he go back to wearing his compression stockings which he had from previous recommendations. Electronic Signature(s) Signed: 03/10/2018 5:53:05 PM By: Worthy Keeler PA-C Entered By: Worthy Keeler on 03/10/2018 17:48:34 [name] ([id number]) -------------------------------------------------------------------------------- Physician Orders Details Patient Name: [name], [name] Date of Service: 03/10/2018 8:45 AM Medical Record Number: [id number] Patient Account Number: [id number] Date of Birth/Sex: [birth year] (57 y.o. M) Treating RN: Montey Hora Primary Care Provider: [name] Other Clinician: Referring Provider: [name] Treating Provider/Extender: Melburn Hake, HOYT Weeks in Treatment: 3 Verbal / Phone Orders: No Diagnosis Coding ICD-10 Coding Code Description R60.9 Edema, unspecified I87.331 Chronic venous hypertension (idiopathic) with ulcer and inflammation of right lower extremity Discharge From [facility] Services o Discharge from [place] - continue wearing your compression socks daily Electronic Signature(s) Signed: 03/10/2018 3:07:03 PM By: Montey Hora Signed: 03/10/2018 5:53:05 PM By: Worthy Keeler PA-C Entered By: Montey Hora on 03/10/2018 09:26:39 [name] ([id number]) -------------------------------------------------------------------------------- Problem List Details Patient Name: [name], [name] Date of Service: 03/10/2018 8:45 AM Medical Record Number: [id number] Patient Account Number: [id number] Date of Birth/Sex: September 02, 1961 (57 y.o. M) Treating RN: Montey Hora Primary Care Provider: [name] Other Clinician: Referring Provider: [name] Treating  Provider/Extender: Melburn Hake, HOYT Weeks in Treatment: 3 Active Problems ICD-10 Impacting Encounter Code Description Active Date Wound Healing Diagnosis R60.9 Edema, unspecified 02/16/2018 Yes I87.331 Chronic venous hypertension (idiopathic) with ulcer and 02/16/2018 Yes inflammation of right lower extremity Inactive Problems Resolved Problems Electronic Signature(s) Signed: 03/10/2018 5:53:05 PM By: Worthy Keeler PA-C Entered By: Worthy Keeler on 03/10/2018 09:01:51 Meader, Jolene [name] ([id number]) -------------------------------------------------------------------------------- Progress Note Details Patient Name: [name], [name] Date of Service: 03/10/2018 8:45 AM Medical Record Number: [id number] Patient Account Number: [id number] Date of Birth/Sex: 15-Jan-1961 (57 y.o. M) Treating RN: Montey Hora Primary Care Provider: [name] Other Clinician: Referring Provider: [name] Treating Provider/Extender: Melburn Hake, HOYT Weeks in Treatment: 3 Subjective Chief Complaint Information obtained from Patient He is here for evaluation of right lower extremity ulcer History of Present Illness (HPI) 02/16/18-He is here for initial evaluation of a right lower extremity ulcer. He was here in 2017, referred to vein and vascular and was initiated on compression therapy with no intervention. He states that over the last few months he has experienced some fluctuations in his weight which resulted in increased lower extremity edema. He developed a blister with ulceration to the right medical lower leg which has improved but not healed. He has made changes to his diet, is now on low dose diuretic. He is compliant in wearing compression stockings, he believes 20-82mmHg. I expect this to be healed at next weeks appointment; we will treat with hydrofera blue and 3 layer compression. He is a diet controlled diabetic, last A1c 6.9 last month. 02/23/18- He is here for followup evaluation of  a RLE ulceration. He is essentially healed but will err on the side of caution and compress him for an additional week, plan for discharge next week. 03/03/18 on evaluation today patient appears to be doing better compared to what I see in previous notes in regard to his lower extremity venous ulcer on the right lower extremity. He does still have a little bit of opening we were hoping it would be healed this week it appears that the mepitel actually cause some maceration underlined. Fortunately there does not appear to be any evidence of infection which  is good news. He has been tolerating the dressing changes without complication including the wraps. He states he is getting ready to go to the beach for a four day weekend. 03/10/18 on evaluation today patient appears to be completely healed in regard to his right medial malleolus ulcer. He has been tolerating the wraps without complication. Overall I feel like he has made significant progress in the short time I've been taking care of him. He likewise is very pleased with what he sees. Patient History Information obtained from Patient. Family History Cancer - Mother,Paternal Grandparents, Diabetes - Mother,Siblings,Paternal Grandparents,Father, Heart Disease - Paternal Grandparents,Father,Maternal Grandparents, Hypertension - Mother,Father,Siblings,Paternal Grandparents,Maternal Grandparents,Maternal Grandparents, Stroke - Paternal Grandparents,Maternal Grandparents, Thyroid Problems - Paternal Grandparents. Social History Never smoker, Marital Status - Single, Alcohol Use - Rarely, Drug Use - No History, Caffeine Use - Daily. Medical And Surgical History Notes Constitutional Symptoms (General Health) HTN; Diabetes controlled with diet; lower Back pain Review of Systems (ROS) Constitutional Symptoms (General Health) Denies complaints or symptoms of Fever, Chills, Marked Weight Change. Respiratory [name], [name] ([id number]) The patient  has no complaints or symptoms. Cardiovascular Complains or has symptoms of LE edema. Psychiatric The patient has no complaints or symptoms. Objective Constitutional Well-nourished and well-hydrated in no acute distress. Vitals Time Taken: 9:08 AM, Height: 73 in, Weight: 320 lbs, BMI: 42.2, Temperature: 97.9 F, Pulse: 66 bpm, Respiratory Rate: 18 breaths/min, Blood Pressure: 164/91 mmHg. Respiratory normal breathing without difficulty. Psychiatric this patient is able to make decisions and demonstrates good insight into disease process. Alert and Oriented x 3. pleasant and cooperative. General Notes: Patient's wounds appear to be completely closed at this point today and this is great news. I'm gonna suggest that he go back to wearing his compression stockings which he had from previous recommendations. Integumentary (Hair, Skin) Wound #2 status is Healed - Epithelialized. Original cause of wound was Gradually Appeared. The wound is located on the Right,Medial Lower Leg. The wound measures 0cm length x 0cm width x 0cm depth; 0cm^2 area and 0cm^3 volume. There is no tunneling or undermining noted. There is a none present amount of drainage noted. The wound margin is flat and intact. There is no granulation within the wound bed. There is no necrotic tissue within the wound bed. The periwound skin appearance exhibited: Maceration, Hemosiderin Staining. The periwound skin appearance did not exhibit: Callus, Crepitus, Excoriation, Induration, Rash, Scarring, Dry/Scaly, Atrophie Blanche, Cyanosis, Ecchymosis, Mottled, Pallor, Rubor, Erythema. Periwound temperature was noted as No Abnormality. Assessment Active Problems ICD-10 R60.9 - Edema, unspecified I87.331 - Chronic venous hypertension (idiopathic) with ulcer and inflammation of right lower extremity Jeangilles, [name] ([id number]) Plan Discharge From [facility] Services: Discharge from [place] - continue wearing your compression  socks daily We will discontinue wound care services at this time and the patient is going to go back to wearing his compression stockings he has several new pairs that he purchased so that he will have plenty to use as he needs to. I'm in agreement with this plan. We will see him in the future as needed if anything changes or worsens or any new openings occur. Electronic Signature(s) Signed: 03/10/2018 5:53:05 PM By: Worthy Keeler PA-C Entered By: Worthy Keeler on 03/10/2018 17:48:49 Humann, Jolene [name] ([id number]) -------------------------------------------------------------------------------- ROS/PFSH Details Patient Name: [name], [name] Date of Service: 03/10/2018 8:45 AM Medical Record Number: [id number] Patient Account Number: [id number] Date of Birth/Sex: Apr 29, 1961 (57 y.o. M) Treating RN: Montey Hora Primary Care Provider: [name] Other  Clinician: Referring Provider: [name] Treating Provider/Extender: Melburn Hake, HOYT Weeks in Treatment: 3 Information Obtained From Patient Wound History Do you currently have one or more open woundso Yes How many open wounds do you currently haveo 1 Approximately how long have you had your woundso 3 weeks How have you been treating your wound(s) until nowo compression stockings only Has your wound(s) ever healed and then re-openedo No Have you had any lab work done in the past montho No Have you tested positive for an antibiotic resistant organism (MRSA, VRE)o No Have you tested positive for osteomyelitis (bone infection)o No Have you had any tests for circulation on your legso Yes Who ordered the testo [facility] Where was the test doneo [facility] Constitutional Symptoms (General Health) Complaints and Symptoms: Negative for: Fever; Chills; Marked Weight Change Medical History: Past Medical History Notes: HTN; Diabetes controlled with diet; lower Back pain Cardiovascular Complaints and Symptoms: Positive for: LE  edema Medical History: Positive for: Hypertension; Peripheral Venous Disease Negative for: Angina; Arrhythmia; Congestive Heart Failure; Coronary Artery Disease; Deep Vein Thrombosis; Hypotension; Myocardial Infarction; Peripheral Arterial Disease; Phlebitis; Vasculitis Eyes Medical History: Negative for: Cataracts; Glaucoma; Optic Neuritis Ear/Nose/Mouth/Throat Medical History: Negative for: Chronic sinus problems/congestion; Middle ear problems Hematologic/Lymphatic Medical History: Positive for: Lymphedema [name], [name] ([id number]) Negative for: Anemia; Hemophilia; Human Immunodeficiency Virus; Sickle Cell Disease Respiratory Complaints and Symptoms: No Complaints or Symptoms Medical History: Negative for: Aspiration; Asthma; Chronic Obstructive Pulmonary Disease (COPD); Pneumothorax; Sleep Apnea; Tuberculosis Gastrointestinal Medical History: Negative for: Cirrhosis ; Colitis; Crohnos; Hepatitis A; Hepatitis B; Hepatitis C Endocrine Medical History: Positive for: Type II Diabetes Negative for: Type I Diabetes Time with diabetes: 2009 Treated with: Diet Genitourinary Medical History: Negative for: End Stage Renal Disease Immunological Medical History: Negative for: Lupus Erythematosus; Raynaudos; Scleroderma Integumentary (Skin) Medical History: Negative for: History of Burn Musculoskeletal Medical History: Negative for: Gout; Rheumatoid Arthritis; Osteoarthritis; Osteomyelitis Neurologic Medical History: Positive for: Neuropathy Negative for: Dementia; Quadriplegia; Paraplegia; Seizure Disorder Oncologic Medical History: Negative for: Received Chemotherapy; Received Radiation Psychiatric Complaints and Symptoms: No Complaints or Symptoms [name], [name] ([id number]) Medical History: Negative for: Anorexia/bulimia; Confinement Anxiety Immunizations Pneumococcal Vaccine: Received Pneumococcal Vaccination: No Implantable Devices Family and Social  History Cancer: Yes - Mother,Paternal Grandparents; Diabetes: Yes - Mother,Siblings,Paternal Grandparents,Father; Heart Disease: Yes - Paternal Grandparents,Father,Maternal Grandparents; Hypertension: Yes - Mother,Father,Siblings,Paternal Grandparents,Maternal Grandparents,Maternal Grandparents; Stroke: Yes - Paternal Grandparents,Maternal Grandparents; Thyroid Problems: Yes - Paternal Grandparents; Never smoker; Marital Status - Single; Alcohol Use: Rarely; Drug Use: No History; Caffeine Use: Daily; Advanced Directives: No; Patient does not want information on Advanced Directives; Living Will: No; Medical Power of Attorney: No Physician Affirmation I have reviewed and agree with the above information. Electronic Signature(s) Signed: 03/10/2018 5:53:05 PM By: Worthy Keeler PA-C Signed: 03/14/2018 4:18:46 PM By: Montey Hora Entered By: Worthy Keeler on 03/10/2018 17:48:17 Mclure, Jolene [name] ([id number]) -------------------------------------------------------------------------------- SuperBill Details Patient Name: [name], [name] Date of Service: 03/10/2018 Medical Record Number: [id number] Patient Account Number: [id number] Date of Birth/Sex: [birth year] (57 y.o. M) Treating RN: Montey Hora Primary Care Provider: [name] Other Clinician: Referring Provider: [name] Treating Provider/Extender: Melburn Hake, HOYT Weeks in Treatment: 3 Diagnosis Coding ICD-10 Codes Code Description R60.9 Edema, unspecified I87.331 Chronic venous hypertension (idiopathic) with ulcer and inflammation of right lower extremity Facility Procedures CPT4 Code: [id number] Description: 30160 - WOUND CARE VISIT-LEV 2 EST PT Modifier: Quantity: 1 Physician Procedures CPT4: Description Modifier Quantity Code [id number] - WC PHYS LEVEL 2 - EST  PT 1 ICD-10 Diagnosis Description I87.331 Chronic venous hypertension (idiopathic) with ulcer and inflammation of right lower extremity R60.9 Edema,  unspecified Electronic Signature(s) Signed: 03/10/2018 5:53:05 PM By: Worthy Keeler PA-C Entered By: Worthy Keeler on 03/10/2018 17:48:59

## 2018-03-18 NOTE — Progress Notes (Addendum)
GATSBY, CHISMAR ([id number]) Visit Report for 03/10/2018 Arrival Information Details Patient Name: [name], [name] Date of Service: 03/10/2018 8:45 AM Medical Record Number: [id number] Patient Account Number: [id number] Date of Birth/Sex: Aug 20, 1961 (57 y.o. M) Treating RN: Roger Shelter Primary Care [name]: [name] Other Clinician: Referring Marieli Rudy: [name] Treating Lajarvis Italiano/Extender: Melburn Hake, HOYT Weeks in Treatment: 3 Visit Information History Since Last Visit All ordered tests and consults were completed: No Patient Arrived: Ambulatory Added or deleted any medications: No Arrival Time: 09:07 Any new allergies or adverse reactions: No Accompanied By: self Had a fall or experienced change in No Transfer Assistance: None activities of daily living that may affect Patient Identification Verified: Yes risk of falls: Secondary Verification Process Completed: Yes Signs or symptoms of abuse/neglect since last visito No Patient Requires Transmission-Based No Hospitalized since last visit: No Precautions: Implantable device outside of the clinic excluding No Patient Has Alerts: Yes cellular tissue based products placed in the center Patient Alerts: DMII since last visit: Pain Present Now: No Electronic Signature(s) Signed: 03/16/2018 7:33:13 AM By: Gretta Cool, BSN, RN, CWS, Kim RN, BSN Previous Signature: 03/13/2018 4:40:07 PM Version By: Roger Shelter Entered By: Gretta Cool, BSN, RN, CWS, Kim on 03/16/2018 07:33:13 [name], [name] ([id number]) -------------------------------------------------------------------------------- Clinic Level of Care Assessment Details Patient Name: [name], [name] Date of Service: 03/10/2018 8:45 AM Medical Record Number: [id number] Patient Account Number: [id number] Date of Birth/Sex: 27-Nov-1960 (57 y.o. M) Treating RN: Montey Hora Primary Care Laini Urick: [name] Other Clinician: Referring Cavion Faiola: [name] Treating Cerissa Zeiger/Extender: Melburn Hake, HOYT Weeks in Treatment: 3 Clinic Level of Care Assessment Items TOOL 4 Quantity Score []  - Use when only an EandM is performed on FOLLOW-UP visit 0 ASSESSMENTS - Nursing Assessment / Reassessment X - Reassessment of Co-morbidities (includes updates in patient status) 1 10 X- 1 5 Reassessment of Adherence to Treatment Plan ASSESSMENTS - Wound and Skin Assessment / Reassessment X - Simple Wound Assessment / Reassessment - one wound 1 5 []  - 0 Complex Wound Assessment / Reassessment - multiple wounds []  - 0 Dermatologic / Skin Assessment (not related to wound area) ASSESSMENTS - Focused Assessment X - Circumferential Edema Measurements - multi extremities 1 5 []  - 0 Nutritional Assessment / Counseling / Intervention X- 1 5 Lower Extremity Assessment (monofilament, tuning fork, pulses) []  - 0 Peripheral Arterial Disease Assessment (using hand held doppler) ASSESSMENTS - Ostomy and/or Continence Assessment and Care []  - Incontinence Assessment and Management 0 []  - 0 Ostomy Care Assessment and Management (repouching, etc.) PROCESS - Coordination of Care X - Simple Patient / Family Education for ongoing care 1 15 []  - 0 Complex (extensive) Patient / Family Education for ongoing care []  - 0 Staff obtains Programmer, systems, Records, Test Results / Process Orders []  - 0 Staff telephones HHA, Nursing Homes / Clarify orders / etc []  - 0 Routine Transfer to another Facility (non-emergent condition) []  - 0 Routine Hospital Admission (non-emergent condition) []  - 0 New Admissions / Biomedical engineer / Ordering NPWT, Apligraf, etc. []  - 0 Emergency Hospital Admission (emergent condition) X- 1 10 Simple Discharge Coordination [name], [name] ([id number]) []  - 0 Complex (extensive) Discharge Coordination PROCESS - Special Needs []  - Pediatric / Minor Patient Management 0 []  - 0 Isolation Patient Management []  - 0 Hearing / Language  / Visual special needs []  - 0 Assessment of Community assistance (transportation, D/C planning, etc.) []  - 0 Additional assistance / Altered mentation []  - 0 Support Surface(s) Assessment (bed, cushion, seat,  etc.) INTERVENTIONS - Wound Cleansing / Measurement X - Simple Wound Cleansing - one wound 1 5 []  - 0 Complex Wound Cleansing - multiple wounds X- 1 5 Wound Imaging (photographs - any number of wounds) []  - 0 Wound Tracing (instead of photographs) X- 1 5 Simple Wound Measurement - one wound []  - 0 Complex Wound Measurement - multiple wounds INTERVENTIONS - Wound Dressings []  - Small Wound Dressing one or multiple wounds 0 []  - 0 Medium Wound Dressing one or multiple wounds []  - 0 Large Wound Dressing one or multiple wounds []  - 0 Application of Medications - topical []  - 0 Application of Medications - injection INTERVENTIONS - Miscellaneous []  - External ear exam 0 []  - 0 Specimen Collection (cultures, biopsies, blood, body fluids, etc.) []  - 0 Specimen(s) / Culture(s) sent or taken to Lab for analysis []  - 0 Patient Transfer (multiple staff / Civil Service fast streamer / Similar devices) []  - 0 Simple Staple / Suture removal (25 or less) []  - 0 Complex Staple / Suture removal (26 or more) []  - 0 Hypo / Hyperglycemic Management (close monitor of Blood Glucose) []  - 0 Ankle / Brachial Index (ABI) - do not check if billed separately X- 1 5 Vital Signs [name], [name] ([id number]) Has the patient been seen at the hospital within the last three years: Yes Total Score: 75 Level Of Care: New/Established - Level 2 Electronic Signature(s) Signed: 03/10/2018 3:07:03 PM By: Montey Hora Entered By: Montey Hora on 03/10/2018 09:26:58 [name], [name] ([id number]) -------------------------------------------------------------------------------- Encounter Discharge Information Details Patient Name: [name], [name] Date of Service: 03/10/2018 8:45 AM Medical Record Number:  [id number] Patient Account Number: [id number] Date of Birth/Sex: [birth year] (57 y.o. M) Treating RN: Montey Hora Primary Care Jozalynn Noyce: [name] Other Clinician: Referring Dashiell Franchino: [name] Treating Maria Coin/Extender: Melburn Hake, HOYT Weeks in Treatment: 3 Encounter Discharge Information Items Discharge Pain Level: 0 Discharge Condition: Stable Ambulatory Status: Ambulatory Discharge Destination: Home Transportation: Private Auto Accompanied By: self Schedule Follow-up Appointment: No Medication Reconciliation completed and No provided to Patient/Care Jourdyn Hasler: Provided on Clinical Summary of Care: 03/10/2018 Form Type Recipient Paper Patient CC Electronic Signature(s) Signed: 03/15/2018 3:44:00 PM By: Ruthine Dose Entered By: Ruthine Dose on 03/10/2018 09:32:38 Azeez, [name] ([id number]) -------------------------------------------------------------------------------- Lower Extremity Assessment Details Patient Name: [name], [name] Date of Service: 03/10/2018 8:45 AM Medical Record Number: [id number] Patient Account Number: [id number] Date of Birth/Sex: 09/01/1961 (57 y.o. M) Treating RN: Roger Shelter Primary Care Mataya Kilduff: [name] Other Clinician: Referring Moni Rothrock: [name] Treating Annalysa Mohammad/Extender: Melburn Hake, HOYT Weeks in Treatment: 3 Edema Assessment Assessed: [Left: No] [Right: No] Edema: [Left: N] [Right: o] Calf Left: Right: Point of Measurement: 37 cm From Medial Instep cm 46 cm Ankle Left: Right: Point of Measurement: 13 cm From Medial Instep cm 30 cm Vascular Assessment Claudication: Claudication Assessment [Right:None] Pulses: Dorsalis Pedis Palpable: [Right:Yes] Posterior Tibial Extremity colors, hair growth, and conditions: Extremity Color: [Right:Hyperpigmented] Hair Growth on Extremity: [Right:Yes] Temperature of Extremity: [Right:Warm] Capillary Refill: [Right:< 3 seconds] Toe Nail Assessment Left:  Right: Thick: No Discolored: No Deformed: No Improper Length and Hygiene: No Electronic Signature(s) Signed: 03/13/2018 4:40:07 PM By: Roger Shelter Entered By: Roger Shelter on 03/10/2018 09:15:01 Cephas, [name] ([id number]) -------------------------------------------------------------------------------- [place] Details Patient Name: Fennel, Everard T. Date of Service: 03/10/2018 8:45 AM Medical Record Number: [id number] Patient Account Number: [id number] Date of Birth/Sex: 04-Mar-1961 (57 y.o. M) Treating RN: Montey Hora Primary Care Harbert Fitterer: [name] Other Clinician: Referring Barrie Wale: [name]  Treating Therese Rocco/Extender: Melburn Hake, HOYT Weeks in Treatment: 3 Active Inactive Electronic Signature(s) Signed: 03/10/2018 3:07:03 PM By: Montey Hora Entered By: Montey Hora on 03/10/2018 09:26:10 Etzkorn, [name] ([id number]) -------------------------------------------------------------------------------- Pain Assessment Details Patient Name: [name], [name] Date of Service: 03/10/2018 8:45 AM Medical Record Number: [id number] Patient Account Number: [id number] Date of Birth/Sex: 24-Dec-1960 (57 y.o. M) Treating RN: Roger Shelter Primary Care Nancy Manuele: [name] Other Clinician: Referring Dardan Shelton: [name] Treating Shaheim Mahar/Extender: Melburn Hake, HOYT Weeks in Treatment: 3 Active Problems Location of Pain Severity and Description of Pain Patient Has Paino No Site Locations Pain Management and Medication Current Pain Management: Electronic Signature(s) Signed: 03/13/2018 4:40:07 PM By: Roger Shelter Entered By: Roger Shelter on 03/10/2018 09:07:50 [name], [name] ([id number]) -------------------------------------------------------------------------------- Patient/Caregiver Education Details Patient Name: [name], [name] Date of Service: 03/10/2018 8:45 AM Medical Record Number: [id number] Patient  Account Number: [id number] Date of Birth/Gender: [birth year] (57 y.o. M) Treating RN: Montey Hora Primary Care Physician: [name] Other Clinician: Referring Physician: Einar [name] Treating Physician/Extender: Sharalyn Ink in Treatment: 3 Education Assessment Education Provided To: Patient Education Topics Provided Venous: Handouts: Other: continue wearing compression Methods: Explain/Verbal Responses: State content correctly Electronic Signature(s) Signed: 03/10/2018 3:07:03 PM By: Montey Hora Entered By: Montey Hora on 03/10/2018 09:28:49 Hankin, [name] ([id number]) -------------------------------------------------------------------------------- Wound Assessment Details Patient Name: [name], [name] Date of Service: 03/10/2018 8:45 AM Medical Record Number: [id number] Patient Account Number: [id number] Date of Birth/Sex: 1961/02/19 (57 y.o. M) Treating RN: Montey Hora Primary Care Laelyn Blumenthal: [name] Other Clinician: Referring Alexiah Koroma: [name] Treating Shevawn Langenberg/Extender: Melburn Hake, HOYT Weeks in Treatment: 3 Wound Status Wound Number: 2 Primary Venous Leg Ulcer Etiology: Wound Location: Right, Medial Lower Leg Wound Healed - Epithelialized Wounding Event: Gradually Appeared Status: Date Acquired: 01/16/2018 Comorbid Lymphedema, Hypertension, Peripheral Weeks Of Treatment: 3 History: Venous Disease, Type II Diabetes, Clustered Wound: No Neuropathy Photos Photo Uploaded By: Roger Shelter on 03/10/2018 16:10:21 Wound Measurements Length: (cm) 0 % Reduct Width: (cm) 0 % Reduct Depth: (cm) 0 Epitheli Area: (cm) 0 Tunneli Volume: (cm) 0 Undermi ion in Area: 100% ion in Volume: 100% alization: Large (67-100%) ng: No ning: No Wound Description Classification: Partial Thickness Wound Margin: Flat and Intact Exudate Amount: None Present Foul Odor After Cleansing: No Slough/Fibrino No Wound Bed Granulation  Amount: None Present (0%) Exposed Structure Necrotic Amount: None Present (0%) Fascia Exposed: No Fat Layer (Subcutaneous Tissue) Exposed: No Tendon Exposed: No Muscle Exposed: No Joint Exposed: No Bone Exposed: No Periwound Skin Texture Texture Color No Abnormalities Noted: No No Abnormalities Noted: No Capriotti, [name] ([id number]) Callus: No Atrophie Blanche: No Crepitus: No Cyanosis: No Excoriation: No Ecchymosis: No Induration: No Erythema: No Rash: No Hemosiderin Staining: Yes Scarring: No Mottled: No Pallor: No Moisture Rubor: No No Abnormalities Noted: No Dry / Scaly: No Temperature / Pain Maceration: Yes Temperature: No Abnormality Wound Preparation Ulcer Cleansing: Rinsed/Irrigated with Saline Topical Anesthetic Applied: None Electronic Signature(s) Signed: 03/10/2018 3:07:03 PM By: Montey Hora Entered By: Montey Hora on 03/10/2018 09:25:55 Hennick, [name] ([id number]) -------------------------------------------------------------------------------- [place] Details Patient Name: Mcmath, Abhay T. Date of Service: 03/10/2018 8:45 AM Medical Record Number: [id number] Patient Account Number: [id number] Date of Birth/Sex: 08-31-1961 (57 y.o. M) Treating RN: Roger Shelter Primary Care Venie Montesinos: [name] Other Clinician: Referring Iley Breeden: [name] Treating Quinley Nesler/Extender: Melburn Hake, HOYT Weeks in Treatment: 3 Vital Signs Time Taken: 09:08 Temperature (F): 97.9 Height (in): 73 Pulse (bpm): 66 Weight (lbs): 320 Respiratory Rate (breaths/min): 18 Body Mass  Index (BMI): 42.2 Blood Pressure (mmHg): 164/91 Reference Range: 80 - 120 mg / dl Electronic Signature(s) Signed: 03/13/2018 4:40:07 PM By: Roger Shelter Entered By: Roger Shelter on 03/10/2018 09:08:45

## 2018-03-21 ENCOUNTER — Ambulatory Visit: Payer: Managed Care, Other (non HMO) | Admitting: Family Medicine

## 2018-03-21 ENCOUNTER — Ambulatory Visit: Payer: Self-pay | Admitting: *Deleted

## 2018-03-21 ENCOUNTER — Encounter: Payer: Self-pay | Admitting: Family Medicine

## 2018-03-21 VITALS — BP 148/88 | HR 68 | Temp 98.6°F | Wt 321.2 lb

## 2018-03-21 DIAGNOSIS — E119 Type 2 diabetes mellitus without complications: Secondary | ICD-10-CM | POA: Diagnosis not present

## 2018-03-21 DIAGNOSIS — H8110 Benign paroxysmal vertigo, unspecified ear: Secondary | ICD-10-CM

## 2018-03-21 DIAGNOSIS — R42 Dizziness and giddiness: Secondary | ICD-10-CM

## 2018-03-21 LAB — GLUCOSE, POCT (MANUAL RESULT ENTRY): POC Glucose: 211 mg/dl — AB (ref 70–99)

## 2018-03-21 MED ORDER — MECLIZINE HCL 25 MG PO TABS
25.0000 mg | ORAL_TABLET | Freq: Three times a day (TID) | ORAL | 0 refills | Status: DC | PRN
Start: 2018-03-21 — End: 2018-07-03

## 2018-03-21 NOTE — Progress Notes (Signed)
Subjective:    [name], male    DOB: [birth year], 57 y.o.   MRN: [id number]  HPI  Mr. [name] is a 57 year old male who presents today with dizziness that started last night and was worse this morning. He reports trigger of bending over this morning which caused dizziness and room spinning and resolved after resting for a "few minutes" He has a history of T2DM. Last fasting glucose on 01/13/18 was 168, he is followed by nutrition and has reported improvement with dietary changes and weight loss; history of  HTN, Chronic venous insufficiency, and GERD. He saw nutrition 03/15/18 and has made dietary changes with weight loss and plans to follow up as recommended  He reports that he does not check his blood sugar regularly but wanted to make sure dizziness was not caused by low blood sugar which he denies a history of hypoglycemia or symptoms however he drank orange juice this morning but dizziness did not improve.  Last A1C was 6.9 on 01/13/18.  He denies polyuria, polyphagia, or polydipsia  Dizziness has improved while resting in chair in exam room and is not present when sitting in a chair.  Associated sinus pressure and mild ear pressure have been present. Treatment with Mucinex has provided benefit for symptoms of sinus pressure that have been present. He reports seasonal allergies that are also flaring up for him at this time.  Dizziness resolves spontaneously however he feels unsteady.  Presyncope/syncope associated: No  Evaluation of provoking factors that can cause symptoms below: Change in head position: No however bending over causes symptom Ear pressure: Mild Coughing/sneezing: No Valsalva: No Occur with standing/getting out of bed: No Lying down/rolling over in bed/bending neck: No Hearing loss: No Visual changes: No  Denies N/V, HA, change in vision, numbness, weakness, abnormal coordination/gait History of cardiac dysfunction: No per patient   Review of  Systems  Constitutional: Negative for chills, fatigue and fever.  HENT: Positive for congestion, rhinorrhea and sinus pressure. Negative for sore throat.   Respiratory: Negative for cough, choking and wheezing.   Cardiovascular: Negative for chest pain and palpitations.  Gastrointestinal: Negative for abdominal pain, diarrhea, nausea and vomiting.  Endocrine: Negative for polydipsia, polyphagia and polyuria.  Musculoskeletal: Negative for myalgias.  Neurological: Positive for dizziness. Negative for weakness, light-headedness and headaches.  Psychiatric/Behavioral:       Denies depressed or anxious mood today   Past Medical History:  Diagnosis Date  . 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 Depression   . Environmental allergies   . Family history of colon cancer   . GERD (gastroesophageal reflux disease)   . History of chicken pox   . Hyperlipidemia   . Hypertension   . Venous stasis   . Vitamin D deficiency      Social History   Socioeconomic History  . Marital status: Widowed    Spouse name: Not on file  . Number of children: Not on file  . Years of education: Not on file  . Highest education level: Not on file  Occupational History  . Not on file  Social Needs  . Financial resource strain: Not on file  . Food insecurity:    Worry: Not on file    Inability: Not on file  . Transportation needs:    Medical: Not on file    Non-medical: Not on file  Tobacco Use  .  Smoking status: Never Smoker  . Smokeless tobacco: Never Used  Substance and Sexual Activity  . Alcohol use: Yes    Alcohol/week: 0.0 - 0.6 oz    Comment: 1 drink every 2 months  . Drug use: No  . Sexual activity: Not on file  Lifestyle  . Physical activity:    Days per week: Not on file    Minutes per session: Not on file  . Stress: Not on file  Relationships  . Social connections:    Talks on phone: Not on file      Gets together: Not on file    Attends religious service: Not on file    Active member of club or organization: Not on file    Attends meetings of clubs or organizations: Not on file    Relationship status: Not on file  . Intimate partner violence:    Fear of current or ex partner: Not on file    Emotionally abused: Not on file    Physically abused: Not on file    Forced sexual activity: Not on file  Other Topics Concern  . Not on file  Social History Narrative  . Not on file    Past Surgical History:  Procedure Laterality Date  . CHOLECYSTECTOMY  2002  . COLONOSCOPY WITH PROPOFOL N/A 06/27/2015   Procedure: COLONOSCOPY WITH PROPOFOL;  Surgeon: Lollie Sails, MD;  [facility];  Service: Endoscopy;  Laterality: N/A;  . HERNIA REPAIR  1962    Family History  Problem Relation Age of Onset  . Stomach cancer Mother   . Arthritis Mother   . Diabetes Mother   . Hypertension Mother   . Hyperlipidemia Father   . Heart disease Father   . Diabetes Father   . Hypertension Father   . Arthritis Maternal Grandmother   . Hyperlipidemia Maternal Grandmother   . Hypertension Maternal Grandmother   . Arthritis Maternal Grandfather   . Hyperlipidemia Maternal Grandfather   . Heart disease Maternal Grandfather   . Hypertension Maternal Grandfather   . Arthritis Paternal Grandmother   . Breast cancer Paternal Grandmother   . Hypertension Paternal Grandmother   . Arthritis Paternal Grandfather   . Hyperlipidemia Paternal Grandfather   . Heart disease Paternal Grandfather   . Diabetes Paternal Grandfather   . Hypertension Paternal Grandfather   . Diabetes Sister     No Known Allergies  Current Outpatient Medications on File Prior to Visit  Medication Sig Dispense Refill  . hydrochlorothiazide (MICROZIDE) 12.5 MG capsule Take 1 capsule (12.5 mg total) by mouth daily. 30 capsule 1  . lisinopril (PRINIVIL,ZESTRIL) 40 MG tablet Take 1 tablet (40 mg total) by mouth  daily. 90 tablet 1  . loratadine (CLARITIN) 10 MG tablet Take 10 mg by mouth daily as needed for allergies.    . ranitidine (ZANTAC) 150 MG tablet Take 150 mg by mouth at bedtime as needed for heartburn.    . rosuvastatin (CRESTOR) 10 MG tablet TAKE 1 TABLET BY MOUTH  DAILY 90 tablet 1  . Vitamin D, Ergocalciferol, (DRISDOL) 50000 units CAPS capsule Take 1 capsule (50,000 Units total) by mouth every 7 (seven) days. 12 capsule 0   No current facility-administered medications on file prior to visit.     BP (!) 148/88   Pulse 68   Temp 98.6 F (37 C) (Oral)   Wt (!) 321 lb 4 oz (145.7 kg)   SpO2 94%   BMI 42.38 kg/m  Objective:   Physical Exam  Constitutional: He is oriented to person, place, and time. He appears well-developed and well-nourished.  HENT:  Right Ear: Tympanic membrane normal.  Left Ear: Tympanic membrane normal.  Nose: Right sinus exhibits no maxillary sinus tenderness and no frontal sinus tenderness. Left sinus exhibits no maxillary sinus tenderness and no frontal sinus tenderness.  Mouth/Throat: Oropharynx is clear and moist.  Erythematous nares  Eyes: Pupils are equal, round, and reactive to light. No scleral icterus.  Neck: Neck supple.  Cardiovascular: Normal rate, regular rhythm, normal heart sounds and intact distal pulses.  Pulmonary/Chest: Effort normal and breath sounds normal. He has no wheezes. He has no rales.  Abdominal: Soft. Bowel sounds are normal. There is no tenderness.  Musculoskeletal: He exhibits no edema.  Lymphadenopathy:    He has no cervical adenopathy.  Neurological: He is alert and oriented to person, place, and time.  II-Visual fields grossly intact. III/IV/VI-Extraocular movements intact. Pupils reactive bilaterally. V/VII-Smile symmetric, equal eyebrow raise, facial sensation intact VIII- Hearing grossly intact XI-bilateral shoulder shrug XII-midline tongue extension Motor: 5/5 bilaterally with normal tone and  bulk Cerebellar: Normal finger-to-nose and normal heel-to-shin test.  Romberg negative Ambulates with a coordinated gait  Dix-Hallpike maneuver produced vertigo; no nystagmus appreciated  Skin: Skin is warm and dry. No erythema.  Psychiatric: He has a normal mood and affect. His behavior is normal. Judgment and thought content normal.      Assessment & Plan:  1. Benign paroxysmal positional vertigo, unspecified laterality Neuro exam is reassuring today;  Exam and history are most consistent with BPPV; provided meclizine and placed urgent referral to ENT for further evaluation and treatment. - meclizine (ANTIVERT) 25 MG tablet; Take 1 tablet (25 mg total) by mouth 3 (three) times daily as needed for dizziness.  Dispense: 30 tablet; Refill: 0  2. Dizzy Orthostatic BPs checked and were WNL. No evidence of significant systolic or diastolic changes noted.  BP standing: 148/88, Sitting: 146/88, and Supine: 148/86. Symptom is most consistent with BPPV as stated above  3. Diabetes mellitus without complication (HCC) Nonfasting CBG in office: 211. Last A1C noted above as 6.9. He does not routinely check blood sugar at home but findings in exam and history make blood sugar less likely to be contributing to symptoms today. We discussed signs/symptoms of hypo/hyperglycemia and he also will follow up with PCP as scheduled with upcoming A1C and nutrition. We discussed while 211 is elevated this may be due to symptoms today. He has agreed to monitor diet and symptoms of hypo/hyperglycemia and follow up if he notices any changes or symptom of dizziness does not improve.  He is accompanied by a friend who will drive him home today however he is not experiencing dizziness while sitting in chair, concern for exacerbation of dizziness with movement is present and he will initiate meclizine and follow up with ENT. Return precautions provided; we discussed reasons for follow up which include worsening symptoms,  headache, chest pain, jaw pain, arm pain, numbness, tingling, SOB which require immediate medical attention.  Delano Metz, FNP-C

## 2018-03-21 NOTE — Patient Instructions (Signed)
A referral to ENT has been placed for you.  Please take medication as directed and follow up if symptoms do not improve with treatment, worsen, or new symptoms develop.  Please keep follow up appointment with Dr. Nicki Reaper as scheduled.    Vertigo Vertigo means that you feel like you are moving when you are not. Vertigo can also make you feel like things around you are moving when they are not. This feeling can come and go at any time. Vertigo often goes away on its own. Follow these instructions at home:  Avoid making fast movements.  Avoid driving.  Avoid using heavy machinery.  Avoid doing any task or activity that might cause danger to you or other people if you would have a vertigo attack while you are doing it.  Sit down right away if you feel dizzy or have trouble with your balance.  Take over-the-counter and prescription medicines only as told by your doctor.  Follow instructions from your doctor about which positions or movements you should avoid.  Drink enough fluid to keep your pee (urine) clear or pale yellow.  Keep all follow-up visits as told by your doctor. This is important. Contact a doctor if:  Medicine does not help your vertigo.  You have a fever.  Your problems get worse or you have new symptoms.  Your family or friends see changes in your behavior.  You feel sick to your stomach (nauseous) or you throw up (vomit).  You have a "pins and needles" feeling or you are numb in part of your body. Get help right away if:  You have trouble moving or talking.  You are always dizzy.  You pass out (faint).  You get very bad headaches.  You feel weak or have trouble using your hands, arms, or legs.  You have changes in your hearing.  You have changes in your seeing (vision).  You get a stiff neck.  Bright light starts to bother you. This information is not intended to replace advice given to you by your health care provider. Make sure you discuss any  questions you have with your health care provider. Document Released: 08/17/2008 Document Revised: 04/15/2016 Document Reviewed: 03/03/2015 Elsevier Interactive Patient Education  [employer].

## 2018-03-21 NOTE — Telephone Encounter (Signed)
Pt reports became dizzy lightheaded last night after he bent over. Feels like the room is spinning. Woozy pt is diabetic as well.  He is a diabetic has not checked his glucose today.He states he drank some orange juice.He is alert and oriented.Appoinyment was made today with Almira Coaster. Precautions given to patient. Patient was advised not to drive.  Reason for Disposition . [1] MODERATE dizziness (e.g., interferes with normal activities) AND [2] has NOT been evaluated by physician for this  (Exception: dizziness caused by heat exposure, sudden standing, or poor fluid intake)  Answer Assessment - Initial Assessment Questions 1. DESCRIPTION: "Describe your dizziness."       Lightheaded   2. LIGHTHEADED: "Do you feel lightheaded?" (e.g., somewhat faint, woozy, weak upon standing)        Woozy  3. VERTIGO: "Do you feel like either you or the room is spinning or tilting?" (i.e. vertigo)      Off  Balance   4. SEVERITY: "How bad is it?"  "Do you feel like you are going to faint?" "Can you stand and walk?"   - MILD - walking normally   - MODERATE - interferes with normal activities (e.g., work, school)    - SEVERE - unable to stand, requires support to walk, feels like passing out now.        Moderate   5. ONSET:  "When did the dizziness begin?"        Last  Night  After  Bending  Over   6. AGGRAVATING FACTORS: "Does anything make it worse?" (e.g., standing, change in head position)     Changing in head position  7. HEART RATE: "Can you tell me your heart rate?" "How many beats in 15 seconds?"  (Note: not all patients can do this)       Not  Able  To  Count   8. CAUSE: "What do you think is causing the dizziness?"      Worse- when  Bends  Over    9. RECURRENT SYMPTOM: "Have you had dizziness before?" If so, ask: "When was the last time?" "What happened that time?"        No   10. OTHER SYMPTOMS: "Do you have any other symptoms?" (e.g., fever, chest pain, vomiting, diarrhea, bleeding)    Nasal  Congestion      11. PREGNANCY: "Is there any Sacra you are pregnant?" "When was your last menstrual period?"       n/a  Protocols used: DIZZINESS [facility]

## 2018-04-10 LAB — HM DIABETES EYE EXAM

## 2018-04-24 ENCOUNTER — Encounter: Payer: Self-pay | Admitting: Internal Medicine

## 2018-04-24 ENCOUNTER — Ambulatory Visit: Payer: Managed Care, Other (non HMO) | Admitting: Internal Medicine

## 2018-04-24 DIAGNOSIS — L97809 Non-pressure chronic ulcer of other part of unspecified lower leg with unspecified severity: Secondary | ICD-10-CM

## 2018-04-24 DIAGNOSIS — D649 Anemia, unspecified: Secondary | ICD-10-CM | POA: Diagnosis not present

## 2018-04-24 DIAGNOSIS — R600 Localized edema: Secondary | ICD-10-CM | POA: Diagnosis not present

## 2018-04-24 DIAGNOSIS — I872 Venous insufficiency (chronic) (peripheral): Secondary | ICD-10-CM

## 2018-04-24 DIAGNOSIS — Z6841 Body Mass Index (BMI) 40.0 and over, adult: Secondary | ICD-10-CM | POA: Diagnosis not present

## 2018-04-24 DIAGNOSIS — E559 Vitamin D deficiency, unspecified: Secondary | ICD-10-CM | POA: Diagnosis not present

## 2018-04-24 DIAGNOSIS — E7800 Pure hypercholesterolemia, unspecified: Secondary | ICD-10-CM | POA: Diagnosis not present

## 2018-04-24 DIAGNOSIS — I83008 Varicose veins of unspecified lower extremity with ulcer other part of lower leg: Secondary | ICD-10-CM

## 2018-04-24 DIAGNOSIS — E1351 Other specified diabetes mellitus with diabetic peripheral angiopathy without gangrene: Secondary | ICD-10-CM

## 2018-04-24 DIAGNOSIS — I10 Essential (primary) hypertension: Secondary | ICD-10-CM

## 2018-04-24 MED ORDER — HYDROCHLOROTHIAZIDE 12.5 MG PO CAPS
12.5000 mg | ORAL_CAPSULE | Freq: Every day | ORAL | 2 refills | Status: DC
Start: 2018-04-24 — End: 2018-08-01

## 2018-04-24 NOTE — Progress Notes (Signed)
[name], male   DOB: Jul 27, 1961, 57 y.o.   MRN: [id number]   Subjective:    [name], male    DOB: Aug 07, 1961, 57 y.o.   MRN: [id number]  HPI  Patient here for a scheduled follow up.  He has been followed at wound clinic.  Leg swelling has improved.  The lesions/wounds improved.  His dizziness has also resolved.  Did see ENT.  Note reviewed.  He was doing better at his visit, so he wanted to hold on further testing.  Overall he feels he is doing better.  Has adjusted his diet.   No chest pain.  Breathing stable.  No acid reflux. Reproted.  No abdominal pain.  Bowels moving.  No urine change.  I had started him on hctz. He stopped when first rx ran out and did not get refill.  Blood pressure elevated today.  Appeared to be doing better on the hctz.  Remains off amlodipine.  Had eye exam two weeks ago.  No retinopathy.  a1c elevated recently - 6.9.  Going to [facility].  Discussed low carb diet and exercise.     Past Medical History:  Diagnosis Date  . 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 Depression   . Environmental allergies   . Family history of colon cancer   . GERD (gastroesophageal reflux disease)   . History of chicken pox   . Hyperlipidemia   . Hypertension   . Venous stasis   . Vitamin D deficiency    Past Surgical History:  Procedure Laterality Date  . CHOLECYSTECTOMY  2002  . COLONOSCOPY WITH PROPOFOL N/A 06/27/2015   Procedure: COLONOSCOPY WITH PROPOFOL;  Surgeon: Lollie Sails, MD;  Location: [facility] ENDOSCOPY;  Service: Endoscopy;  Laterality: N/A;  . HERNIA REPAIR  1962   Family History  Problem Relation Age of Onset  . Stomach cancer Mother   . Arthritis Mother   . Diabetes Mother   . Hypertension Mother   . Hyperlipidemia Father   . Heart disease Father   . Diabetes Father   . Hypertension Father   . Arthritis Maternal Grandmother   .  Hyperlipidemia Maternal Grandmother   . Hypertension Maternal Grandmother   . Arthritis Maternal Grandfather   . Hyperlipidemia Maternal Grandfather   . Heart disease Maternal Grandfather   . Hypertension Maternal Grandfather   . Arthritis Paternal Grandmother   . Breast cancer Paternal Grandmother   . Hypertension Paternal Grandmother   . Arthritis Paternal Grandfather   . Hyperlipidemia Paternal Grandfather   . Heart disease Paternal Grandfather   . Diabetes Paternal Grandfather   . Hypertension Paternal Grandfather   . Diabetes Sister    Social History   Socioeconomic History  . Marital status: Widowed    Spouse name: Not on file  . Number of children: Not on file  . Years of education: Not on file  . Highest education level: Not on file  Occupational History  . Not on file  Social Needs  . Financial resource strain: Not on file  . Food insecurity:    Worry: Not on file    Inability: Not on file  . Transportation needs:    Medical: Not on file    Non-medical: Not on file  Tobacco Use  . Smoking status: Never Smoker  . Smokeless tobacco: Never Used  Substance and Sexual Activity  .  Alcohol use: Yes    Alcohol/week: 0.0 - 0.6 oz    Comment: 1 drink every 2 months  . Drug use: No  . Sexual activity: Not on file  Lifestyle  . Physical activity:    Days per week: Not on file    Minutes per session: Not on file  . Stress: Not on file  Relationships  . Social connections:    Talks on phone: Not on file    Gets together: Not on file    Attends religious service: Not on file    Active member of club or organization: Not on file    Attends meetings of clubs or organizations: Not on file    Relationship status: Not on file  Other Topics Concern  . Not on file  Social History Narrative  . Not on file    Outpatient Encounter Medications as of 04/24/2018  Medication Sig  . hydrochlorothiazide (MICROZIDE) 12.5 MG capsule Take 1 capsule (12.5 mg total) by mouth daily.   Marland Kitchen lisinopril (PRINIVIL,ZESTRIL) 40 MG tablet Take 1 tablet (40 mg total) by mouth daily.  Marland Kitchen loratadine (CLARITIN) 10 MG tablet Take 10 mg by mouth daily as needed for allergies.  . ranitidine (ZANTAC) 150 MG tablet Take 150 mg by mouth at bedtime as needed for heartburn.  . rosuvastatin (CRESTOR) 10 MG tablet TAKE 1 TABLET BY MOUTH  DAILY  . Vitamin D, Ergocalciferol, (DRISDOL) 50000 units CAPS capsule Take 1 capsule (50,000 Units total) by mouth every 7 (seven) days.  . [DISCONTINUED] hydrochlorothiazide (MICROZIDE) 12.5 MG capsule Take 1 capsule (12.5 mg total) by mouth daily.  . meclizine (ANTIVERT) 25 MG tablet Take 1 tablet (25 mg total) by mouth 3 (three) times daily as needed for dizziness. (Patient not taking: Reported on 04/24/2018)   No facility-administered encounter medications on file as of 04/24/2018.     Review of Systems  Constitutional: Negative for appetite change and unexpected weight change.  HENT: Negative for congestion and sinus pressure.   Respiratory: Negative for cough and chest tightness.        Breathing stable.    Cardiovascular: Positive for leg swelling. Negative for chest pain and palpitations.       Leg swelling has improved.   Gastrointestinal: Negative for abdominal pain, diarrhea, nausea and vomiting.  Genitourinary: Negative for difficulty urinating and dysuria.  Musculoskeletal: Negative for joint swelling and myalgias.  Skin:       Lesions/wound on legs - improved.    Neurological: Negative for dizziness, light-headedness and headaches.  Psychiatric/Behavioral: Negative for agitation and dysphoric mood.       Objective:    Physical Exam  Constitutional: He appears well-developed and well-nourished. No distress.  HENT:  Nose: Nose normal.  Mouth/Throat: Oropharynx is clear and moist.  Neck: Neck supple. No thyromegaly present.  Cardiovascular: Normal rate and regular rhythm.  Pulmonary/Chest: Effort normal and breath sounds normal. No  respiratory distress.  Abdominal: Soft. Bowel sounds are normal. There is no tenderness.  Musculoskeletal:  Still with some pedal and lower extremity edema - improved.    Lymphadenopathy:    He has no cervical adenopathy.  Skin:  No increased erythema - lower extremities.  Lesions/wounds healing.    Psychiatric: He has a normal mood and affect. His behavior is normal.    BP (!) 162/70 (BP Location: Left Arm, Patient Position: Sitting, Cuff Size: Normal)   Pulse 73   Temp 98.9 F (37.2 C) (Oral)   Resp 18   Wt Marland Kitchen)  320 lb 6.4 oz (145.3 kg)   SpO2 96%   BMI 42.27 kg/m  Wt Readings from Last 3 Encounters:  04/24/18 (!) 320 lb 6.4 oz (145.3 kg)  03/21/18 (!) 321 lb 4 oz (145.7 kg)  03/15/18 (!) 318 lb 11.2 oz (144.6 kg)     Lab Results  Component Value Date   WBC 6.9 02/24/2017   HGB 14.0 02/24/2017   HCT 43.0 02/24/2017   PLT 231 02/24/2017   GLUCOSE 168 (H) 01/13/2018   CHOL 147 01/13/2018   TRIG 90 01/13/2018   HDL 46 01/13/2018   LDLCALC 83 01/13/2018   ALT 46 (H) 01/13/2018   AST 28 01/13/2018   NA 142 01/13/2018   K 3.8 01/13/2018   CL 103 01/13/2018   CREATININE 0.80 01/13/2018   BUN 17 01/13/2018   CO2 23 01/13/2018   TSH 1.630 02/24/2017   HGBA1C 6.9 (H) 01/13/2018    Dg Lumbar Spine 2-3 Views  Result Date: 09/17/2015 CLINICAL DATA:  Lower back pain. EXAM: LUMBAR SPINE - 2-3 VIEW COMPARISON:  None. FINDINGS: Multilevel spondylosis noted throughout the visualized spine. Moderate proliferative changes are present at T12-L1 and L1-L2. There is suggestion of a minimal retrolisthesis of L2 on L3 of approximately 4-5 mm. Mild proliferative changes are present at L3-4. Moderate facet hypertrophy present at L4-5 and L5-S1. No acute fracture identified. No bony lesions are seen. 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 There is mild retrolisthesis of L2 on L3 by approximately 4-5 mm. Electronically Signed   By: Aletta Edouard M.D.    On: 09/17/2015 09:02       Assessment & Plan:   Problem List Items Addressed This Visit    Anemia    Follow cbc.       BMI 40.0-44.9, adult (HCC)    Discussed diet and exercise.  Follow.       Chronic venous insufficiency    Has chronic venous insufficiency.  Being followed at the wound clinic.  Swelling improved.  Follow.        Relevant Medications   hydrochlorothiazide (MICROZIDE) 12.5 MG capsule   Essential hypertension    Blood pressure back up.  He is off hctz.  Restart.  Get him back in soon to reassess. Check metabolic panel.  Remain off amlodipine.        Relevant Medications   hydrochlorothiazide (MICROZIDE) 12.5 MG capsule   Hypercholesterolemia    On crestor.  Low cholesterol diet and exercise.  Follow lipid panel and liver function tests.        Relevant Medications   hydrochlorothiazide (MICROZIDE) 12.5 MG capsule   Lower extremity edema    Being followed at wound clinic.  Improved.  Follow.       Secondary diabetes with peripheral circulatory disorder ([place])    Going to [facility].  Discussed low carb diet and exercise.  Follow met b and a1c.  Up to date with eye checks.        Relevant Medications   hydrochlorothiazide (MICROZIDE) 12.5 MG capsule   Venous stasis ulcer ([place])    Being followed at wound clinic. Improving.        Vitamin D deficiency    Follow vitamin D level.           [name], MD

## 2018-04-27 ENCOUNTER — Encounter: Payer: Self-pay | Admitting: Internal Medicine

## 2018-04-27 NOTE — Assessment & Plan Note (Signed)
Follow vitamin D level.  

## 2018-04-27 NOTE — Assessment & Plan Note (Signed)
Follow cbc.  

## 2018-04-27 NOTE — Assessment & Plan Note (Signed)
Going to [facility].  Discussed low carb diet and exercise.  Follow met b and a1c.  Up to date with eye checks.

## 2018-04-27 NOTE — Assessment & Plan Note (Signed)
Has chronic venous insufficiency.  Being followed at the wound clinic.  Swelling improved.  Follow.

## 2018-04-27 NOTE — Assessment & Plan Note (Signed)
Discussed diet and exercise.  Follow.  

## 2018-04-27 NOTE — Assessment & Plan Note (Signed)
On crestor.  Low cholesterol diet and exercise.  Follow lipid panel and liver function tests.   

## 2018-04-27 NOTE — Assessment & Plan Note (Signed)
Blood pressure back up.  He is off hctz.  Restart.  Get him back in soon to reassess. Check metabolic panel.  Remain off amlodipine.

## 2018-04-27 NOTE — Assessment & Plan Note (Signed)
Being followed at wound clinic. Improving.

## 2018-04-27 NOTE — Assessment & Plan Note (Signed)
Being followed at wound clinic.  Improved.  Follow.

## 2018-04-29 LAB — CBC WITH DIFFERENTIAL/PLATELET
BASOS ABS: 0 10*3/uL (ref 0.0–0.2)
BASOS: 0 %
EOS (ABSOLUTE): 0.2 10*3/uL (ref 0.0–0.4)
Eos: 3 %
Hematocrit: 42.3 % (ref 37.5–51.0)
Hemoglobin: 14.4 g/dL (ref 13.0–17.7)
Immature Grans (Abs): 0 10*3/uL (ref 0.0–0.1)
Immature Granulocytes: 0 %
LYMPHS ABS: 1.5 10*3/uL (ref 0.7–3.1)
LYMPHS: 25 %
MCH: 30.1 pg (ref 26.6–33.0)
MCHC: 34 g/dL (ref 31.5–35.7)
MCV: 88 fL (ref 79–97)
MONOS ABS: 0.5 10*3/uL (ref 0.1–0.9)
Monocytes: 9 %
NEUTROS ABS: 3.6 10*3/uL (ref 1.4–7.0)
Neutrophils: 63 %
PLATELETS: 246 10*3/uL (ref 150–450)
RBC: 4.79 x10E6/uL (ref 4.14–5.80)
RDW: 13.1 % (ref 12.3–15.4)
WBC: 5.7 10*3/uL (ref 3.4–10.8)

## 2018-04-29 LAB — HEMOGLOBIN A1C
Est. average glucose Bld gHb Est-mCnc: 157 mg/dL
HEMOGLOBIN A1C: 7.1 % — AB (ref 4.8–5.6)

## 2018-04-29 LAB — VITAMIN B12: VITAMIN B 12: 394 pg/mL (ref 232–1245)

## 2018-04-29 LAB — LIPID PANEL
CHOL/HDL RATIO: 4.5 ratio (ref 0.0–5.0)
CHOLESTEROL TOTAL: 156 mg/dL (ref 100–199)
HDL: 35 mg/dL — AB (ref >39)
LDL Calculated: 103 mg/dL — ABNORMAL HIGH (ref 0–99)
TRIGLYCERIDES: 88 mg/dL (ref 0–149)
VLDL Cholesterol Cal: 18 mg/dL (ref 5–40)

## 2018-04-29 LAB — HEPATIC FUNCTION PANEL
ALBUMIN: 3.9 g/dL (ref 3.5–5.5)
ALT: 18 IU/L (ref 0–44)
AST: 13 IU/L (ref 0–40)
Alkaline Phosphatase: 70 IU/L (ref 39–117)
Bilirubin Total: 0.4 mg/dL (ref 0.0–1.2)
Bilirubin, Direct: 0.12 mg/dL (ref 0.00–0.40)
Total Protein: 6.8 g/dL (ref 6.0–8.5)

## 2018-04-29 LAB — BASIC METABOLIC PANEL
BUN/Creatinine Ratio: 23 — ABNORMAL HIGH (ref 9–20)
BUN: 20 mg/dL (ref 6–24)
CALCIUM: 9.2 mg/dL (ref 8.7–10.2)
CO2: 26 mmol/L (ref 20–29)
CREATININE: 0.86 mg/dL (ref 0.76–1.27)
Chloride: 101 mmol/L (ref 96–106)
GFR calc Af Amer: 112 mL/min/{1.73_m2} (ref >59)
GFR calc non Af Amer: 97 mL/min/{1.73_m2} (ref >59)
Glucose: 178 mg/dL — ABNORMAL HIGH (ref 65–99)
POTASSIUM: 3.5 mmol/L (ref 3.5–5.2)
SODIUM: 141 mmol/L (ref 134–144)

## 2018-04-29 LAB — TSH: TSH: 1.25 u[IU]/mL (ref 0.450–4.500)

## 2018-05-01 ENCOUNTER — Other Ambulatory Visit: Payer: Self-pay

## 2018-05-01 MED ORDER — METFORMIN HCL 500 MG PO TABS: 500.0000 mg | ORAL_TABLET | Freq: Two times a day (BID) | ORAL | 3 refills | Status: DC

## 2018-05-01 MED ORDER — ROSUVASTATIN CALCIUM 10 MG PO TABS: 10.0000 mg | ORAL_TABLET | Freq: Every day | ORAL | 1 refills | Status: DC

## 2018-05-11 ENCOUNTER — Telehealth: Payer: Self-pay | Admitting: Internal Medicine

## 2018-05-11 ENCOUNTER — Other Ambulatory Visit: Payer: Self-pay | Admitting: Internal Medicine

## 2018-05-11 NOTE — Telephone Encounter (Signed)
Copied from [place] [id number]. Topic: Quick Communication - Rx Refill/Question >> May 11, 2018 11:23 AM Boyd Kerbs wrote: Medication:   lisinopril (PRINIVIL,ZESTRIL) 40 MG tablet He has 2 left  Has the patient contacted their pharmacy? Yes.   (Agent: If no, request that the patient contact the pharmacy for the refill.) (Agent: If yes, when and what did the pharmacy advise?)  Preferred Pharmacy (with phone number or street name):   [place], [place] [facility] [address] [place] [address] [place] [address] Phone: [phone] Fax: [phone]    Agent: Please be advised that RX refills may take up to 3 business days. We ask that you follow-up with your pharmacy.

## 2018-06-30 ENCOUNTER — Encounter: Payer: Self-pay | Admitting: Internal Medicine

## 2018-06-30 ENCOUNTER — Ambulatory Visit: Payer: Managed Care, Other (non HMO) | Admitting: Internal Medicine

## 2018-06-30 DIAGNOSIS — I10 Essential (primary) hypertension: Secondary | ICD-10-CM

## 2018-06-30 DIAGNOSIS — E559 Vitamin D deficiency, unspecified: Secondary | ICD-10-CM

## 2018-06-30 DIAGNOSIS — E1351 Other specified diabetes mellitus with diabetic peripheral angiopathy without gangrene: Secondary | ICD-10-CM

## 2018-06-30 DIAGNOSIS — D649 Anemia, unspecified: Secondary | ICD-10-CM

## 2018-06-30 DIAGNOSIS — E7800 Pure hypercholesterolemia, unspecified: Secondary | ICD-10-CM

## 2018-06-30 DIAGNOSIS — K219 Gastro-esophageal reflux disease without esophagitis: Secondary | ICD-10-CM | POA: Diagnosis not present

## 2018-06-30 DIAGNOSIS — I872 Venous insufficiency (chronic) (peripheral): Secondary | ICD-10-CM

## 2018-06-30 DIAGNOSIS — R600 Localized edema: Secondary | ICD-10-CM

## 2018-06-30 NOTE — Progress Notes (Signed)
Patient ID: LADISLAUS [name], male   DOB: Nov 17, 1961, 57 y.o.   MRN: [id number]   Subjective:    [name], male    DOB: June 03, 1961, 57 y.o.   MRN: [id number]  HPI  Patient here for a scheduled follow up.  He reports he feels better.  Is walking more.  Feels more flexible.  Has not lost weight.  Discussed with him today. He has been trying to watch what he eats.  Legs better.  Swelling better.  Skin lesions/wound better.  No chest pain.  Breathing stable.  No acid reflux.  No abdominal pain.  Bowels moving.     Past Medical History:  Diagnosis Date  . 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 Depression   . Environmental allergies   . Family history of colon cancer   . GERD (gastroesophageal reflux disease)   . History of chicken pox   . Hyperlipidemia   . Hypertension   . Venous stasis   . Vitamin D deficiency    Past Surgical History:  Procedure Laterality Date  . CHOLECYSTECTOMY  2002  . COLONOSCOPY WITH PROPOFOL N/A 06/27/2015   Procedure: COLONOSCOPY WITH PROPOFOL;  Surgeon: Lollie Sails, MD;  Location: [facility] ENDOSCOPY;  Service: Endoscopy;  Laterality: N/A;  . HERNIA REPAIR  1962   Family History  Problem Relation Age of Onset  . Stomach cancer Mother   . Arthritis Mother   . Diabetes Mother   . Hypertension Mother   . Hyperlipidemia Father   . Heart disease Father   . Diabetes Father   . Hypertension Father   . Arthritis Maternal Grandmother   . Hyperlipidemia Maternal Grandmother   . Hypertension Maternal Grandmother   . Arthritis Maternal Grandfather   . Hyperlipidemia Maternal Grandfather   . Heart disease Maternal Grandfather   . Hypertension Maternal Grandfather   . Arthritis Paternal Grandmother   . Breast cancer Paternal Grandmother   . Hypertension Paternal Grandmother   . Arthritis Paternal Grandfather   . Hyperlipidemia Paternal Grandfather   .  Heart disease Paternal Grandfather   . Diabetes Paternal Grandfather   . Hypertension Paternal Grandfather   . Diabetes Sister    Social History   Socioeconomic History  . Marital status: Widowed    Spouse name: Not on file  . Number of children: Not on file  . Years of education: Not on file  . Highest education level: Not on file  Occupational History  . Not on file  Social Needs  . Financial resource strain: Not on file  . Food insecurity:    Worry: Not on file    Inability: Not on file  . Transportation needs:    Medical: Not on file    Non-medical: Not on file  Tobacco Use  . Smoking status: Never Smoker  . Smokeless tobacco: Never Used  Substance and Sexual Activity  . Alcohol use: Yes    Alcohol/week: 0.0 - 1.0 standard drinks    Comment: 1 drink every 2 months  . Drug use: No  . Sexual activity: Not on file  Lifestyle  . Physical activity:    Days per week: Not on file    Minutes per session: Not on file  . Stress: Not on file  Relationships  . Social connections:    Talks on phone: Not on file    Gets together: Not on file  Attends religious service: Not on file    Active member of club or organization: Not on file    Attends meetings of clubs or organizations: Not on file    Relationship status: Not on file  Other Topics Concern  . Not on file  Social History Narrative  . Not on file    Outpatient Encounter Medications as of 06/30/2018  Medication Sig  . cholecalciferol (VITAMIN D) 1000 units tablet Take 1,000 Units by mouth daily.  . hydrochlorothiazide (MICROZIDE) 12.5 MG capsule Take 1 capsule (12.5 mg total) by mouth daily.  Marland Kitchen lisinopril (PRINIVIL,ZESTRIL) 40 MG tablet TAKE 1 TABLET BY MOUTH  DAILY  . loratadine (CLARITIN) 10 MG tablet Take 10 mg by mouth daily as needed for allergies.  . metFORMIN (GLUCOPHAGE) 500 MG tablet Take 1 tablet (500 mg total) by mouth 2 (two) times daily with a meal.  . ranitidine (ZANTAC) 150 MG tablet Take 150 mg by  mouth at bedtime as needed for heartburn.  . rosuvastatin (CRESTOR) 10 MG tablet Take 1 tablet (10 mg total) by mouth daily.  . [DISCONTINUED] meclizine (ANTIVERT) 25 MG tablet Take 1 tablet (25 mg total) by mouth 3 (three) times daily as needed for dizziness. (Patient not taking: Reported on 04/24/2018)  . [DISCONTINUED] Vitamin D, Ergocalciferol, (DRISDOL) 50000 units CAPS capsule Take 1 capsule (50,000 Units total) by mouth every 7 (seven) days.   No facility-administered encounter medications on file as of 06/30/2018.     Review of Systems  Constitutional: Negative for appetite change and unexpected weight change.  HENT: Negative for congestion and sinus pressure.   Respiratory: Negative for cough, chest tightness and shortness of breath.   Cardiovascular: Negative for chest pain and palpitations.       Leg swelling improved.    Gastrointestinal: Negative for abdominal pain, diarrhea, nausea and vomiting.  Genitourinary: Negative for difficulty urinating and dysuria.  Musculoskeletal: Negative for joint swelling and myalgias.  Skin: Negative for color change and rash.  Neurological: Negative for dizziness, light-headedness and headaches.  Psychiatric/Behavioral: Negative for agitation and dysphoric mood.       Objective:     Blood pressure rechecked by me:  138/78  Physical Exam  Constitutional: He appears well-developed and well-nourished. No distress.  HENT:  Nose: Nose normal.  Mouth/Throat: Oropharynx is clear and moist.  Neck: Neck supple. No thyromegaly present.  Cardiovascular: Normal rate and regular rhythm.  Pulmonary/Chest: Effort normal and breath sounds normal. No respiratory distress.  Abdominal: Soft. Bowel sounds are normal. There is no tenderness.  Musculoskeletal: He exhibits no tenderness.  Lower extremity swelling - improved.  No increased erythema or warmth.    Lymphadenopathy:    He has no cervical adenopathy.  Skin: No rash noted. No erythema.    Psychiatric: He has a normal mood and affect. His behavior is normal.    BP 138/78   Pulse 80   Temp 98.3 F (36.8 C) (Oral)   Resp 18   Wt (!) 327 lb 6.4 oz (148.5 kg)   SpO2 97%   BMI 43.20 kg/m  Wt Readings from Last 3 Encounters:  06/30/18 (!) 327 lb 6.4 oz (148.5 kg)  04/24/18 (!) 320 lb 6.4 oz (145.3 kg)  03/21/18 (!) 321 lb 4 oz (145.7 kg)     Lab Results  Component Value Date   WBC 5.7 04/28/2018   HGB 14.4 04/28/2018   HCT 42.3 04/28/2018   PLT 246 04/28/2018   GLUCOSE 178 (H) 04/28/2018  CHOL 156 04/28/2018   TRIG 88 04/28/2018   HDL 35 (L) 04/28/2018   LDLCALC 103 (H) 04/28/2018   ALT 18 04/28/2018   AST 13 04/28/2018   NA 141 04/28/2018   K 3.5 04/28/2018   CL 101 04/28/2018   CREATININE 0.86 04/28/2018   BUN 20 04/28/2018   CO2 26 04/28/2018   TSH 1.250 04/28/2018   HGBA1C 7.1 (H) 04/28/2018    Dg Lumbar Spine 2-3 Views  Result Date: 09/17/2015 CLINICAL DATA:  Lower back pain. EXAM: LUMBAR SPINE - 2-3 VIEW COMPARISON:  None. FINDINGS: Multilevel spondylosis noted throughout the visualized spine. Moderate proliferative changes are present at T12-L1 and L1-L2. There is suggestion of a minimal retrolisthesis of L2 on L3 of approximately 4-5 mm. Mild proliferative changes are present at L3-4. Moderate facet hypertrophy present at L4-5 and L5-S1. No acute fracture identified. No bony lesions are seen. 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 There is mild retrolisthesis of L2 on L3 by approximately 4-5 mm. Electronically Signed   By: Aletta Edouard M.D.   On: 09/17/2015 09:02       Assessment & Plan:   Problem List Items Addressed This Visit    Anemia    Follow cbc.       Chronic venous insufficiency    Compression hose.  Swelling better.        Essential hypertension    Blood pressure doing better.  Continue current regimen.  Follow.        GERD (gastroesophageal reflux disease)    Controlled on current  regimen.  Follow.        Hypercholesterolemia    On crestor.  Low cholesterol diet and exercise.  Follow lipid panel and liver function tests.        Relevant Orders   Hepatic function panel   Lipid panel   Lower extremity edema    Swelling improved.  Compression hose.        Secondary diabetes with peripheral circulatory disorder (HCC)    Low carb diet and exercise.  Follow met b and a1c.        Relevant Orders   Hemoglobin C7G   Basic metabolic panel   Microalbumin / creatinine urine ratio   Vitamin D deficiency    Follow vitamin D level.            [name], MD

## 2018-07-03 ENCOUNTER — Encounter: Payer: Self-pay | Admitting: Internal Medicine

## 2018-07-03 NOTE — Assessment & Plan Note (Signed)
Follow vitamin D level.  

## 2018-07-03 NOTE — Assessment & Plan Note (Signed)
Low carb diet and exercise.  Follow met b and a1c.   

## 2018-07-03 NOTE — Assessment & Plan Note (Signed)
Compression hose.  Swelling better.

## 2018-07-03 NOTE — Assessment & Plan Note (Signed)
Swelling improved.  Compression hose.

## 2018-07-03 NOTE — Assessment & Plan Note (Signed)
Follow cbc.  

## 2018-07-03 NOTE — Assessment & Plan Note (Signed)
On crestor.  Low cholesterol diet and exercise.  Follow lipid panel and liver function tests.   

## 2018-07-03 NOTE — Assessment & Plan Note (Signed)
Blood pressure doing better.  Continue current regimen.  Follow.

## 2018-07-03 NOTE — Assessment & Plan Note (Signed)
Controlled on current regimen.  Follow.  

## 2018-07-31 ENCOUNTER — Telehealth: Payer: Self-pay

## 2018-07-31 NOTE — Telephone Encounter (Signed)
Copied from [place] [phone] [phone]. Topic: General - Other >> Jul 31, 2018  3:48 PM Carolyn Stare wrote:  Pt would like to know if he is to continue to take the below med and if so he need a refill    hydrochlorothiazide (MICROZIDE) 12.5 MG capsule  [place]

## 2018-08-01 ENCOUNTER — Other Ambulatory Visit: Payer: Self-pay

## 2018-08-01 ENCOUNTER — Encounter: Payer: Self-pay | Admitting: Internal Medicine

## 2018-08-01 MED ORDER — HYDROCHLOROTHIAZIDE 12.5 MG PO CAPS: 12.5000 mg | ORAL_CAPSULE | Freq: Every day | ORAL | 2 refills | Status: DC

## 2018-08-01 NOTE — Telephone Encounter (Signed)
Medication sent in to [facility]. My Chart message sent

## 2018-08-02 ENCOUNTER — Telehealth: Payer: Self-pay | Admitting: Internal Medicine

## 2018-08-02 NOTE — Telephone Encounter (Signed)
Form placed in quick sign 

## 2018-08-02 NOTE — Telephone Encounter (Signed)
See last unrouted message

## 2018-08-02 NOTE — Telephone Encounter (Signed)
Pt dropped off a form for Dr. Nicki Reaper to fill out. Form is up front in color folder.

## 2018-08-03 NOTE — Telephone Encounter (Signed)
Form placed up front

## 2018-08-03 NOTE — Telephone Encounter (Signed)
Form completed and placed in box.  

## 2018-09-18 ENCOUNTER — Other Ambulatory Visit: Payer: Self-pay | Admitting: Internal Medicine

## 2018-09-28 ENCOUNTER — Other Ambulatory Visit: Payer: Self-pay | Admitting: Internal Medicine

## 2018-10-26 ENCOUNTER — Encounter: Payer: Self-pay | Admitting: Internal Medicine

## 2018-10-27 ENCOUNTER — Other Ambulatory Visit: Payer: Self-pay | Admitting: Internal Medicine

## 2018-10-28 LAB — BASIC METABOLIC PANEL
BUN/Creatinine Ratio: 28 — ABNORMAL HIGH (ref 9–20)
BUN: 24 mg/dL (ref 6–24)
CO2: 29 mmol/L (ref 20–29)
CREATININE: 0.86 mg/dL (ref 0.76–1.27)
Calcium: 9.7 mg/dL (ref 8.7–10.2)
Chloride: 100 mmol/L (ref 96–106)
GFR calc Af Amer: 111 mL/min/{1.73_m2} (ref >59)
GFR, EST NON AFRICAN AMERICAN: 96 mL/min/{1.73_m2} (ref >59)
Glucose: 170 mg/dL — ABNORMAL HIGH (ref 65–99)
Potassium: 3.7 mmol/L (ref 3.5–5.2)
SODIUM: 142 mmol/L (ref 134–144)

## 2018-10-28 LAB — HEPATIC FUNCTION PANEL
ALT: 17 IU/L (ref 0–44)
AST: 14 IU/L (ref 0–40)
Albumin: 4 g/dL (ref 3.5–5.5)
Alkaline Phosphatase: 80 IU/L (ref 39–117)
Bilirubin Total: 0.3 mg/dL (ref 0.0–1.2)
Bilirubin, Direct: 0.11 mg/dL (ref 0.00–0.40)
Total Protein: 7 g/dL (ref 6.0–8.5)

## 2018-10-28 LAB — MICROALBUMIN / CREATININE URINE RATIO
Creatinine, Urine: 48.2 mg/dL
Microalb/Creat Ratio: 46.5 mg/g creat — ABNORMAL HIGH (ref 0.0–30.0)
Microalbumin, Urine: 22.4 ug/mL

## 2018-10-28 LAB — LIPID PANEL
CHOLESTEROL TOTAL: 141 mg/dL (ref 100–199)
Chol/HDL Ratio: 3.2 ratio (ref 0.0–5.0)
HDL: 44 mg/dL (ref >39)
LDL CALC: 74 mg/dL (ref 0–99)
TRIGLYCERIDES: 116 mg/dL (ref 0–149)
VLDL CHOLESTEROL CAL: 23 mg/dL (ref 5–40)

## 2018-10-28 LAB — HEMOGLOBIN A1C
Est. average glucose Bld gHb Est-mCnc: 157 mg/dL
Hgb A1c MFr Bld: 7.1 % — ABNORMAL HIGH (ref 4.8–5.6)

## 2018-10-30 ENCOUNTER — Encounter: Payer: Self-pay | Admitting: Internal Medicine

## 2018-10-30 ENCOUNTER — Ambulatory Visit: Payer: Managed Care, Other (non HMO) | Admitting: Internal Medicine

## 2018-10-30 VITALS — BP 146/90 | HR 75 | Temp 97.8°F | Resp 16 | Wt 317.0 lb

## 2018-10-30 DIAGNOSIS — R109 Unspecified abdominal pain: Secondary | ICD-10-CM

## 2018-10-30 DIAGNOSIS — E1351 Other specified diabetes mellitus with diabetic peripheral angiopathy without gangrene: Secondary | ICD-10-CM

## 2018-10-30 DIAGNOSIS — I872 Venous insufficiency (chronic) (peripheral): Secondary | ICD-10-CM | POA: Diagnosis not present

## 2018-10-30 DIAGNOSIS — Z6841 Body Mass Index (BMI) 40.0 and over, adult: Secondary | ICD-10-CM | POA: Diagnosis not present

## 2018-10-30 DIAGNOSIS — I10 Essential (primary) hypertension: Secondary | ICD-10-CM

## 2018-10-30 DIAGNOSIS — E7800 Pure hypercholesterolemia, unspecified: Secondary | ICD-10-CM

## 2018-10-30 DIAGNOSIS — R600 Localized edema: Secondary | ICD-10-CM

## 2018-10-30 DIAGNOSIS — Z23 Encounter for immunization: Secondary | ICD-10-CM | POA: Diagnosis not present

## 2018-10-30 DIAGNOSIS — E559 Vitamin D deficiency, unspecified: Secondary | ICD-10-CM

## 2018-10-30 DIAGNOSIS — D649 Anemia, unspecified: Secondary | ICD-10-CM

## 2018-10-30 DIAGNOSIS — L989 Disorder of the skin and subcutaneous tissue, unspecified: Secondary | ICD-10-CM

## 2018-10-30 MED ORDER — HYDROCHLOROTHIAZIDE 25 MG PO TABS: 25.0000 mg | ORAL_TABLET | Freq: Every day | ORAL | 2 refills | Status: DC

## 2018-10-30 NOTE — Progress Notes (Addendum)
[name], male   DOB: 04/12/1961, 57 y.o.   MRN: [id number]   Subjective:    [name], male    DOB: June 29, 1961, 57 y.o.   MRN: [id number]  HPI  Patient here for a scheduled follow up.  He recently returned from a trip [place].  Did a lot of walking.  Has lost weight.  Feels better.  No chest pain.  Breathing stable.  No acid reflux.  No abdominal pain.  Bowels moving.  Reports he went to get in a booth and the booth was narrow.  He hit his stomach up against the table.  Has noticed some soreness in her lower abdomen since.  Is getting better.  No nausea or vomiting.  Discussed labs. He is not taking his metformin bid.  Most days, only takes one time per day.  Forgets the second pill.  Plans to stay more active.  Discussed diet and exercise.     Past Medical History:  Diagnosis Date  . 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 Depression   . Environmental allergies   . Family history of colon cancer   . GERD (gastroesophageal reflux disease)   . History of chicken pox   . Hyperlipidemia   . Hypertension   . Venous stasis   . Vitamin D deficiency    Past Surgical History:  Procedure Laterality Date  . CHOLECYSTECTOMY  2002  . COLONOSCOPY WITH PROPOFOL N/A 06/27/2015   Procedure: COLONOSCOPY WITH PROPOFOL;  Surgeon: Lollie Sails, MD;  Location: [facility] ENDOSCOPY;  Service: Endoscopy;  Laterality: N/A;  . HERNIA REPAIR  1962   Family History  Problem Relation Age of Onset  . Stomach cancer Mother   . Arthritis Mother   . Diabetes Mother   . Hypertension Mother   . Hyperlipidemia Father   . Heart disease Father   . Diabetes Father   . Hypertension Father   . Arthritis Maternal Grandmother   . Hyperlipidemia Maternal Grandmother   . Hypertension Maternal Grandmother   . Arthritis Maternal Grandfather   . Hyperlipidemia Maternal Grandfather   . Heart disease  Maternal Grandfather   . Hypertension Maternal Grandfather   . Arthritis Paternal Grandmother   . Breast cancer Paternal Grandmother   . Hypertension Paternal Grandmother   . Arthritis Paternal Grandfather   . Hyperlipidemia Paternal Grandfather   . Heart disease Paternal Grandfather   . Diabetes Paternal Grandfather   . Hypertension Paternal Grandfather   . Diabetes Sister    Social History   Socioeconomic History  . Marital status: Widowed    Spouse name: Not on file  . Number of children: Not on file  . Years of education: Not on file  . Highest education level: Not on file  Occupational History  . Not on file  Social Needs  . Financial resource strain: Not on file  . Food insecurity:    Worry: Not on file    Inability: Not on file  . Transportation needs:    Medical: Not on file    Non-medical: Not on file  Tobacco Use  . Smoking status: Never Smoker  . Smokeless tobacco: Never Used  Substance and Sexual Activity  . Alcohol use: Yes    Alcohol/week: 0.0 - 1.0 standard drinks    Comment: 1 drink every 2 months  . Drug use: No  . Sexual activity: Not  on file  Lifestyle  . Physical activity:    Days per week: Not on file    Minutes per session: Not on file  . Stress: Not on file  Relationships  . Social connections:    Talks on phone: Not on file    Gets together: Not on file    Attends religious service: Not on file    Active member of club or organization: Not on file    Attends meetings of clubs or organizations: Not on file    Relationship status: Not on file  Other Topics Concern  . Not on file  Social History Narrative  . Not on file    Outpatient Encounter Medications as of 10/30/2018  Medication Sig  . cholecalciferol (VITAMIN D) 1000 units tablet Take 1,000 Units by mouth daily.  . hydrochlorothiazide (HYDRODIURIL) 25 MG tablet Take 1 tablet (25 mg total) by mouth daily.  Marland Kitchen lisinopril (PRINIVIL,ZESTRIL) 40 MG tablet TAKE 1 TABLET BY MOUTH  DAILY    . loratadine (CLARITIN) 10 MG tablet Take 10 mg by mouth daily as needed for allergies.  . metFORMIN (GLUCOPHAGE) 500 MG tablet Take 1 tablet (500 mg total) by mouth 2 (two) times daily with a meal.  . mupirocin ointment (BACTROBAN) 2 % Apply to affected area on leg bid  . ranitidine (ZANTAC) 150 MG tablet Take 150 mg by mouth at bedtime as needed for heartburn.  . rosuvastatin (CRESTOR) 10 MG tablet TAKE 1 TABLET BY MOUTH  DAILY  . [DISCONTINUED] hydrochlorothiazide (MICROZIDE) 12.5 MG capsule TAKE 1 CAPSULE(12.5 MG) BY MOUTH DAILY   No facility-administered encounter medications on file as of 10/30/2018.     Review of Systems  Constitutional: Negative for appetite change and unexpected weight change.  HENT: Negative for congestion and sinus pressure.   Respiratory: Negative for cough, chest tightness and shortness of breath.   Cardiovascular: Negative for chest pain and palpitations.       Leg swelling has improved.    Gastrointestinal: Negative for diarrhea and nausea.       Lower abdominal discomfort as outlined.    Genitourinary: Negative for difficulty urinating and dysuria.  Musculoskeletal: Negative for joint swelling and myalgias.  Skin: Negative for color change and rash.       Stasis changes - lower extremities.    Neurological: Negative for dizziness, light-headedness and headaches.  Psychiatric/Behavioral: Negative for agitation and dysphoric mood.       Objective:    Physical Exam Constitutional:      General: He is not in acute distress.    Appearance: Normal appearance. He is well-developed.  HENT:     Nose: Nose normal. No congestion.     Mouth/Throat:     Pharynx: No oropharyngeal exudate or posterior oropharyngeal erythema.  Neck:     Musculoskeletal: Neck supple. No muscular tenderness.  Cardiovascular:     Rate and Rhythm: Normal rate and regular rhythm.  Pulmonary:     Effort: Pulmonary effort is normal. No respiratory distress.     Breath sounds:  Normal breath sounds.  Abdominal:     General: Bowel sounds are normal.     Palpations: Abdomen is soft.     Tenderness: There is no abdominal tenderness.  Musculoskeletal:     Comments: Pedal and lower extremity swelling improved.    Lymphadenopathy:     Cervical: No cervical adenopathy.  Skin:    Comments: Stasis changes noted - lower extremities.  One small open area.  No increased  erythema.    Neurological:     Mental Status: He is alert.  Psychiatric:        Mood and Affect: Mood normal.        Behavior: Behavior normal.     BP (!) 146/90 (BP Location: Left Arm, Patient Position: Sitting, Cuff Size: Normal)   Pulse 75   Temp 97.8 F (36.6 C) (Oral)   Resp 16   Wt (!) 317 lb (143.8 kg)   SpO2 96%   BMI 41.82 kg/m  Wt Readings from Last 3 Encounters:  10/30/18 (!) 317 lb (143.8 kg)  06/30/18 (!) 327 lb 6.4 oz (148.5 kg)  04/24/18 (!) 320 lb 6.4 oz (145.3 kg)     Lab Results  Component Value Date   WBC 5.7 04/28/2018   HGB 14.4 04/28/2018   HCT 42.3 04/28/2018   PLT 246 04/28/2018   GLUCOSE 170 (H) 10/27/2018   CHOL 141 10/27/2018   TRIG 116 10/27/2018   HDL 44 10/27/2018   LDLCALC 74 10/27/2018   ALT 17 10/27/2018   AST 14 10/27/2018   NA 142 10/27/2018   K 3.7 10/27/2018   CL 100 10/27/2018   CREATININE 0.86 10/27/2018   BUN 24 10/27/2018   CO2 29 10/27/2018   TSH 1.250 04/28/2018   HGBA1C 7.1 (H) 10/27/2018       Assessment & Plan:   Problem List Items Addressed This Visit    Anemia    Follow cbc.       BMI 40.0-44.9, adult ([place])    Has lost weight.  Has been more active.  Discussed diet and exercise.  Follow.        Chronic venous insufficiency    Compression hose.  Swelling better.  bactroban topically.  Follow.        Relevant Medications   hydrochlorothiazide (HYDRODIURIL) 25 MG tablet   Essential hypertension    Blood pressure elevated. Change hctz to 8m q day. Follow pressures.  Follow metabolic panel.  Get him back in soon to  reassess.        Relevant Medications   hydrochlorothiazide (HYDRODIURIL) 25 MG tablet   Other Relevant Orders   Basic metabolic panel   Hypercholesterolemia    On crestor.  Low cholesterol diet and exercise.  Follow lipid panel and liver function tests.        Relevant Medications   hydrochlorothiazide (HYDRODIURIL) 25 MG tablet   Lower extremity edema    Swelling improved.  Continue compression hose.        Secondary diabetes with peripheral circulatory disorder (HCC)    Low carb diet and exercise.  Recent a1c 7.1.  Not taking the metformin bid.  Discussed importance of taking his medications on a regular basis.  Follow met b and a1c.       Relevant Medications   hydrochlorothiazide (HYDRODIURIL) 25 MG tablet   Vitamin D deficiency    Follow vitamin D level.         Other Visit Diagnoses    Abdominal pain, unspecified abdominal location    -  Primary   He hit his abdomen against the booth.  Better.  Follow.  No significant tenderness or abnormality noted on exam.     Leg lesion       Apply bactroan topically. Follow. Notify me if any worsening or if does not heal.         CEinar Pheasant MD

## 2018-11-02 ENCOUNTER — Encounter: Payer: Self-pay | Admitting: Internal Medicine

## 2018-11-02 MED ORDER — MUPIROCIN 2 % EX OINT: TOPICAL_OINTMENT | CUTANEOUS | 0 refills | Status: DC

## 2018-11-02 NOTE — Assessment & Plan Note (Signed)
Follow vitamin D level.  

## 2018-11-02 NOTE — Assessment & Plan Note (Signed)
Has lost weight.  Has been more active.  Discussed diet and exercise.  Follow.

## 2018-11-02 NOTE — Assessment & Plan Note (Signed)
Blood pressure elevated. Change hctz to 25mg  q day. Follow pressures.  Follow metabolic panel.  Get him back in soon to reassess.

## 2018-11-02 NOTE — Assessment & Plan Note (Signed)
Follow cbc.  

## 2018-11-02 NOTE — Assessment & Plan Note (Signed)
Swelling improved.  Continue compression hose.

## 2018-11-02 NOTE — Addendum Note (Signed)
Addended by: Alisa Graff on: 11/02/2018 05:50 AM   Modules accepted: Orders

## 2018-11-02 NOTE — Assessment & Plan Note (Signed)
On crestor.  Low cholesterol diet and exercise.  Follow lipid panel and liver function tests.   

## 2018-11-02 NOTE — Assessment & Plan Note (Signed)
Compression hose.  Swelling better.  bactroban topically.  Follow.

## 2018-11-02 NOTE — Assessment & Plan Note (Signed)
Low carb diet and exercise.  Recent a1c 7.1.  Not taking the metformin bid.  Discussed importance of taking his medications on a regular basis.  Follow met b and a1c.

## 2018-12-18 ENCOUNTER — Telehealth: Payer: Self-pay

## 2018-12-18 ENCOUNTER — Encounter: Payer: Self-pay | Admitting: Podiatry

## 2018-12-18 ENCOUNTER — Ambulatory Visit: Payer: Managed Care, Other (non HMO) | Admitting: Podiatry

## 2018-12-18 VITALS — BP 161/91 | HR 65

## 2018-12-18 DIAGNOSIS — L603 Nail dystrophy: Secondary | ICD-10-CM

## 2018-12-18 DIAGNOSIS — I10 Essential (primary) hypertension: Secondary | ICD-10-CM

## 2018-12-18 DIAGNOSIS — T148XXA Other injury of unspecified body region, initial encounter: Secondary | ICD-10-CM

## 2018-12-18 NOTE — Telephone Encounter (Signed)
Pt called to let me know that he was at [facility] trying to get his labs drawn but they did not have orders. Reordered BMP, had Dr. Derrel Nip sign and faxed to [place] is aware. Advised to let me know if they did not receive.

## 2018-12-18 NOTE — Progress Notes (Signed)
This patient presents to the office with chief complaint of long thick unattached  nail right big tope  and diabetic feet.  This patient  says there  is  no pain and discomfort in their feet.  This patient says there are long thick unattached nail right foot.  Patient says the nail became thick  Unattached and blackened 2 months ago..  These nails are painful walking and wearing shoes.  Patient has no history of infection or drainage from both feet.  Patient is unable to  self treat his own nails . This patient presents  to the office today for treatment of the  long nails and a foot evaluation due to history of  Diabetes. Patient has venous stasis disease and diabetes.  General Appearance  Alert, conversant and in no acute stress.  Vascular  Dorsalis pedis and posterior tibial  pulses are palpable  bilaterally.  Capillary return is within normal limits  bilaterally. Temperature is within normal limits  bilaterally.  Neurologic  Senn-Weinstein monofilament wire test within normal limits  bilaterally. Muscle power within normal limits bilaterally.  Nails Thick disfigured discolored nails with subungual debris  from hallux right.. No evidence of bacterial infection or drainage bilaterally.  Orthopedic  No limitations of motion of motion feet .  No crepitus or effusions noted.  No bony pathology or digital deformities noted.  Skin  normotropic skin with no porokeratosis noted bilaterally.  No signs of infections or ulcers noted.   Venous stasis lesions both legs.  Nail dystrophy  Hematoma right hallux.  IE  Debride nails x 10.  A diabetic foot exam was performed and there is no evidence of any vascular or neurologic pathology.   RTC 3 months.   Gardiner Barefoot DPM

## 2018-12-19 LAB — BASIC METABOLIC PANEL
BUN/Creatinine Ratio: 26 — ABNORMAL HIGH (ref 9–20)
BUN: 22 mg/dL (ref 6–24)
CALCIUM: 9.3 mg/dL (ref 8.7–10.2)
CO2: 27 mmol/L (ref 20–29)
Chloride: 96 mmol/L (ref 96–106)
Creatinine, Ser: 0.84 mg/dL (ref 0.76–1.27)
GFR, EST AFRICAN AMERICAN: 112 mL/min/{1.73_m2} (ref >59)
GFR, EST NON AFRICAN AMERICAN: 97 mL/min/{1.73_m2} (ref >59)
Glucose: 176 mg/dL — ABNORMAL HIGH (ref 65–99)
Potassium: 3.7 mmol/L (ref 3.5–5.2)
Sodium: 141 mmol/L (ref 134–144)

## 2018-12-20 ENCOUNTER — Encounter: Payer: Self-pay | Admitting: Internal Medicine

## 2019-01-02 ENCOUNTER — Ambulatory Visit: Payer: Managed Care, Other (non HMO) | Admitting: Internal Medicine

## 2019-01-02 ENCOUNTER — Encounter: Payer: Self-pay | Admitting: Internal Medicine

## 2019-01-02 DIAGNOSIS — Z800 Family history of malignant neoplasm of digestive organs: Secondary | ICD-10-CM

## 2019-01-02 DIAGNOSIS — I872 Venous insufficiency (chronic) (peripheral): Secondary | ICD-10-CM

## 2019-01-02 DIAGNOSIS — E7800 Pure hypercholesterolemia, unspecified: Secondary | ICD-10-CM

## 2019-01-02 DIAGNOSIS — I10 Essential (primary) hypertension: Secondary | ICD-10-CM

## 2019-01-02 DIAGNOSIS — K219 Gastro-esophageal reflux disease without esophagitis: Secondary | ICD-10-CM | POA: Diagnosis not present

## 2019-01-02 DIAGNOSIS — Z6841 Body Mass Index (BMI) 40.0 and over, adult: Secondary | ICD-10-CM

## 2019-01-02 DIAGNOSIS — E1351 Other specified diabetes mellitus with diabetic peripheral angiopathy without gangrene: Secondary | ICD-10-CM

## 2019-01-02 DIAGNOSIS — M7989 Other specified soft tissue disorders: Secondary | ICD-10-CM

## 2019-01-02 DIAGNOSIS — E559 Vitamin D deficiency, unspecified: Secondary | ICD-10-CM

## 2019-01-02 DIAGNOSIS — D649 Anemia, unspecified: Secondary | ICD-10-CM

## 2019-01-02 NOTE — Patient Instructions (Signed)
Have fasting labs drawn at [facility] the week before your next appt.

## 2019-01-02 NOTE — Progress Notes (Signed)
[name], male   DOB: 12-May-1961, 58 y.o.   MRN: [id number]   Subjective:    [name], male    DOB: April 25, 1961, 58 y.o.   MRN: [id number]  HPI  Patient here for a scheduled follow up.  He reports he is doing relatively well.  Discussed diet and exercise.  Discussed weight loss.  No chest pain.  No sob.  Breathing stable.  No abdominal pain.  Bowels moving.  When has a large bowel movement, occasionally will notice some blood streaks.  Discussed taking pepcid.  Work is going well.  Is concerned regarding soft tissue fullness - below right knee.     Past Medical History:  Diagnosis Date  . 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 Depression   . Environmental allergies   . Family history of colon cancer   . GERD (gastroesophageal reflux disease)   . History of chicken pox   . Hyperlipidemia   . Hypertension   . Venous stasis   . Vitamin D deficiency    Past Surgical History:  Procedure Laterality Date  . CHOLECYSTECTOMY  2002  . COLONOSCOPY WITH PROPOFOL N/A 06/27/2015   Procedure: COLONOSCOPY WITH PROPOFOL;  Surgeon: Lollie Sails, MD;  Location: [facility] ENDOSCOPY;  Service: Endoscopy;  Laterality: N/A;  . HERNIA REPAIR  1962   Family History  Problem Relation Age of Onset  . Stomach cancer Mother   . Arthritis Mother   . Diabetes Mother   . Hypertension Mother   . Hyperlipidemia Father   . Heart disease Father   . Diabetes Father   . Hypertension Father   . Arthritis Maternal Grandmother   . Hyperlipidemia Maternal Grandmother   . Hypertension Maternal Grandmother   . Arthritis Maternal Grandfather   . Hyperlipidemia Maternal Grandfather   . Heart disease Maternal Grandfather   . Hypertension Maternal Grandfather   . Arthritis Paternal Grandmother   . Breast cancer Paternal Grandmother   . Hypertension Paternal Grandmother   . Arthritis Paternal  Grandfather   . Hyperlipidemia Paternal Grandfather   . Heart disease Paternal Grandfather   . Diabetes Paternal Grandfather   . Hypertension Paternal Grandfather   . Diabetes Sister    Social History   Socioeconomic History  . Marital status: Widowed    Spouse name: Not on file  . Number of children: Not on file  . Years of education: Not on file  . Highest education level: Not on file  Occupational History  . Not on file  Social Needs  . Financial resource strain: Not on file  . Food insecurity:    Worry: Not on file    Inability: Not on file  . Transportation needs:    Medical: Not on file    Non-medical: Not on file  Tobacco Use  . Smoking status: Never Smoker  . Smokeless tobacco: Never Used  Substance and Sexual Activity  . Alcohol use: Yes    Alcohol/week: 0.0 - 1.0 standard drinks    Comment: 1 drink every 2 months  . Drug use: No  . Sexual activity: Not on file  Lifestyle  . Physical activity:    Days per week: Not on file    Minutes per session: Not on file  . Stress: Not on file  Relationships  . Social connections:    Talks on phone: Not on file  Gets together: Not on file    Attends religious service: Not on file    Active member of club or organization: Not on file    Attends meetings of clubs or organizations: Not on file    Relationship status: Not on file  Other Topics Concern  . Not on file  Social History Narrative  . Not on file    Outpatient Encounter Medications as of 01/02/2019  Medication Sig  . cholecalciferol (VITAMIN D) 1000 units tablet Take 1,000 Units by mouth daily.  . hydrochlorothiazide (HYDRODIURIL) 25 MG tablet Take 1 tablet (25 mg total) by mouth daily.  Marland Kitchen lisinopril (PRINIVIL,ZESTRIL) 40 MG tablet TAKE 1 TABLET BY MOUTH  DAILY  . loratadine (CLARITIN) 10 MG tablet Take 10 mg by mouth daily as needed for allergies.  . metFORMIN (GLUCOPHAGE) 500 MG tablet Take 1 tablet (500 mg total) by mouth 2 (two) times daily with a  meal.  . mupirocin ointment (BACTROBAN) 2 % Apply to affected area on leg bid  . ranitidine (ZANTAC) 150 MG tablet Take 150 mg by mouth at bedtime as needed for heartburn.  . rosuvastatin (CRESTOR) 10 MG tablet TAKE 1 TABLET BY MOUTH  DAILY   No facility-administered encounter medications on file as of 01/02/2019.     Review of Systems  Constitutional: Negative for appetite change and unexpected weight change.  HENT: Negative for congestion and sinus pressure.   Respiratory: Negative for cough, chest tightness and shortness of breath.   Cardiovascular: Negative for chest pain and palpitations.       Leg swelling - improved.    Gastrointestinal: Negative for abdominal pain, diarrhea, nausea and vomiting.  Genitourinary: Negative for difficulty urinating and dysuria.  Musculoskeletal: Negative for joint swelling and myalgias.  Skin: Negative for color change and rash.  Neurological: Negative for dizziness and headaches.  Psychiatric/Behavioral: Negative for agitation and dysphoric mood.       Objective:    Physical Exam Constitutional:      General: He is not in acute distress.    Appearance: Normal appearance. He is well-developed.  HENT:     Nose: Nose normal. No congestion.     Mouth/Throat:     Pharynx: No oropharyngeal exudate or posterior oropharyngeal erythema.  Cardiovascular:     Rate and Rhythm: Normal rate and regular rhythm.  Pulmonary:     Effort: Pulmonary effort is normal. No respiratory distress.     Breath sounds: Normal breath sounds.  Abdominal:     General: Bowel sounds are normal.     Palpations: Abdomen is soft.     Tenderness: There is no abdominal tenderness.  Musculoskeletal:     Comments: Pedal and lower extremity swelling - improved/stable.  Stasis changes present.    Skin:    Comments: Stasis changes present - lower extremities.    Neurological:     Mental Status: He is alert.  Psychiatric:        Mood and Affect: Mood normal.         Behavior: Behavior normal.     BP 130/72   Pulse 73   Temp (!) 97.4 F (36.3 C) (Oral)   Wt (!) 316 lb 3.2 oz (143.4 kg)   SpO2 99%   BMI 41.72 kg/m  Wt Readings from Last 3 Encounters:  01/02/19 (!) 316 lb 3.2 oz (143.4 kg)  10/30/18 (!) 317 lb (143.8 kg)  06/30/18 (!) 327 lb 6.4 oz (148.5 kg)     Lab Results  Component  Value Date   WBC 5.7 04/28/2018   HGB 14.4 04/28/2018   HCT 42.3 04/28/2018   PLT 246 04/28/2018   GLUCOSE 176 (H) 12/18/2018   CHOL 141 10/27/2018   TRIG 116 10/27/2018   HDL 44 10/27/2018   LDLCALC 74 10/27/2018   ALT 17 10/27/2018   AST 14 10/27/2018   NA 141 12/18/2018   K 3.7 12/18/2018   CL 96 12/18/2018   CREATININE 0.84 12/18/2018   BUN 22 12/18/2018   CO2 27 12/18/2018   TSH 1.250 04/28/2018   HGBA1C 7.1 (H) 10/27/2018    Dg Lumbar Spine 2-3 Views  Result Date: 09/17/2015 CLINICAL DATA:  Lower back pain. EXAM: LUMBAR SPINE - 2-3 VIEW COMPARISON:  None. FINDINGS: Multilevel spondylosis noted throughout the visualized spine. Moderate proliferative changes are present at T12-L1 and L1-L2. There is suggestion of a minimal retrolisthesis of L2 on L3 of approximately 4-5 mm. Mild proliferative changes are present at L3-4. Moderate facet hypertrophy present at L4-5 and L5-S1. No acute fracture identified. No bony lesions are seen. 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 There is mild retrolisthesis of L2 on L3 by approximately 4-5 mm. Electronically Signed   By: Aletta Edouard M.D.   On: 09/17/2015 09:02       Assessment & Plan:   Problem List Items Addressed This Visit    Anemia    Follow cbc.       BMI 40.0-44.9, adult (HCC)    Discussed diet and exercise.  Follow.        Chronic venous insufficiency    Continue compression hose.  Follow.  Swelling improved.        Essential hypertension    Blood pressure on recheck improved.  Continue current medication regimen.  Follow pressures.  Follow  metabolic panel.        Relevant Orders   Basic metabolic panel   Family history of colon cancer    Colonoscopy 06/2015 with tubular adenoma x 2 and hyperplastic polyp removed.  Diverticulosis.  Occasional streak of blood with large bm.  No other bleeding.  Declines no further w/up at this time.        GERD (gastroesophageal reflux disease)    Controlled on current regimen.  Follow.        Hypercholesterolemia    On crestor.  Low cholesterol diet and exercise.  Follow lipid panel and liver function tests.        Secondary diabetes with peripheral circulatory disorder (HCC)    Low carb diet and exercise.  Has adjusted diet.  Follow met b and a1c.        Relevant Orders   Hemoglobin A1c   Hepatic function panel   Lipid panel   Soft tissue mass    Soft tissue mass - below right knee.  Question of lipoma.  Refer to surgery for evaluation.        Relevant Orders   Ambulatory referral to General Surgery   Vitamin D deficiency    Follow vitamin D level.            [name], MD

## 2019-01-06 ENCOUNTER — Other Ambulatory Visit: Payer: Self-pay | Admitting: Internal Medicine

## 2019-01-08 ENCOUNTER — Encounter: Payer: Self-pay | Admitting: Internal Medicine

## 2019-01-08 DIAGNOSIS — M7989 Other specified soft tissue disorders: Secondary | ICD-10-CM | POA: Insufficient documentation

## 2019-01-08 NOTE — Assessment & Plan Note (Signed)
Continue compression hose.  Follow.  Swelling improved.

## 2019-01-08 NOTE — Assessment & Plan Note (Signed)
Low carb diet and exercise.  Has adjusted diet.  Follow met b and a1c.

## 2019-01-08 NOTE — Assessment & Plan Note (Signed)
Soft tissue mass - below right knee.  Question of lipoma.  Refer to surgery for evaluation.

## 2019-01-08 NOTE — Assessment & Plan Note (Signed)
On crestor.  Low cholesterol diet and exercise.  Follow lipid panel and liver function tests.   

## 2019-01-08 NOTE — Assessment & Plan Note (Signed)
Discussed diet and exercise.  Follow.  

## 2019-01-08 NOTE — Assessment & Plan Note (Signed)
Follow cbc.  

## 2019-01-08 NOTE — Assessment & Plan Note (Signed)
Blood pressure on recheck improved.  Continue current medication regimen.  Follow pressures.  Follow metabolic panel.   

## 2019-01-08 NOTE — Assessment & Plan Note (Signed)
Follow vitamin D level.  

## 2019-01-08 NOTE — Assessment & Plan Note (Signed)
Controlled on current regimen.  Follow.  

## 2019-01-08 NOTE — Assessment & Plan Note (Signed)
Colonoscopy 06/2015 with tubular adenoma x 2 and hyperplastic polyp removed.  Diverticulosis.  Occasional streak of blood with large bm.  No other bleeding.  Declines no further w/up at this time.

## 2019-01-10 ENCOUNTER — Encounter: Payer: Self-pay | Admitting: Internal Medicine

## 2019-01-10 ENCOUNTER — Other Ambulatory Visit: Payer: Self-pay

## 2019-01-10 MED ORDER — HYDROCHLOROTHIAZIDE 25 MG PO TABS: 25.0000 mg | ORAL_TABLET | Freq: Every day | ORAL | 1 refills | Status: DC

## 2019-01-30 ENCOUNTER — Encounter: Payer: Self-pay | Admitting: General Surgery

## 2019-01-30 ENCOUNTER — Other Ambulatory Visit: Payer: Self-pay

## 2019-01-30 ENCOUNTER — Ambulatory Visit (INDEPENDENT_AMBULATORY_CARE_PROVIDER_SITE_OTHER): Payer: Managed Care, Other (non HMO) | Admitting: General Surgery

## 2019-01-30 VITALS — BP 168/88 | HR 104 | Temp 97.7°F | Ht 72.0 in | Wt 321.0 lb

## 2019-01-30 DIAGNOSIS — R2241 Localized swelling, mass and lump, right lower limb: Secondary | ICD-10-CM

## 2019-01-30 DIAGNOSIS — R2242 Localized swelling, mass and lump, left lower limb: Secondary | ICD-10-CM | POA: Diagnosis not present

## 2019-01-30 NOTE — Patient Instructions (Signed)
Return as needed.The patient is aware to call back for any questions or concerns.  

## 2019-01-30 NOTE — Progress Notes (Signed)
[name], male   DOB: 02/15/1961, 58 y.o.   MRN: [id number]  Chief Complaint  Patient presents with  . Other    HPI [name] is a 58 y.o. male here today for a evaluation of a soft tissue mass right  leg. Marland Kitchen He noticed this area about a month ago. No pain.  The patient is not aware of any trauma to the area. HPI  Past Medical History:  Diagnosis Date  . 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 Depression   . Environmental allergies   . Family history of colon cancer   . GERD (gastroesophageal reflux disease)   . History of chicken pox   . Hyperlipidemia   . Hypertension   . Venous stasis   . Vitamin D deficiency     Past Surgical History:  Procedure Laterality Date  . CHOLECYSTECTOMY  2002  . COLONOSCOPY WITH PROPOFOL N/A 06/27/2015   Procedure: COLONOSCOPY WITH PROPOFOL;  Surgeon: Lollie Sails, MD;  Location: [facility] ENDOSCOPY;  Service: Endoscopy;  Laterality: N/A;  . HERNIA REPAIR  1962    Family History  Problem Relation Age of Onset  . Stomach cancer Mother   . Arthritis Mother   . Diabetes Mother   . Hypertension Mother   . Hyperlipidemia Father   . Heart disease Father   . Diabetes Father   . Hypertension Father   . Arthritis Maternal Grandmother   . Hyperlipidemia Maternal Grandmother   . Hypertension Maternal Grandmother   . Arthritis Maternal Grandfather   . Hyperlipidemia Maternal Grandfather   . Heart disease Maternal Grandfather   . Hypertension Maternal Grandfather   . Arthritis Paternal Grandmother   . Breast cancer Paternal Grandmother   . Hypertension Paternal Grandmother   . Arthritis Paternal Grandfather   . Hyperlipidemia Paternal Grandfather   . Heart disease Paternal Grandfather   . Diabetes Paternal Grandfather   . Hypertension Paternal Grandfather   . Diabetes Sister     Social History Social History   Tobacco Use  .  Smoking status: Never Smoker  . Smokeless tobacco: Never Used  Substance Use Topics  . Alcohol use: Yes    Alcohol/week: 0.0 - 1.0 standard drinks    Comment: 1 drink every 2 months  . Drug use: No    No Known Allergies  Current Outpatient Medications  Medication Sig Dispense Refill  . cholecalciferol (VITAMIN D) 1000 units tablet Take 1,000 Units by mouth daily.    . hydrochlorothiazide (HYDRODIURIL) 25 MG tablet Take 1 tablet (25 mg total) by mouth daily. 90 tablet 1  . lisinopril (PRINIVIL,ZESTRIL) 40 MG tablet TAKE 1 TABLET BY MOUTH  DAILY 90 tablet 1  . loratadine (CLARITIN) 10 MG tablet Take 10 mg by mouth daily as needed for allergies.    . metFORMIN (GLUCOPHAGE) 500 MG tablet Take 1 tablet (500 mg total) by mouth 2 (two) times daily with a meal. 180 tablet 3  . mupirocin ointment (BACTROBAN) 2 % Apply to affected area on leg bid 22 g 0  . ranitidine (ZANTAC) 150 MG tablet Take 150 mg by mouth at bedtime as needed for heartburn.    . rosuvastatin (CRESTOR) 10 MG tablet TAKE 1 TABLET BY MOUTH  DAILY 90 tablet 1   No current facility-administered medications for this visit.     Review of Systems Review of Systems  Constitutional: Negative.  Respiratory: Negative.   Cardiovascular: Negative.     Blood pressure (!) 168/88, pulse (!) 104, temperature 97.7 F (36.5 C), temperature source Skin, height 6' (1.829 m), weight (!) 321 lb (145.6 kg), SpO2 95 %.  Physical Exam Physical Exam Musculoskeletal:       Legs:     Data Reviewed Ultrasound examination of the proximal medial calf was completed.  The saphenous vein is patent with normal flow augmentation with distal compression and normal compressibility both above and below the palpable mass.  The nodule itself appears to be a thrombosed varix with no flow.  This does appear to be in continuity with the saphenous vein.  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  Plan  Local heat to  accelerate resolution and a daily aspirin tablet to minimize the risk of superficial phlebitis was recommended.  No additional treatment recommended at this time.  Excision is not warranted.  HPI, Physical Exam, Assessment and Plan have been scribed under the direction and in the presence of Hervey Ard, MD.  [name], CMA   I have completed the exam and reviewed the above documentation for accuracy and completeness.  I agree with the above.  Haematologist has been used and any errors in dictation or transcription are unintentional.  Hervey Ard, M.D., F.A.C.S.  [name] 01/31/2019, 12:03 PM

## 2019-03-07 ENCOUNTER — Other Ambulatory Visit: Payer: Self-pay | Admitting: Internal Medicine

## 2019-03-12 ENCOUNTER — Ambulatory Visit (INDEPENDENT_AMBULATORY_CARE_PROVIDER_SITE_OTHER): Payer: Managed Care, Other (non HMO) | Admitting: Internal Medicine

## 2019-03-12 ENCOUNTER — Other Ambulatory Visit: Payer: Self-pay

## 2019-03-12 ENCOUNTER — Encounter: Payer: Self-pay | Admitting: Internal Medicine

## 2019-03-12 DIAGNOSIS — D649 Anemia, unspecified: Secondary | ICD-10-CM | POA: Diagnosis not present

## 2019-03-12 DIAGNOSIS — E559 Vitamin D deficiency, unspecified: Secondary | ICD-10-CM

## 2019-03-12 DIAGNOSIS — I872 Venous insufficiency (chronic) (peripheral): Secondary | ICD-10-CM | POA: Diagnosis not present

## 2019-03-12 DIAGNOSIS — E7800 Pure hypercholesterolemia, unspecified: Secondary | ICD-10-CM

## 2019-03-12 DIAGNOSIS — I10 Essential (primary) hypertension: Secondary | ICD-10-CM | POA: Diagnosis not present

## 2019-03-12 DIAGNOSIS — E1351 Other specified diabetes mellitus with diabetic peripheral angiopathy without gangrene: Secondary | ICD-10-CM

## 2019-03-12 DIAGNOSIS — R600 Localized edema: Secondary | ICD-10-CM

## 2019-03-12 NOTE — Progress Notes (Addendum)
Patient ID: BARNARD SHARPS, male   DOB: 25-Nov-1960, 58 y.o.   MRN: [id number] Virtual Visit via video: Note  This visit type was conducted due to national recommendations for restrictions regarding the COVID-19 pandemic (e.g. social distancing).  This format is felt to be most appropriate for this patient at this time.  All issues noted in this document were discussed and addressed.  No physical exam was performed (except for noted visual exam findings with Video Visits).   I connected with Trisha Mangle on 03/12/19 at  3:30 PM EDT by a video enabled telemedicine application.  Verified that I am speaking with the correct person using two identifiers. Location patient: home Location provider: work Persons participating in the virtual visit: patient, provider  I discussed the limitations, risks, security and privacy concerns of performing an evaluation and management service by video and the availability of in person appointments.The patient expressed understanding and agreed to proceed.  Interactive audio and video telecommunications were attempted between this provider and patient.  He was able to see and hear me.  I could hear, but could not visualize Mr Woodrick.  He wanted to proceed.    We continued and completed visit - as above.   Reason for visit: scheduled follow up.   HPI: He reports he is doing relatively well.  Trying to stay in.  No known COVID exposure.  No fever.  No chest congestion, cough or sob.  Has had some allergy symptome, but is controlling this with claritin, flonase an saline nasal flushes.  After the last visit, he stopped taking advil.  GI symptoms resolved.  No acid reflux.  Not taking pepcid.  Not needing.  No blood in stool.  Bowls doing well.  Trying to watch his diet.  Discussed importance of diet and exercise.  No chest pain.  No abdominal pain.  States weight is staying stable.  Reports legs doing well.  No increased swelling.  The previous soft tissue mass right leg -  has improved.  Saw Dr Bary Castilla.  Note reviewed.  Handling stress.  Overall feels he is doing well.     ROS: See pertinent positives and negatives per HPI.  Past Medical History:  Diagnosis Date  . 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 Depression   . Environmental allergies   . Family history of colon cancer   . GERD (gastroesophageal reflux disease)   . History of chicken pox   . Hyperlipidemia   . Hypertension   . Venous stasis   . Vitamin D deficiency     Past Surgical History:  Procedure Laterality Date  . CHOLECYSTECTOMY  2002  . COLONOSCOPY WITH PROPOFOL N/A 06/27/2015   Procedure: COLONOSCOPY WITH PROPOFOL;  Surgeon: Lollie Sails, MD;  Location: [facility] ENDOSCOPY;  Service: Endoscopy;  Laterality: N/A;  . HERNIA REPAIR  1962    Family History  Problem Relation Age of Onset  . Stomach cancer Mother   . Arthritis Mother   . Diabetes Mother   . Hypertension Mother   . Hyperlipidemia Father   . Heart disease Father   . Diabetes Father   . Hypertension Father   . Arthritis Maternal Grandmother   . Hyperlipidemia Maternal Grandmother   . Hypertension Maternal Grandmother   . Arthritis Maternal Grandfather   . Hyperlipidemia Maternal Grandfather   . Heart disease Maternal Grandfather   . Hypertension Maternal Grandfather   .  Arthritis Paternal Grandmother   . Breast cancer Paternal Grandmother   . Hypertension Paternal Grandmother   . Arthritis Paternal Grandfather   . Hyperlipidemia Paternal Grandfather   . Heart disease Paternal Grandfather   . Diabetes Paternal Grandfather   . Hypertension Paternal Grandfather   . Diabetes Sister     SOCIAL HX: reviewed.    Current Outpatient Medications:  .  cholecalciferol (VITAMIN D) 1000 units tablet, Take 1,000 Units by mouth daily., Disp: , Rfl:  .  hydrochlorothiazide (HYDRODIURIL) 25 MG tablet, Take 1 tablet (25 mg total) by  mouth daily., Disp: 90 tablet, Rfl: 1 .  lisinopril (PRINIVIL,ZESTRIL) 40 MG tablet, TAKE 1 TABLET BY MOUTH  DAILY, Disp: 90 tablet, Rfl: 1 .  loratadine (CLARITIN) 10 MG tablet, Take 10 mg by mouth daily as needed for allergies., Disp: , Rfl:  .  metFORMIN (GLUCOPHAGE) 500 MG tablet, Take 1 tablet (500 mg total) by mouth 2 (two) times daily with a meal., Disp: 180 tablet, Rfl: 3 .  mupirocin ointment (BACTROBAN) 2 %, Apply to affected area on leg bid, Disp: 22 g, Rfl: 0 .  ranitidine (ZANTAC) 150 MG tablet, Take 150 mg by mouth at bedtime as needed for heartburn., Disp: , Rfl:  .  rosuvastatin (CRESTOR) 10 MG tablet, TAKE 1 TABLET BY MOUTH  DAILY, Disp: 90 tablet, Rfl: 1  EXAM:  GENERAL: alert.  Sounds to be in no acute distress.  Answering questions appropriately.    PSYCH/NEURO: pleasant and cooperative, no obvious depression or anxiety, speech and thought processing grossly intact  ASSESSMENT AND PLAN:  Discussed the following assessment and plan:  Anemia, unspecified type  Chronic venous insufficiency  Essential hypertension  Hypercholesterolemia  Lower extremity edema  Secondary diabetes with peripheral circulatory disorder (HCC)  Vitamin D deficiency  Anemia Follow cbc.   Chronic venous insufficiency Continue compression hose.  Reports legs doing well.    Essential hypertension Reports blood pressure doing ok.  Follow pressures.  Continue same medication regimen.  Follow metabolic panel.    Hypercholesterolemia On crestor.  Low cholesterol diet and exercise.  Follow lipid panel and liver function tests.    Lower extremity edema Reports swelling doing well with compression hose.  Follow.    Secondary diabetes with peripheral circulatory disorder Discussed low carb diet and exercise.  Follow met b and a1c.    Vitamin D deficiency Follow vitamin D level.      I discussed the assessment and treatment plan with the patient. The patient was provided an  opportunity to ask questions and all were answered. The patient agreed with the plan and demonstrated an understanding of the instructions.   The patient was advised to call back or seek an in-person evaluation if the symptoms worsen or if the condition fails to improve as anticipated.    [name], MD

## 2019-03-15 ENCOUNTER — Encounter: Payer: Self-pay | Admitting: Internal Medicine

## 2019-03-15 NOTE — Assessment & Plan Note (Signed)
Reports blood pressure doing ok.  Follow pressures.  Continue same medication regimen.  Follow metabolic panel.

## 2019-03-15 NOTE — Assessment & Plan Note (Signed)
Continue compression hose.  Reports legs doing well.

## 2019-03-15 NOTE — Assessment & Plan Note (Signed)
Discussed low carb diet and exercise.  Follow met b and a1c.   

## 2019-03-15 NOTE — Assessment & Plan Note (Signed)
Follow cbc.  

## 2019-03-15 NOTE — Assessment & Plan Note (Signed)
Reports swelling doing well with compression hose.  Follow.

## 2019-03-15 NOTE — Assessment & Plan Note (Signed)
On crestor.  Low cholesterol diet and exercise.  Follow lipid panel and liver function tests.   

## 2019-03-15 NOTE — Assessment & Plan Note (Signed)
Follow vitamin D level.  

## 2019-03-17 ENCOUNTER — Other Ambulatory Visit: Payer: Self-pay | Admitting: Internal Medicine

## 2019-03-19 ENCOUNTER — Encounter: Payer: Self-pay | Admitting: Podiatry

## 2019-03-19 ENCOUNTER — Other Ambulatory Visit: Payer: Self-pay

## 2019-03-19 ENCOUNTER — Ambulatory Visit: Payer: Managed Care, Other (non HMO) | Admitting: Podiatry

## 2019-03-19 VITALS — Temp 97.6°F

## 2019-03-19 DIAGNOSIS — M79675 Pain in left toe(s): Secondary | ICD-10-CM | POA: Diagnosis not present

## 2019-03-19 DIAGNOSIS — I878 Other specified disorders of veins: Secondary | ICD-10-CM | POA: Diagnosis not present

## 2019-03-19 DIAGNOSIS — B351 Tinea unguium: Secondary | ICD-10-CM

## 2019-03-19 DIAGNOSIS — E119 Type 2 diabetes mellitus without complications: Secondary | ICD-10-CM | POA: Diagnosis not present

## 2019-03-19 DIAGNOSIS — M79674 Pain in right toe(s): Secondary | ICD-10-CM

## 2019-03-19 NOTE — Progress Notes (Signed)
Complaint:  Visit Type: Patient returns to my office for continued preventative foot care services. Complaint: Patient states" my nails have grown long and thick and become painful to walk and wear shoes" Patient has been diagnosed with DM with no foot complications. The patient presents for preventative foot care services. No changes to ROS  Podiatric Exam: Vascular: dorsalis pedis and posterior tibial pulses are palpable bilateral. Capillary return is immediate. Temperature gradient is WNL. Skin turgor WNL  Sensorium: Normal Semmes Weinstein monofilament test. Normal tactile sensation bilaterally. Nail Exam: Pt has thick disfigured discolored nails with subungual debris noted bilateral entire nail hallux  Ulcer Exam: There is no evidence of ulcer or pre-ulcerative changes or infection. Orthopedic Exam: Muscle tone and strength are WNL. No limitations in general ROM. No crepitus or effusions noted. Foot type and digits show no abnormalities. Bony prominences are unremarkable. Skin: No Porokeratosis. No infection or ulcers  Diagnosis:  Onychomycosis, , Pain in right toe, pain in left toes  Treatment & Plan Procedures and Treatment: Consent by patient was obtained for treatment procedures.   Debridement of mycotic and hypertrophic toenails, 1 through 5 bilateral and clearing of subungual debris. No ulceration, no infection noted.  Return Visit-Office Procedure: Patient instructed to return to the office for a follow up visit 3 months for continued evaluation and treatment.    Gardiner Barefoot DPM

## 2019-06-18 ENCOUNTER — Encounter: Payer: Self-pay | Admitting: Podiatry

## 2019-06-18 ENCOUNTER — Other Ambulatory Visit: Payer: Self-pay

## 2019-06-18 ENCOUNTER — Ambulatory Visit: Payer: Managed Care, Other (non HMO) | Admitting: Podiatry

## 2019-06-18 VITALS — Temp 92.1°F

## 2019-06-18 DIAGNOSIS — M79675 Pain in left toe(s): Secondary | ICD-10-CM

## 2019-06-18 DIAGNOSIS — B351 Tinea unguium: Secondary | ICD-10-CM

## 2019-06-18 DIAGNOSIS — M79674 Pain in right toe(s): Secondary | ICD-10-CM | POA: Diagnosis not present

## 2019-06-18 DIAGNOSIS — E119 Type 2 diabetes mellitus without complications: Secondary | ICD-10-CM | POA: Diagnosis not present

## 2019-06-18 DIAGNOSIS — I878 Other specified disorders of veins: Secondary | ICD-10-CM

## 2019-06-18 NOTE — Progress Notes (Signed)
Complaint:  Visit Type: Patient returns to my office for continued preventative foot care services. Complaint: Patient states" my nails have grown long and thick and become painful to walk and wear shoes" Patient has been diagnosed with DM with no foot complications. The patient presents for preventative foot care services. No changes to ROS  Podiatric Exam: Vascular: dorsalis pedis and posterior tibial pulses are weakly palpable bilateral. Due to swelling. Capillary return is immediate. Temperature gradient is WNL. Skin turgor WNL .  Venous stasis. Sensorium: Normal Semmes Weinstein monofilament test. Normal tactile sensation bilaterally. Nail Exam: Pt has thick disfigured discolored nails with subungual debris noted bilateral entire nail hallux  Ulcer Exam: There is no evidence of ulcer or pre-ulcerative changes or infection. Orthopedic Exam: Muscle tone and strength are WNL. No limitations in general ROM. No crepitus or effusions noted. Foot type and digits show no abnormalities. Bony prominences are unremarkable. Skin: No Porokeratosis. No infection or ulcers  Diagnosis:  Onychomycosis, , Pain in right toe, pain in left toes  Treatment & Plan Procedures and Treatment: Consent by patient was obtained for treatment procedures.   Debridement of mycotic and hypertrophic toenails, 1 through 5 bilateral and clearing of subungual debris. No ulceration, no infection noted.  Return Visit-Office Procedure: Patient instructed to return to the office for a follow up visit 3 months for continued evaluation and treatment.    Gardiner Barefoot DPM

## 2019-06-25 ENCOUNTER — Telehealth: Payer: Self-pay | Admitting: Internal Medicine

## 2019-06-25 MED ORDER — METFORMIN HCL 500 MG PO TABS: 500.0000 mg | ORAL_TABLET | Freq: Two times a day (BID) | ORAL | 3 refills | Status: DC

## 2019-06-25 NOTE — Telephone Encounter (Signed)
Refill on metformin sent to [facility].

## 2019-06-25 NOTE — Telephone Encounter (Signed)
Patient would like a refill on his metFORMIN (GLUCOPHAGE) 500 MG tablet medication and have it sent to his preferred pharmacy [facility] on [address].  Patient also wanted Dr. Nicki Reaper to know that his wife's mother passed away.

## 2019-06-26 ENCOUNTER — Other Ambulatory Visit: Payer: Self-pay

## 2019-06-26 NOTE — Telephone Encounter (Signed)
Noted. Mychart message sent.

## 2019-07-24 ENCOUNTER — Encounter: Payer: Self-pay | Admitting: Internal Medicine

## 2019-07-24 ENCOUNTER — Other Ambulatory Visit: Payer: Self-pay

## 2019-07-24 ENCOUNTER — Ambulatory Visit (INDEPENDENT_AMBULATORY_CARE_PROVIDER_SITE_OTHER): Payer: Managed Care, Other (non HMO) | Admitting: Internal Medicine

## 2019-07-24 VITALS — BP 134/82 | HR 90 | Temp 97.6°F | Resp 16 | Wt 317.2 lb

## 2019-07-24 DIAGNOSIS — L97829 Non-pressure chronic ulcer of other part of left lower leg with unspecified severity: Secondary | ICD-10-CM

## 2019-07-24 DIAGNOSIS — I83028 Varicose veins of left lower extremity with ulcer other part of lower leg: Secondary | ICD-10-CM

## 2019-07-24 DIAGNOSIS — Z23 Encounter for immunization: Secondary | ICD-10-CM

## 2019-07-24 DIAGNOSIS — K219 Gastro-esophageal reflux disease without esophagitis: Secondary | ICD-10-CM

## 2019-07-24 DIAGNOSIS — D649 Anemia, unspecified: Secondary | ICD-10-CM | POA: Diagnosis not present

## 2019-07-24 DIAGNOSIS — I872 Venous insufficiency (chronic) (peripheral): Secondary | ICD-10-CM

## 2019-07-24 DIAGNOSIS — Z800 Family history of malignant neoplasm of digestive organs: Secondary | ICD-10-CM

## 2019-07-24 DIAGNOSIS — I10 Essential (primary) hypertension: Secondary | ICD-10-CM

## 2019-07-24 DIAGNOSIS — Z0000 Encounter for general adult medical examination without abnormal findings: Secondary | ICD-10-CM

## 2019-07-24 DIAGNOSIS — E559 Vitamin D deficiency, unspecified: Secondary | ICD-10-CM

## 2019-07-24 DIAGNOSIS — E7800 Pure hypercholesterolemia, unspecified: Secondary | ICD-10-CM

## 2019-07-24 DIAGNOSIS — E1351 Other specified diabetes mellitus with diabetic peripheral angiopathy without gangrene: Secondary | ICD-10-CM

## 2019-07-24 LAB — HM DIABETES FOOT EXAM

## 2019-07-24 NOTE — Progress Notes (Addendum)
[name], male   DOB: Mar 20, 1961, 58 y.o.   MRN: [id number]   Subjective:    [name], male    DOB: 12/30/1960, 58 y.o.   MRN: [id number]  HPI  Patient here for his physical exam.  He reports he is doing relatively well.  Does have an open lesion - left leg.  Discussed with him today.  He reports is improving.  Declines referral to wound clinic.  No chest pain.  No sob.  No acid reflux.  No abdominal pain.  Bowels moving.  Discussed low carb diet and exercise.  He has not been watching his diet.  Not checking sugars.  Working from home.    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 ([place])    Pt states controlled by diet for last 4 years   Depression    Environmental allergies    Family history of colon cancer    GERD (gastroesophageal reflux disease)    History of chicken pox    Hyperlipidemia    Hypertension    Venous stasis    Vitamin D deficiency    Past Surgical History:  Procedure Laterality Date   CHOLECYSTECTOMY  2002   COLONOSCOPY WITH PROPOFOL N/A 06/27/2015   Procedure: COLONOSCOPY WITH PROPOFOL;  Surgeon: Lollie Sails, MD;  Location: [facility] ENDOSCOPY;  Service: Endoscopy;  Laterality: N/A;   HERNIA REPAIR  1962   Family History  Problem Relation Age of Onset   Stomach cancer Mother    Arthritis Mother    Diabetes Mother    Hypertension Mother    Hyperlipidemia Father    Heart disease Father    Diabetes Father    Hypertension Father    Arthritis Maternal Grandmother    Hyperlipidemia Maternal Grandmother    Hypertension Maternal Grandmother    Arthritis Maternal Grandfather    Hyperlipidemia Maternal Grandfather    Heart disease Maternal Grandfather    Hypertension Maternal Grandfather    Arthritis Paternal Grandmother    Breast cancer Paternal Grandmother    Hypertension Paternal Grandmother    Arthritis Paternal Grandfather     Hyperlipidemia Paternal Grandfather    Heart disease Paternal Grandfather    Diabetes Paternal Grandfather    Hypertension Paternal Grandfather    Diabetes Sister    Social History   Socioeconomic History   Marital status: Widowed    Spouse name: Not on file   Number of children: Not on file   Years of education: Not on file   Highest education level: Not on file  Occupational History   Not on file  Social Needs   Financial resource strain: Not on file   Food insecurity    Worry: Not on file    Inability: Not on file   Transportation needs    Medical: Not on file    Non-medical: Not on file  Tobacco Use   Smoking status: Never Smoker   Smokeless tobacco: Never Used  Substance and Sexual Activity   Alcohol use: Yes    Alcohol/week: 0.0 - 1.0 standard drinks    Comment: 1 drink every 2 months   Drug use: No   Sexual activity: Not on file  Lifestyle   Physical activity    Days per week: Not on file    Minutes per session: Not on file   Stress: Not on file  Relationships   Social connections    Talks on phone:  Not on file    Gets together: Not on file    Attends religious service: Not on file    Active member of club or organization: Not on file    Attends meetings of clubs or organizations: Not on file    Relationship status: Not on file  Other Topics Concern   Not on file  Social History Narrative   Not on file    Outpatient Encounter Medications as of 07/24/2019  Medication Sig   cholecalciferol (VITAMIN D) 1000 units tablet Take 1,000 Units by mouth daily.   hydrochlorothiazide (HYDRODIURIL) 25 MG tablet Take 1 tablet (25 mg total) by mouth daily.   lisinopril (ZESTRIL) 40 MG tablet TAKE 1 TABLET BY MOUTH  DAILY   loratadine (CLARITIN) 10 MG tablet Take 10 mg by mouth daily as needed for allergies.   metFORMIN (GLUCOPHAGE) 500 MG tablet Take 1 tablet (500 mg total) by mouth 2 (two) times daily with a meal.   mupirocin ointment  (BACTROBAN) 2 % Apply to affected area on leg bid   ranitidine (ZANTAC) 150 MG tablet Take 150 mg by mouth at bedtime as needed for heartburn.   rosuvastatin (CRESTOR) 10 MG tablet TAKE 1 TABLET BY MOUTH  DAILY   No facility-administered encounter medications on file as of 07/24/2019.     Review of Systems  Constitutional: Negative for appetite change and unexpected weight change.  HENT: Negative for congestion and sinus pressure.   Eyes: Negative for pain and visual disturbance.  Respiratory: Negative for cough, chest tightness and shortness of breath.   Cardiovascular: Negative for chest pain, palpitations and leg swelling.  Gastrointestinal: Negative for abdominal pain, diarrhea, nausea and vomiting.  Genitourinary: Negative for difficulty urinating and dysuria.  Musculoskeletal: Negative for joint swelling and myalgias.  Skin: Negative for color change and rash.  Neurological: Negative for dizziness, light-headedness and headaches.  Hematological: Negative for adenopathy. Does not bruise/bleed easily.  Psychiatric/Behavioral: Negative for agitation and dysphoric mood.       Objective:    Physical Exam Constitutional:      General: He is not in acute distress.    Appearance: Normal appearance. He is well-developed.  HENT:     Head: Normocephalic and atraumatic.     Right Ear: External ear normal.     Left Ear: External ear normal.     Mouth/Throat:     Pharynx: No oropharyngeal exudate.  Eyes:     General: No scleral icterus.       Right eye: No discharge.        Left eye: No discharge.     Conjunctiva/sclera: Conjunctivae normal.  Neck:     Musculoskeletal: Neck supple. No muscular tenderness.     Thyroid: No thyromegaly.  Cardiovascular:     Rate and Rhythm: Normal rate and regular rhythm.  Pulmonary:     Effort: No respiratory distress.     Breath sounds: Normal breath sounds. No wheezing.  Abdominal:     General: Bowel sounds are normal.     Palpations:  Abdomen is soft.     Tenderness: There is no abdominal tenderness.  Genitourinary:    Comments: Not performed.  Musculoskeletal:        General: No tenderness.     Comments: Open lesion - left lower extremity. No surrounding erythema.    Lymphadenopathy:     Cervical: No cervical adenopathy.  Skin:    Findings: No erythema or rash.  Neurological:     Mental Status: He  is alert and oriented to person, place, and time.  Psychiatric:        Mood and Affect: Mood normal.        Behavior: Behavior normal.     BP 134/82    Pulse 90    Temp 97.6 F (36.4 C) (Temporal)    Resp 16    Wt (!) 317 lb 3.2 oz (143.9 kg)    SpO2 97%    BMI 43.02 kg/m  Wt Readings from Last 3 Encounters:  07/24/19 (!) 317 lb 3.2 oz (143.9 kg)  01/30/19 (!) 321 lb (145.6 kg)  01/02/19 (!) 316 lb 3.2 oz (143.4 kg)     Lab Results  Component Value Date   WBC 5.7 04/28/2018   HGB 14.4 04/28/2018   HCT 42.3 04/28/2018   PLT 246 04/28/2018   GLUCOSE 176 (H) 12/18/2018   CHOL 141 10/27/2018   TRIG 116 10/27/2018   HDL 44 10/27/2018   LDLCALC 74 10/27/2018   ALT 17 10/27/2018   AST 14 10/27/2018   NA 141 12/18/2018   K 3.7 12/18/2018   CL 96 12/18/2018   CREATININE 0.84 12/18/2018   BUN 22 12/18/2018   CO2 27 12/18/2018   TSH 1.250 04/28/2018   HGBA1C 7.1 (H) 10/27/2018    Dg Lumbar Spine 2-3 Views  Result Date: 09/17/2015 CLINICAL DATA:  Lower back pain. EXAM: LUMBAR SPINE - 2-3 VIEW COMPARISON:  None. FINDINGS: Multilevel spondylosis noted throughout the visualized spine. Moderate proliferative changes are present at T12-L1 and L1-L2. There is suggestion of a minimal retrolisthesis of L2 on L3 of approximately 4-5 mm. Mild proliferative changes are present at L3-4. Moderate facet hypertrophy present at L4-5 and L5-S1. No acute fracture identified. No bony lesions are seen. 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 There is mild retrolisthesis of L2 on L3 by  approximately 4-5 mm. Electronically Signed   By: Aletta Edouard M.D.   On: 09/17/2015 09:02       Assessment & Plan:   Problem List Items Addressed This Visit    Anemia    Follow cbc.       Relevant Orders   CBC with Differential/Platelet   Chronic venous insufficiency    Continue compression hose.        Essential hypertension    Blood pressure on recheck improved.  Have him spot check his pressure and send in readings.  Continue current medication regimen.  Check metabolic panel.       Relevant Orders   TSH   Basic metabolic panel   Family history of colon cancer    Colonoscopy 06/2015 with tubular adenoma x 2 and hyperplastic polyp removed.  Diverticulosis.  Recommended f/u colonoscopy in 5 years.        GERD (gastroesophageal reflux disease)    Controlled.       Hypercholesterolemia    On crestor.  Low cholesterol diet and exercise.  Follow lipid panel and liver function tests.        Relevant Orders   Hepatic function panel   Lipid panel   Secondary diabetes with peripheral circulatory disorder (HCC)    Discussed low carb diet and exercise.  Follow met b and a1c.        Relevant Orders   Hemoglobin A1c   Venous stasis ulcer ([place])    Has open lesion on left lower leg.  Is healing - per pt.  Discussed local wound care.  He declines wound clinic  evaluation.  Call with update.        Vitamin D deficiency    Follow vitamin D level.        Relevant Orders   VITAMIN D 25 Hydroxy (Vit-D Deficiency, Fractures)    Other Visit Diagnoses    Routine general medical examination at a health care facility    -  Primary   Need for immunization against influenza       Relevant Orders   Flu Vaccine QUAD 36+ mos IM (Completed)       [name], MD

## 2019-07-29 NOTE — Assessment & Plan Note (Signed)
Follow cbc.  

## 2019-07-29 NOTE — Assessment & Plan Note (Addendum)
Blood pressure on recheck improved.  Have him spot check his pressure and send in readings.  Continue current medication regimen.  Check metabolic panel.

## 2019-07-29 NOTE — Assessment & Plan Note (Signed)
Continue compression hose.   

## 2019-07-30 ENCOUNTER — Encounter: Payer: Self-pay | Admitting: Internal Medicine

## 2019-07-30 NOTE — Assessment & Plan Note (Signed)
On crestor.  Low cholesterol diet and exercise.  Follow lipid panel and liver function tests.   

## 2019-07-30 NOTE — Assessment & Plan Note (Signed)
Follow vitamin D level.  

## 2019-07-30 NOTE — Assessment & Plan Note (Signed)
Controlled.  

## 2019-07-30 NOTE — Assessment & Plan Note (Signed)
Has open lesion on left lower leg.  Is healing - per pt.  Discussed local wound care.  He declines wound clinic evaluation.  Call with update.

## 2019-07-30 NOTE — Assessment & Plan Note (Signed)
Colonoscopy 06/2015 with tubular adenoma x 2 and hyperplastic polyp removed.  Diverticulosis.  Recommended f/u colonoscopy in 5 years.

## 2019-07-30 NOTE — Assessment & Plan Note (Signed)
Discussed low carb diet and exercise.  Follow met b and a1c.   

## 2019-08-01 LAB — CBC WITH DIFFERENTIAL/PLATELET
Basophils Absolute: 0.1 10*3/uL (ref 0.0–0.2)
Basos: 1 %
EOS (ABSOLUTE): 0.2 10*3/uL (ref 0.0–0.4)
Eos: 3 %
Hematocrit: 43.3 % (ref 37.5–51.0)
Hemoglobin: 14.2 g/dL (ref 13.0–17.7)
Immature Grans (Abs): 0 10*3/uL (ref 0.0–0.1)
Immature Granulocytes: 0 %
Lymphocytes Absolute: 1.7 10*3/uL (ref 0.7–3.1)
Lymphs: 24 %
MCH: 28.7 pg (ref 26.6–33.0)
MCHC: 32.8 g/dL (ref 31.5–35.7)
MCV: 88 fL (ref 79–97)
Monocytes Absolute: 0.6 10*3/uL (ref 0.1–0.9)
Monocytes: 9 %
Neutrophils Absolute: 4.4 10*3/uL (ref 1.4–7.0)
Neutrophils: 63 %
Platelets: 234 10*3/uL (ref 150–450)
RBC: 4.94 x10E6/uL (ref 4.14–5.80)
RDW: 12.9 % (ref 11.6–15.4)
WBC: 6.9 10*3/uL (ref 3.4–10.8)

## 2019-08-01 LAB — TSH: TSH: 1.66 u[IU]/mL (ref 0.450–4.500)

## 2019-08-01 LAB — BASIC METABOLIC PANEL
BUN/Creatinine Ratio: 23 — ABNORMAL HIGH (ref 9–20)
BUN: 20 mg/dL (ref 6–24)
CO2: 25 mmol/L (ref 20–29)
Calcium: 9.1 mg/dL (ref 8.7–10.2)
Chloride: 96 mmol/L (ref 96–106)
Creatinine, Ser: 0.88 mg/dL (ref 0.76–1.27)
GFR calc Af Amer: 109 mL/min/{1.73_m2} (ref >59)
GFR calc non Af Amer: 95 mL/min/{1.73_m2} (ref >59)
Glucose: 302 mg/dL — ABNORMAL HIGH (ref 65–99)
Potassium: 3.6 mmol/L (ref 3.5–5.2)
Sodium: 137 mmol/L (ref 134–144)

## 2019-08-01 LAB — HEPATIC FUNCTION PANEL
ALT: 18 IU/L (ref 0–44)
AST: 17 IU/L (ref 0–40)
Albumin: 4 g/dL (ref 3.8–4.9)
Alkaline Phosphatase: 85 IU/L (ref 39–117)
Bilirubin Total: 0.5 mg/dL (ref 0.0–1.2)
Bilirubin, Direct: 0.13 mg/dL (ref 0.00–0.40)
Total Protein: 6.8 g/dL (ref 6.0–8.5)

## 2019-08-01 LAB — HEMOGLOBIN A1C
Est. average glucose Bld gHb Est-mCnc: 237 mg/dL
Hgb A1c MFr Bld: 9.9 % — ABNORMAL HIGH (ref 4.8–5.6)

## 2019-08-01 LAB — LIPID PANEL
Chol/HDL Ratio: 3.4 ratio (ref 0.0–5.0)
Cholesterol, Total: 140 mg/dL (ref 100–199)
HDL: 41 mg/dL (ref >39)
LDL Chol Calc (NIH): 75 mg/dL (ref 0–99)
Triglycerides: 137 mg/dL (ref 0–149)
VLDL Cholesterol Cal: 24 mg/dL (ref 5–40)

## 2019-08-01 LAB — VITAMIN D 25 HYDROXY (VIT D DEFICIENCY, FRACTURES): Vit D, 25-Hydroxy: 32.1 ng/mL (ref 30.0–100.0)

## 2019-08-02 ENCOUNTER — Encounter: Payer: Self-pay | Admitting: Internal Medicine

## 2019-08-07 ENCOUNTER — Other Ambulatory Visit: Payer: Self-pay | Admitting: Internal Medicine

## 2019-08-24 ENCOUNTER — Other Ambulatory Visit: Payer: Self-pay | Admitting: Internal Medicine

## 2019-08-31 ENCOUNTER — Telehealth: Payer: Self-pay

## 2019-08-31 ENCOUNTER — Telehealth: Payer: Self-pay | Admitting: Internal Medicine

## 2019-08-31 ENCOUNTER — Encounter: Payer: Self-pay | Admitting: Internal Medicine

## 2019-08-31 DIAGNOSIS — I10 Essential (primary) hypertension: Secondary | ICD-10-CM

## 2019-08-31 DIAGNOSIS — E1165 Type 2 diabetes mellitus with hyperglycemia: Secondary | ICD-10-CM | POA: Insufficient documentation

## 2019-08-31 DIAGNOSIS — E7800 Pure hypercholesterolemia, unspecified: Secondary | ICD-10-CM

## 2019-08-31 MED ORDER — METFORMIN HCL 500 MG PO TABS: ORAL_TABLET | ORAL | 1 refills | Status: DC

## 2019-08-31 NOTE — Telephone Encounter (Signed)
Patient said that a good CB# is [phone].

## 2019-08-31 NOTE — Telephone Encounter (Signed)
Tried to call pt.  Had to leave a message.  Sent my chart message.  Sent in rx for metformin.

## 2019-08-31 NOTE — Telephone Encounter (Signed)
Copied from [place] [phone]. Topic: General - Other >> Aug 31, 2019 10:12 AM Carolyn Stare wrote: Pt need a new RX sent to the pharmacy because there was a change in the dosage, dosage change from  2 tablet a day to 4 tablets a day   metFORMIN (GLUCOPHAGE) 500 MG tablet  [address]

## 2019-08-31 NOTE — Telephone Encounter (Signed)
Called pt and left message that rx sent in with new directions.  Also sent pt my chart message that rx sent in for new dose of metformin.

## 2019-08-31 NOTE — Telephone Encounter (Signed)
Patient called back to check on status of Rx for metformin.  Patient said that he only has enough meds to last for tonight.  Patient wants to know if new Rx will be sent to pharmacy with new dosage change.

## 2019-08-31 NOTE — Telephone Encounter (Signed)
Patient requesting call back regarding. Patient has two tablets left.

## 2019-08-31 NOTE — Telephone Encounter (Signed)
The last note I saw on this says 500 mg twice daily patient says should be 1000 mg twice daily.

## 2019-08-31 NOTE — Telephone Encounter (Signed)
rx sent in to [facility] street for 2 tablets bid #360 with one refill.

## 2019-09-03 ENCOUNTER — Other Ambulatory Visit: Payer: Self-pay | Admitting: Internal Medicine

## 2019-09-03 NOTE — Telephone Encounter (Signed)
Left detailed message per DPR advising patient of my chart message and medication called to pharmacy.

## 2019-09-04 NOTE — Telephone Encounter (Signed)
Already addressed.  See other my chart message.   

## 2019-09-17 ENCOUNTER — Other Ambulatory Visit: Payer: Self-pay

## 2019-09-17 ENCOUNTER — Ambulatory Visit: Payer: Managed Care, Other (non HMO) | Admitting: Podiatry

## 2019-09-17 ENCOUNTER — Encounter: Payer: Self-pay | Admitting: Podiatry

## 2019-09-17 DIAGNOSIS — M79675 Pain in left toe(s): Secondary | ICD-10-CM

## 2019-09-17 DIAGNOSIS — B351 Tinea unguium: Secondary | ICD-10-CM | POA: Diagnosis not present

## 2019-09-17 DIAGNOSIS — M79674 Pain in right toe(s): Secondary | ICD-10-CM

## 2019-09-17 DIAGNOSIS — E119 Type 2 diabetes mellitus without complications: Secondary | ICD-10-CM

## 2019-09-17 DIAGNOSIS — I878 Other specified disorders of veins: Secondary | ICD-10-CM | POA: Diagnosis not present

## 2019-09-17 NOTE — Progress Notes (Signed)
Complaint:  Visit Type: Patient returns to my office for continued preventative foot care services. Complaint: Patient states" my nails have grown long and thick and become painful to walk and wear shoes" Patient has been diagnosed with DM with no foot complications. The patient presents for preventative foot care services. No changes to ROS  Podiatric Exam: Vascular: dorsalis pedis and posterior tibial pulses are weakly palpable bilateral. Due to swelling. Capillary return is immediate. Temperature gradient is WNL. Skin turgor WNL .  Venous stasis. Sensorium: Normal Semmes Weinstein monofilament test. Normal tactile sensation bilaterally. Nail Exam: Pt has thick disfigured discolored nails with subungual debris noted bilateral entire nail hallux  Ulcer Exam: There is no evidence of ulcer or pre-ulcerative changes or infection. Orthopedic Exam: Muscle tone and strength are WNL. No limitations in general ROM. No crepitus or effusions noted. Foot type and digits show no abnormalities. Bony prominences are unremarkable. Skin: No Porokeratosis. No infection or ulcers  Diagnosis:  Onychomycosis, , Pain in right toe, pain in left toes  Treatment & Plan Procedures and Treatment: Consent by patient was obtained for treatment procedures.   Debridement of mycotic and hypertrophic toenails, 1 through 5 bilateral and clearing of subungual debris. No ulceration, no infection noted.  Return Visit-Office Procedure: Patient instructed to return to the office for a follow up visit 3 months for continued evaluation and treatment.    Rockland Kotarski DPM 

## 2019-11-06 ENCOUNTER — Other Ambulatory Visit: Payer: Self-pay

## 2019-11-06 ENCOUNTER — Ambulatory Visit (INDEPENDENT_AMBULATORY_CARE_PROVIDER_SITE_OTHER): Payer: Managed Care, Other (non HMO) | Admitting: Internal Medicine

## 2019-11-06 DIAGNOSIS — L97829 Non-pressure chronic ulcer of other part of left lower leg with unspecified severity: Secondary | ICD-10-CM

## 2019-11-06 DIAGNOSIS — E1165 Type 2 diabetes mellitus with hyperglycemia: Secondary | ICD-10-CM

## 2019-11-06 DIAGNOSIS — E7800 Pure hypercholesterolemia, unspecified: Secondary | ICD-10-CM

## 2019-11-06 DIAGNOSIS — R600 Localized edema: Secondary | ICD-10-CM

## 2019-11-06 DIAGNOSIS — I10 Essential (primary) hypertension: Secondary | ICD-10-CM | POA: Diagnosis not present

## 2019-11-06 DIAGNOSIS — I83028 Varicose veins of left lower extremity with ulcer other part of lower leg: Secondary | ICD-10-CM

## 2019-11-06 DIAGNOSIS — D649 Anemia, unspecified: Secondary | ICD-10-CM

## 2019-11-06 DIAGNOSIS — Z800 Family history of malignant neoplasm of digestive organs: Secondary | ICD-10-CM | POA: Diagnosis not present

## 2019-11-06 NOTE — Progress Notes (Signed)
[name], male   DOB: 1960/12/06, 58 y.o.   MRN: [id number]   Virtual Visit via video Note  This visit type was conducted due to national recommendations for restrictions regarding the COVID-19 pandemic (e.g. social distancing).  This format is felt to be most appropriate for this patient at this time.  All issues noted in this document were discussed and addressed.  No physical exam was performed (except for noted visual exam findings with Video Visits).   I connected with [name] by a video enabled telemedicine application and verified that I am speaking with the correct person using two identifiers. Location patient: home Location provider: work  Persons participating in the virtual visit: patient, provider  I discussed the limitations, risks, security and privacy concerns of performing an evaluation and management service by video and the availability of in person appointments.  The patient expressed understanding and agreed to proceed.   Reason for visit: scheduled follow up.   HPI: He reports he is doing better.  Feels better.  Has modified his diet.  Has lost weight.  Breathing better.  More active.  Drinking more water.  Works from home (up stairs).  Going up and down stairs.  No chest pain reported.  Breathing stable.  No abdominal pain or bowel change reported.  Leg lesion - improved.  Swelling better.     ROS: See pertinent positives and negatives per HPI.  Past Medical History:  Diagnosis Date  . 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 Depression   . Environmental allergies   . Family history of colon cancer   . GERD (gastroesophageal reflux disease)   . History of chicken pox   . Hyperlipidemia   . Hypertension   . Venous stasis   . Vitamin D deficiency     Past Surgical History:  Procedure Laterality Date  . CHOLECYSTECTOMY  2002  . COLONOSCOPY WITH  PROPOFOL N/A 06/27/2015   Procedure: COLONOSCOPY WITH PROPOFOL;  Surgeon: Lollie Sails, MD;  Location: [facility] ENDOSCOPY;  Service: Endoscopy;  Laterality: N/A;  . HERNIA REPAIR  1962    Family History  Problem Relation Age of Onset  . Stomach cancer Mother   . Arthritis Mother   . Diabetes Mother   . Hypertension Mother   . Hyperlipidemia Father   . Heart disease Father   . Diabetes Father   . Hypertension Father   . Arthritis Maternal Grandmother   . Hyperlipidemia Maternal Grandmother   . Hypertension Maternal Grandmother   . Arthritis Maternal Grandfather   . Hyperlipidemia Maternal Grandfather   . Heart disease Maternal Grandfather   . Hypertension Maternal Grandfather   . Arthritis Paternal Grandmother   . Breast cancer Paternal Grandmother   . Hypertension Paternal Grandmother   . Arthritis Paternal Grandfather   . Hyperlipidemia Paternal Grandfather   . Heart disease Paternal Grandfather   . Diabetes Paternal Grandfather   . Hypertension Paternal Grandfather   . Diabetes Sister     SOCIAL HX: reviewed.    Current Outpatient Medications:  .  cholecalciferol (VITAMIN D) 1000 units tablet, Take 1,000 Units by mouth daily., Disp: , Rfl:  .  hydrochlorothiazide (HYDRODIURIL) 25 MG tablet, TAKE 1 TABLET(25 MG) BY MOUTH DAILY, Disp: 30 tablet, Rfl: 1 .  lisinopril (ZESTRIL) 40 MG tablet, TAKE 1 TABLET BY MOUTH  DAILY, Disp: 90 tablet, Rfl: 3 .  loratadine (CLARITIN)  10 MG tablet, Take 10 mg by mouth daily as needed for allergies., Disp: , Rfl:  .  metFORMIN (GLUCOPHAGE) 500 MG tablet, Take 2 tablets bid, Disp: 360 tablet, Rfl: 1 .  mupirocin ointment (BACTROBAN) 2 %, Apply to affected area on leg bid, Disp: 22 g, Rfl: 0 .  ranitidine (ZANTAC) 150 MG tablet, Take 150 mg by mouth at bedtime as needed for heartburn., Disp: , Rfl:  .  rosuvastatin (CRESTOR) 10 MG tablet, TAKE 1 TABLET BY MOUTH  DAILY, Disp: 90 tablet, Rfl: 3  EXAM:  GENERAL: alert, oriented, appears well  and in no acute distress  HEENT: atraumatic, conjunttiva clear, no obvious abnormalities on inspection of external nose and ears  NECK: normal movements of the head and neck  LUNGS: on inspection no signs of respiratory distress, breathing rate appears normal, no obvious gross SOB, gasping or wheezing  CV: no obvious cyanosis  PSYCH/NEURO: pleasant and cooperative, no obvious depression or anxiety, speech and thought processing grossly intact  ASSESSMENT AND PLAN:  Discussed the following assessment and plan:  Anemia Follow cbc.   Essential hypertension Have him spot check his pressure.  Continue same medication regimen.  Follow.    Family history of colon cancer Colonoscopy 06/2015 with tubular adenoma x 2 and hyperplastic polyp removed anddiverticulosis.  Recommended f/u colonoscopy in 5 years.    Hypercholesterolemia On crestor.  Low cholesterol diet and exercise.  Follow lipid panel and liver function tests.    Lower extremity edema Improved.  Follow.   Type 2 diabetes mellitus with hyperglycemia (HCC) Low carb diet and exercise.  Follow met b and a1c.  Has adjusted diet.    Venous stasis ulcer Lesion improved.  Follow.     I discussed the assessment and treatment plan with the patient. The patient was provided an opportunity to ask questions and all were answered. The patient agreed with the plan and demonstrated an understanding of the instructions.   The patient was advised to call back or seek an in-person evaluation if the symptoms worsen or if the condition fails to improve as anticipated.   [name], MD

## 2019-11-10 ENCOUNTER — Encounter: Payer: Self-pay | Admitting: Internal Medicine

## 2019-11-10 NOTE — Assessment & Plan Note (Signed)
Low carb diet and exercise.  Follow met b and a1c.  Has adjusted diet.

## 2019-11-10 NOTE — Assessment & Plan Note (Signed)
Lesion improved.  Follow.

## 2019-11-10 NOTE — Assessment & Plan Note (Signed)
Improved.  Follow.  

## 2019-11-10 NOTE — Assessment & Plan Note (Signed)
Have him spot check his pressure.  Continue same medication regimen.  Follow.

## 2019-11-10 NOTE — Assessment & Plan Note (Signed)
On crestor.  Low cholesterol diet and exercise.  Follow lipid panel and liver function tests.   

## 2019-11-10 NOTE — Assessment & Plan Note (Signed)
Follow cbc.  

## 2019-11-10 NOTE — Assessment & Plan Note (Signed)
Colonoscopy 06/2015 with tubular adenoma x 2 and hyperplastic polyp removed anddiverticulosis.  Recommended f/u colonoscopy in 5 years.

## 2019-11-14 ENCOUNTER — Other Ambulatory Visit: Payer: Self-pay | Admitting: Internal Medicine

## 2019-11-14 DIAGNOSIS — E876 Hypokalemia: Secondary | ICD-10-CM

## 2019-11-14 LAB — HEPATIC FUNCTION PANEL
ALT: 17 IU/L (ref 0–44)
AST: 10 IU/L (ref 0–40)
Albumin: 3.8 g/dL (ref 3.8–4.9)
Alkaline Phosphatase: 84 IU/L (ref 39–117)
Bilirubin Total: 0.4 mg/dL (ref 0.0–1.2)
Bilirubin, Direct: 0.1 mg/dL (ref 0.00–0.40)
Total Protein: 6.4 g/dL (ref 6.0–8.5)

## 2019-11-14 LAB — BASIC METABOLIC PANEL
BUN/Creatinine Ratio: 24 — ABNORMAL HIGH (ref 9–20)
BUN: 19 mg/dL (ref 6–24)
CO2: 26 mmol/L (ref 20–29)
Calcium: 9.3 mg/dL (ref 8.7–10.2)
Chloride: 97 mmol/L (ref 96–106)
Creatinine, Ser: 0.8 mg/dL (ref 0.76–1.27)
GFR calc Af Amer: 114 mL/min/{1.73_m2} (ref >59)
GFR calc non Af Amer: 98 mL/min/{1.73_m2} (ref >59)
Glucose: 201 mg/dL — ABNORMAL HIGH (ref 65–99)
Potassium: 3.3 mmol/L — ABNORMAL LOW (ref 3.5–5.2)
Sodium: 140 mmol/L (ref 134–144)

## 2019-11-14 LAB — LIPID PANEL
Chol/HDL Ratio: 3.5 ratio (ref 0.0–5.0)
Cholesterol, Total: 140 mg/dL (ref 100–199)
HDL: 40 mg/dL (ref >39)
LDL Chol Calc (NIH): 75 mg/dL (ref 0–99)
Triglycerides: 142 mg/dL (ref 0–149)
VLDL Cholesterol Cal: 25 mg/dL (ref 5–40)

## 2019-11-14 LAB — HEMOGLOBIN A1C
Est. average glucose Bld gHb Est-mCnc: 194 mg/dL
Hgb A1c MFr Bld: 8.4 % — ABNORMAL HIGH (ref 4.8–5.6)

## 2019-11-14 LAB — MICROALBUMIN / CREATININE URINE RATIO
Creatinine, Urine: 37.1 mg/dL
Microalb/Creat Ratio: 23 mg/g creat (ref 0–29)
Microalbumin, Urine: 8.6 ug/mL

## 2019-11-14 NOTE — Progress Notes (Signed)
Order placed for f/u lab.   

## 2019-11-15 ENCOUNTER — Telehealth: Payer: Self-pay | Admitting: Internal Medicine

## 2019-11-15 DIAGNOSIS — E876 Hypokalemia: Secondary | ICD-10-CM

## 2019-11-15 NOTE — Telephone Encounter (Signed)
Order placed for f/u potassium.  

## 2019-11-17 ENCOUNTER — Other Ambulatory Visit: Payer: Self-pay | Admitting: Internal Medicine

## 2019-11-17 DIAGNOSIS — E1165 Type 2 diabetes mellitus with hyperglycemia: Secondary | ICD-10-CM

## 2019-11-17 NOTE — Progress Notes (Signed)
Order placed for CCM referral.  

## 2019-11-19 ENCOUNTER — Telehealth: Payer: Self-pay | Admitting: Internal Medicine

## 2019-11-19 NOTE — Chronic Care Management (AMB) (Signed)
  Care Management   Note  11/19/2019 Name: BRANDO LICARI MRN: [id number] DOB: 05-Mar-1961  Jolene Schimke Schnebly is a 58 y.o. year old male who is a primary care patient of [name], MD. I reached out to [place] by phone today in response to a referral sent by Mr. Ronelle Attig Vandermeulen's health plan.    Mr. Schiappa was given information about care management services today including:  1. Care management services include personalized support from designated clinical staff supervised by his physician, including individualized plan of care and coordination with other care providers 2. 24/7 contact phone numbers for assistance for urgent and routine care needs. 3. The patient may stop care management services at any time by phone call to the office staff.  Patient agreed to services and verbal consent obtained.   Follow up plan: Telephone appointment with CM team member scheduled for:12/24/2019  Glenna Durand, [place] [facility] ??[email or web address] ??[phone]

## 2019-12-11 ENCOUNTER — Telehealth: Payer: Self-pay | Admitting: Internal Medicine

## 2019-12-11 NOTE — Telephone Encounter (Signed)
Pt needs a refill on hydrochlorothiazide (HYDRODIURIL) 25 MG tablet. Qty-90 Pt does not have any left. Please send to [address]

## 2019-12-12 ENCOUNTER — Other Ambulatory Visit: Payer: Self-pay

## 2019-12-12 MED ORDER — HYDROCHLOROTHIAZIDE 25 MG PO TABS: ORAL_TABLET | ORAL | 3 refills | Status: DC

## 2019-12-12 NOTE — Telephone Encounter (Signed)
Rx sent in for 90 day supply

## 2019-12-17 ENCOUNTER — Ambulatory Visit: Payer: Managed Care, Other (non HMO) | Admitting: Podiatry

## 2019-12-17 ENCOUNTER — Encounter: Payer: Self-pay | Admitting: Podiatry

## 2019-12-17 ENCOUNTER — Other Ambulatory Visit: Payer: Self-pay

## 2019-12-17 DIAGNOSIS — M79675 Pain in left toe(s): Secondary | ICD-10-CM

## 2019-12-17 DIAGNOSIS — I878 Other specified disorders of veins: Secondary | ICD-10-CM

## 2019-12-17 DIAGNOSIS — E119 Type 2 diabetes mellitus without complications: Secondary | ICD-10-CM

## 2019-12-17 DIAGNOSIS — B351 Tinea unguium: Secondary | ICD-10-CM

## 2019-12-17 DIAGNOSIS — M79674 Pain in right toe(s): Secondary | ICD-10-CM

## 2019-12-17 NOTE — Progress Notes (Signed)
Complaint:  Visit Type: Patient returns to my office for continued preventative foot care services. Complaint: Patient states" my nails have grown long and thick and become painful to walk and wear shoes" Patient has been diagnosed with DM with no foot complications. The patient presents for preventative foot care services. No changes to ROS  Podiatric Exam: Vascular: dorsalis pedis and posterior tibial pulses are weakly palpable bilateral. Due to swelling. Capillary return is immediate. Temperature gradient is WNL. Skin turgor WNL .  Venous stasis  right leg no longer draining. Sensorium: Normal Semmes Weinstein monofilament test. Normal tactile sensation bilaterally. Nail Exam: Pt has thick disfigured discolored nails with subungual debris noted bilateral entire nail hallux  Ulcer Exam: There is no evidence of ulcer or pre-ulcerative changes or infection. Orthopedic Exam: Muscle tone and strength are WNL. No limitations in general ROM. No crepitus or effusions noted. Foot type and digits show no abnormalities. Bony prominences are unremarkable. Skin: No Porokeratosis. No infection or ulcers  Diagnosis:  Onychomycosis, , Pain in right toe, pain in left toes  Treatment & Plan Procedures and Treatment: Consent by patient was obtained for treatment procedures.   Debridement of mycotic and hypertrophic toenails, 1 through 5 bilateral and clearing of subungual debris. No ulceration, no infection noted.  Return Visit-Office Procedure: Patient instructed to return to the office for a follow up visit 3 months for continued evaluation and treatment.    Gardiner Barefoot DPM

## 2019-12-24 ENCOUNTER — Ambulatory Visit: Payer: Managed Care, Other (non HMO) | Admitting: Pharmacist

## 2019-12-24 ENCOUNTER — Encounter: Payer: Self-pay | Admitting: Pharmacist

## 2019-12-24 DIAGNOSIS — I10 Essential (primary) hypertension: Secondary | ICD-10-CM

## 2019-12-24 DIAGNOSIS — E1165 Type 2 diabetes mellitus with hyperglycemia: Secondary | ICD-10-CM

## 2019-12-24 MED ORDER — FREESTYLE LIBRE 14 DAY SENSOR MISC: 1.0000 | 3 refills | Status: DC

## 2019-12-24 NOTE — Progress Notes (Signed)
Reviewed information.  Agree with plan.    Dr Diannie Willner 

## 2019-12-24 NOTE — Chronic Care Management (AMB) (Signed)
Chronic Care Management   Note  12/24/2019 Name: [name] MRN: [id number] DOB: 07-Jun-1961   Subjective:  [name] is a 59 y.o. year old male who is a primary care patient of [name], MD. The CCM team was consulted for assistance with chronic disease management and care coordination needs.    Contacted patient for medication management review.   Review of patient status, including review of consultants reports, laboratory and other test data, was performed as part of comprehensive evaluation and provision of chronic care management services.   Objective:  Lab Results  Component Value Date   CREATININE 0.80 11/13/2019   CREATININE 0.88 07/31/2019   CREATININE 0.84 12/18/2018    Lab Results  Component Value Date   HGBA1C 8.4 (H) 11/13/2019       Component Value Date/Time   CHOL 140 11/13/2019 0837   TRIG 142 11/13/2019 0837   HDL 40 11/13/2019 0837   CHOLHDL 3.5 11/13/2019 0837   LDLCALC 75 11/13/2019 0837    Clinical ASCVD: No  The 10-year ASCVD risk score [name] DC Jr., et al., 2013) is: 16.1%   Values used to calculate the score:     Age: 67 years     Sex: Male     Is Non-Hispanic African American: No     Diabetic: Yes     Tobacco smoker: No     Systolic Blood Pressure: 123456 mmHg     Is BP treated: Yes     HDL Cholesterol: 40 mg/dL     Total Cholesterol: 140 mg/dL    BP Readings from Last 3 Encounters:  11/06/19 (!) 144/82  07/29/19 134/82  01/30/19 (!) 168/88    No Known Allergies  Medications Reviewed Today    Reviewed by Gardiner Barefoot, DPM (Physician) on 12/17/19 at [phone]  Med List Status: <None>  Medication Order Taking? Sig Documenting Provider Last Dose Status Informant  cholecalciferol (VITAMIN D) 1000 units tablet [id number] Yes Take 1,000 Units by mouth daily. [provider] Taking Active Self  hydrochlorothiazide (HYDRODIURIL) 25 MG tablet [id number] Yes TAKE 1 TABLET(25 MG) BY MOUTH DAILY [name], MD Taking  Active   lisinopril (ZESTRIL) 40 MG tablet [id number] Yes TAKE 1 TABLET BY MOUTH  DAILY [name], MD Taking Active   loratadine (CLARITIN) 10 MG tablet [id number] Yes Take 10 mg by mouth daily as needed for allergies. [provider] Taking Active   metFORMIN (GLUCOPHAGE) 500 MG tablet [id number] Yes Take 2 tablets bid [name], MD Taking Active   mupirocin ointment (BACTROBAN) 2 % [id number] Yes Apply to affected area on leg bid [name], MD Taking Active   ranitidine (ZANTAC) 150 MG tablet [id number] Yes Take 150 mg by mouth at bedtime as needed for heartburn. [provider] Taking Active   rosuvastatin (CRESTOR) 10 MG tablet [id number] Yes TAKE 1 TABLET BY MOUTH  DAILY [name], MD Taking Active            Assessment:   Goals Addressed            This Visit's Progress     Patient Stated   . PharmD "I want to work on my sugars" (pt-stated)       Current Barriers:  . Diabetes: uncontrolled; complicated by chronic medical conditions including HTN, HLD, most recent A1c 8.4% o Notes he has been working hard at adjusting meal intake to improve his sugars. Has been reducing carbs.  . Current antihyperglycemic regimen: metformin 1000 mg BID .  Reports improvement in healing on a leg wound . Current meal patterns: o Breakfast: ham, [place] wrap w/ low carb wrap; eggs; V8 juice o Notes he loves cold vegetables, but doesn't like hot vegetables as much, so prefers high carb choices  o Drinks: 4-5 diet drinks; 4-5 servings; 1 pitcher unsweet tea  . Current exercise: none besides walking around the house;  Marland Kitchen Current blood glucose readings: not checking BG currently, notes he doesn't like fingersticks . Cardiovascular risk reduction: o Current hypertensive regimen: HCTZ 25 mg daily, lisinopril 40 mg daily o Current hyperlipidemia regimen: rosuvastatin 10 mg daily; last LDL at goal <100  Pharmacist Clinical Goal(s):  Marland Kitchen Over the next 90 days, patient  will work with PharmD and primary care provider to address optimized medication management  Interventions: . Comprehensive medication review performed, medication list updated in electronic medical record . Reviewed importance of checking blood sugars to help determine current glucose control. Discussed FreeStyle Libre. Sent prescription to pharmacy to see if this is covered by his insurance. Copay is ~$180/84 supply, and patient is amenable to this. Counseled on placement, scanning at least Q6H.  Marland Kitchen Reviewed goal A1c, goal fasting glucose, and goal 2 hour post prandial glucose . Extensive discussion regarding SGLT2 and GLP1, including mechanism of action, side effects, and benefits (ASCVD risk reduction, weight loss w/ GLP1, renal protection and potential impact on LEE w/ SGLT2). Patient declines to add an additional medication at this time, as he is confident in improvement in sugars upon next A1c check. Encouraged glucose checks to determine current level of control. If A1c not at goal upon next check, recommend addition of GLP1 or SGLT2, pending patient preference.   Patient Self Care Activities:  . Patient will check blood glucose BID, document, and provide at future appointment . Patient will take medications as prescribed . Patient will report any questions or concerns to provider   Initial goal documentation        Plan: - Scheduled f/u call 01/21/20  [name], PharmD, BCACP, [place] Pharmacist [place] [phone]

## 2019-12-24 NOTE — Patient Instructions (Signed)
Visit Information  Goals Addressed            This Visit's Progress     Patient Stated   . PharmD "I want to work on my sugars" (pt-stated)       Current Barriers:  . Diabetes: uncontrolled; complicated by chronic medical conditions including HTN, HLD, most recent A1c 8.4% o Notes he has been working hard at adjusting meal intake to improve his sugars. Has been reducing carbs.  . Current antihyperglycemic regimen: metformin 1000 mg BID . Reports improvement in healing on a leg wound . Current meal patterns: o Breakfast: ham, [place] wrap w/ low carb wrap; eggs; V8 juice o Notes he loves cold vegetables, but doesn't like hot vegetables as much, so prefers high carb choices  o Drinks: 4-5 diet drinks; 4-5 servings; 1 pitcher unsweet tea  . Current exercise: none besides walking around the house;  Marland Kitchen Current blood glucose readings: not checking BG currently, notes he doesn't like fingersticks . Cardiovascular risk reduction: o Current hypertensive regimen: HCTZ 25 mg daily, lisinopril 40 mg daily o Current hyperlipidemia regimen: rosuvastatin 10 mg daily; last LDL at goal <100  Pharmacist Clinical Goal(s):  Marland Kitchen Over the next 90 days, patient will work with PharmD and primary care provider to address optimized medication management  Interventions: . Comprehensive medication review performed, medication list updated in electronic medical record . Reviewed importance of checking blood sugars to help determine current glucose control. Discussed FreeStyle Libre. Sent prescription to pharmacy to see if this is covered by his insurance. Copay is ~$180/84 supply, and patient is amenable to this. Counseled on placement, scanning at least Q6H.  Marland Kitchen Reviewed goal A1c, goal fasting glucose, and goal 2 hour post prandial glucose . Extensive discussion regarding SGLT2 and GLP1, including mechanism of action, side effects, and benefits (ASCVD risk reduction, weight loss w/ GLP1, renal protection and  potential impact on LEE w/ SGLT2). Patient declines to add an additional medication at this time, as he is confident in improvement in sugars upon next A1c check. Encouraged glucose checks to determine current level of control. If A1c not at goal upon next check, recommend addition of GLP1 or SGLT2, pending patient preference.   Patient Self Care Activities:  . Patient will check blood glucose BID, document, and provide at future appointment . Patient will take medications as prescribed . Patient will report any questions or concerns to provider   Initial goal documentation        The patient verbalized understanding of instructions provided today and declined a print copy of patient instruction materials.   Plan:  - Scheduled f/u call 01/21/20  [name], PharmD, BCACP, CPP  Clinical Pharmacist  [place]  [phone]

## 2020-01-21 ENCOUNTER — Ambulatory Visit: Payer: Self-pay | Admitting: Pharmacist

## 2020-01-21 ENCOUNTER — Telehealth: Payer: Managed Care, Other (non HMO)

## 2020-01-21 DIAGNOSIS — E1165 Type 2 diabetes mellitus with hyperglycemia: Secondary | ICD-10-CM

## 2020-01-21 NOTE — Chronic Care Management (AMB) (Signed)
Chronic Care Management   Follow Up Note   01/21/2020 Name: [name] MRN: [id number] DOB: Mar 20, 1961  Referred by: [name], MD Reason for referral : Chronic Care Management (Medication Management)   [name] is a 59 y.o. year old male who is a primary care patient of [name], MD. The CCM team was consulted for assistance with chronic disease management and care coordination needs.    Contacted patient for medication management review.   Review of patient status, including review of consultants reports, relevant laboratory and other test results, and collaboration with appropriate care team members and the patient's provider was performed as part of comprehensive patient evaluation and provision of chronic care management services.    SDOH (Social Determinants of Health) assessments performed: No See Care Plan activities for detailed interventions related to [facility])     Outpatient Encounter Medications as of 01/21/2020  Medication Sig  . cholecalciferol (VITAMIN D) 1000 units tablet Take 1,000 Units by mouth daily.  . Continuous Blood Gluc Sensor (FREESTYLE LIBRE 14 DAY SENSOR) MISC Inject 1 Device into the skin every 14 (fourteen) days.  . hydrochlorothiazide (HYDRODIURIL) 25 MG tablet TAKE 1 TABLET(25 MG) BY MOUTH DAILY  . lisinopril (ZESTRIL) 40 MG tablet TAKE 1 TABLET BY MOUTH  DAILY  . loratadine (CLARITIN) 10 MG tablet Take 10 mg by mouth daily as needed for allergies.  . metFORMIN (GLUCOPHAGE) 500 MG tablet Take 2 tablets bid  . mupirocin ointment (BACTROBAN) 2 % Apply to affected area on leg bid  . ranitidine (ZANTAC) 150 MG tablet Take 150 mg by mouth at bedtime as needed for heartburn.  . rosuvastatin (CRESTOR) 10 MG tablet TAKE 1 TABLET BY MOUTH  DAILY   No facility-administered encounter medications on file as of 01/21/2020.     Objective:   Goals Addressed            This Visit's Progress     Patient Stated   . PharmD "I want to work  on my sugars" (pt-stated)       [place] (see longtitudinal plan of care for additional care plan information)  Current Barriers:  . Diabetes: uncontrolled; complicated by chronic medical conditions including HTN, HLD, most recent A1c 8.4% o Notes he cannot talk for long today d/t being slammed a twork o Notes he was able to pick up FreeStyle Libre CGM, but has not started using yet. Has not been checking BG. . Current antihyperglycemic regimen: metformin 1000 mg BID . Current meal patterns: o Continues to minimize carbohydrates . Current exercise: nothing formal . Current blood glucose readings: not checking, has not set up [employer] yet. . Cardiovascular risk reduction: o Current hypertensive regimen: HCTZ 25 mg daily, lisinopril 40 mg daily o Current hyperlipidemia regimen: rosuvastatin 10 mg daily; last LDL at goal <100  Pharmacist Clinical Goal(s):  Marland Kitchen Over the next 90 days, patient will work with PharmD and primary care provider to address optimized medication management  Interventions: . Reviewed importance of blood sugar checks. Encouraged patient to start using FreeStyle Libre to check sugar. Will sent link for patient's account to connect to my LibreView account so that I can view readings remotely and share w/ PCP . Pending A1c result, recommend GLP1 vs SGLT2, as per our previous discussion  Patient Self Care Activities:  . Patient will check blood glucose using CGM at least QID, and provide at future appointment . Patient will take medications as prescribed . Patient will report any  questions or concerns to provider   Please see past updates related to this goal by clicking on the "Past Updates" button in the selected goal          Plan:  - Scheduled f/u call 02/21/20  [name], PharmD, BCACP, [place] Pharmacist [place] [phone]

## 2020-01-21 NOTE — Patient Instructions (Signed)
Visit Information  Goals Addressed            This Visit's Progress     Patient Stated   . PharmD "I want to work on my sugars" (pt-stated)       [place] (see longtitudinal plan of care for additional care plan information)  Current Barriers:  . Diabetes: uncontrolled; complicated by chronic medical conditions including HTN, HLD, most recent A1c 8.4% o Notes he cannot talk for long today d/t being slammed a twork o Notes he was able to pick up FreeStyle Libre CGM, but has not started using yet. Has not been checking BG. . Current antihyperglycemic regimen: metformin 1000 mg BID . Current meal patterns: o Continues to minimize carbohydrates . Current exercise: nothing formal . Current blood glucose readings: not checking, has not set up [employer] yet. . Cardiovascular risk reduction: o Current hypertensive regimen: HCTZ 25 mg daily, lisinopril 40 mg daily o Current hyperlipidemia regimen: rosuvastatin 10 mg daily; last LDL at goal <100  Pharmacist Clinical Goal(s):  Marland Kitchen Over the next 90 days, patient will work with PharmD and primary care provider to address optimized medication management  Interventions: . Reviewed importance of blood sugar checks. Encouraged patient to start using FreeStyle Libre to check sugar. Will sent link for patient's account to connect to my LibreView account so that I can view readings remotely and share w/ PCP . Pending A1c result, recommend GLP1 vs SGLT2, as per our previous discussion  Patient Self Care Activities:  . Patient will check blood glucose using CGM at least QID, and provide at future appointment . Patient will take medications as prescribed . Patient will report any questions or concerns to provider   Please see past updates related to this goal by clicking on the "Past Updates" button in the selected goal         Patient verbalizes understanding of instructions provided today.   Plan:  - Scheduled f/u call  02/21/20  [name], PharmD, BCACP, CPP Clinical Pharmacist [place] [phone]

## 2020-01-22 NOTE — Progress Notes (Signed)
Reviewed above information.  Agree with assessment and plan.    Dr Ronan Dion 

## 2020-02-12 ENCOUNTER — Telehealth (INDEPENDENT_AMBULATORY_CARE_PROVIDER_SITE_OTHER): Payer: Managed Care, Other (non HMO) | Admitting: Internal Medicine

## 2020-02-12 ENCOUNTER — Other Ambulatory Visit: Payer: Self-pay

## 2020-02-12 DIAGNOSIS — I872 Venous insufficiency (chronic) (peripheral): Secondary | ICD-10-CM

## 2020-02-12 DIAGNOSIS — E7800 Pure hypercholesterolemia, unspecified: Secondary | ICD-10-CM | POA: Diagnosis not present

## 2020-02-12 DIAGNOSIS — I10 Essential (primary) hypertension: Secondary | ICD-10-CM

## 2020-02-12 DIAGNOSIS — D649 Anemia, unspecified: Secondary | ICD-10-CM

## 2020-02-12 DIAGNOSIS — L989 Disorder of the skin and subcutaneous tissue, unspecified: Secondary | ICD-10-CM

## 2020-02-12 DIAGNOSIS — R600 Localized edema: Secondary | ICD-10-CM

## 2020-02-12 DIAGNOSIS — E1351 Other specified diabetes mellitus with diabetic peripheral angiopathy without gangrene: Secondary | ICD-10-CM

## 2020-02-12 DIAGNOSIS — E1165 Type 2 diabetes mellitus with hyperglycemia: Secondary | ICD-10-CM | POA: Diagnosis not present

## 2020-02-12 DIAGNOSIS — E559 Vitamin D deficiency, unspecified: Secondary | ICD-10-CM

## 2020-02-12 MED ORDER — MUPIROCIN 2 % EX OINT: TOPICAL_OINTMENT | CUTANEOUS | 0 refills | Status: DC

## 2020-02-12 NOTE — Progress Notes (Signed)
Patient ID: [name], male   DOB: [birth year], 59 y.o.   MRN: [id number]   Virtual Visit via video Note  This visit type was conducted due to national recommendations for restrictions regarding the COVID-19 pandemic (e.g. social distancing).  This format is felt to be most appropriate for this patient at this time.  All issues noted in this document were discussed and addressed.  No physical exam was performed (except for noted visual exam findings with Video Visits).   I connected with [name] by a video enabled telemedicine application and verified that I am speaking with the correct person using two identifiers. Location patient: home Location provider: work Persons participating in the virtual visit: patient, provider  The limitations, risks, security and privacy concerns of performing an evaluation and management service by video and the availability of in person appointments have been discussed. The patient expressed understanding and agreed to proceed.   Reason for visit:  Scheduled follow up.   HPI: Had noticed some increased leg swelling recently.  Last two weeks, has slept more.  Feels better.  Leg swelling is better.  Still working from home.  No chest pain or sob reported.  No acid reflux or abdominal pain reported.  Has a place on left leg - request bactroban, but overall legs doing better.  Sometimes misses a dose of metformin.  Discussed diet and exercise.  Weight is down.  Drinking more water.  Blood pressure checks:  140/80s.  No bowel issues reported.     ROS: See pertinent positives and negatives per HPI.  Past Medical History:  Diagnosis Date  . 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 Depression   . Environmental allergies   . Family history of colon cancer   . GERD (gastroesophageal reflux disease)   . History of chicken pox   . Hyperlipidemia   . Hypertension   .  Venous stasis   . Vitamin D deficiency     Past Surgical History:  Procedure Laterality Date  . CHOLECYSTECTOMY  2002  . COLONOSCOPY WITH PROPOFOL N/A 06/27/2015   Procedure: COLONOSCOPY WITH PROPOFOL;  Surgeon: Lollie Sails, MD;  [facility];  Service: Endoscopy;  Laterality: N/A;  . HERNIA REPAIR  1962    Family History  Problem Relation Age of Onset  . Stomach cancer Mother   . Arthritis Mother   . Diabetes Mother   . Hypertension Mother   . Hyperlipidemia Father   . Heart disease Father   . Diabetes Father   . Hypertension Father   . Arthritis Maternal Grandmother   . Hyperlipidemia Maternal Grandmother   . Hypertension Maternal Grandmother   . Arthritis Maternal Grandfather   . Hyperlipidemia Maternal Grandfather   . Heart disease Maternal Grandfather   . Hypertension Maternal Grandfather   . Arthritis Paternal Grandmother   . Breast cancer Paternal Grandmother   . Hypertension Paternal Grandmother   . Arthritis Paternal Grandfather   . Hyperlipidemia Paternal Grandfather   . Heart disease Paternal Grandfather   . Diabetes Paternal Grandfather   . Hypertension Paternal Grandfather   . Diabetes Sister     SOCIAL HX: reviewed.    Current Outpatient Medications:  .  cholecalciferol (VITAMIN D) 1000 units tablet, Take 1,000 Units by mouth daily., Disp: , Rfl:  .  Continuous Blood Gluc Sensor (FREESTYLE LIBRE 14 DAY SENSOR) MISC, Inject 1 Device into the skin  every 14 (fourteen) days., Disp: 6 each, Rfl: 3 .  hydrochlorothiazide (HYDRODIURIL) 25 MG tablet, TAKE 1 TABLET(25 MG) BY MOUTH DAILY, Disp: 90 tablet, Rfl: 3 .  lisinopril (ZESTRIL) 40 MG tablet, TAKE 1 TABLET BY MOUTH  DAILY, Disp: 90 tablet, Rfl: 3 .  loratadine (CLARITIN) 10 MG tablet, Take 10 mg by mouth daily as needed for allergies., Disp: , Rfl:  .  metFORMIN (GLUCOPHAGE) 500 MG tablet, Take 2 tablets bid, Disp: 360 tablet, Rfl: 1 .  mupirocin ointment (BACTROBAN) 2 %, Apply to affected  area on leg bid, Disp: 22 g, Rfl: 0 .  ranitidine (ZANTAC) 150 MG tablet, Take 150 mg by mouth at bedtime as needed for heartburn., Disp: , Rfl:  .  rosuvastatin (CRESTOR) 10 MG tablet, TAKE 1 TABLET BY MOUTH  DAILY, Disp: 90 tablet, Rfl: 3  EXAM:  GENERAL: alert, oriented, appears well and in no acute distress  HEENT: atraumatic, conjunttiva clear, no obvious abnormalities on inspection of external nose and ears  NECK: normal movements of the head and neck  LUNGS: on inspection no signs of respiratory distress, breathing rate appears normal, no obvious gross SOB, gasping or wheezing  CV: no obvious cyanosis  PSYCH/NEURO: pleasant and cooperative, no obvious depression or anxiety, speech and thought processing grossly intact  ASSESSMENT AND PLAN:  Discussed the following assessment and plan:  Anemia Follow cbc.  Last check wnl.   Chronic venous insufficiency Continue compression hose and leg elevation.  Follow. Improved.   Essential hypertension Continue lisinopril and hctz.  Spot check pressure.  Losing weight.  Adjusting diet.  Follow pressures and follow metabolic panel.   Hypercholesterolemia On crestor.  Low cholesterol diet and exercise.  Follow lipid panel and liver function tests.    Lower extremity edema Improved.  Continue compression hose.    Secondary diabetes with peripheral circulatory disorder Low carb diet and exercise.  Follow met b and a1c.    Type 2 diabetes mellitus with hyperglycemia (HCC) Low carb diet and exercise.  Follow met b and a1c.  Has adjusted his diet.  Drinking water.  Has lost weight.  Follow.   Vitamin D deficiency Follow vitamin D level.   Skin lesion Lesion on leg.  Overall legs doing better.  Request refill bactroban.  Follow.    Orders Placed This Encounter  Procedures  . Hemoglobin A1c  . Hepatic function panel  . Lipid panel  . Basic metabolic panel    Meds ordered this encounter  Medications  . mupirocin ointment  (BACTROBAN) 2 %    Sig: Apply to affected area on leg bid    Dispense:  22 g    Refill:  0     I discussed the assessment and treatment plan with the patient. The patient was provided an opportunity to ask questions and all were answered. The patient agreed with the plan and demonstrated an understanding of the instructions.   The patient was advised to call back or seek an in-person evaluation if the symptoms worsen or if the condition fails to improve as anticipated.    [name], MD

## 2020-02-17 ENCOUNTER — Encounter: Payer: Self-pay | Admitting: Internal Medicine

## 2020-02-17 DIAGNOSIS — R21 Rash and other nonspecific skin eruption: Secondary | ICD-10-CM | POA: Insufficient documentation

## 2020-02-17 DIAGNOSIS — L989 Disorder of the skin and subcutaneous tissue, unspecified: Secondary | ICD-10-CM | POA: Insufficient documentation

## 2020-02-17 NOTE — Assessment & Plan Note (Signed)
Follow cbc.  Last check wnl.

## 2020-02-17 NOTE — Assessment & Plan Note (Signed)
Follow vitamin D level.  

## 2020-02-17 NOTE — Assessment & Plan Note (Signed)
Continue compression hose and leg elevation.  Follow. Improved.

## 2020-02-17 NOTE — Assessment & Plan Note (Signed)
Low carb diet and exercise.  Follow met b and a1c.   

## 2020-02-17 NOTE — Assessment & Plan Note (Signed)
Lesion on leg.  Overall legs doing better.  Request refill bactroban.  Follow.

## 2020-02-17 NOTE — Assessment & Plan Note (Signed)
Continue lisinopril and hctz.  Spot check pressure.  Losing weight.  Adjusting diet.  Follow pressures and follow metabolic panel.

## 2020-02-17 NOTE — Assessment & Plan Note (Signed)
Improved.  Continue compression hose.  

## 2020-02-17 NOTE — Assessment & Plan Note (Signed)
On crestor.  Low cholesterol diet and exercise.  Follow lipid panel and liver function tests.   

## 2020-02-17 NOTE — Assessment & Plan Note (Signed)
Low carb diet and exercise.  Follow met b and a1c.  Has adjusted his diet.  Drinking water.  Has lost weight.  Follow.

## 2020-02-20 ENCOUNTER — Other Ambulatory Visit: Payer: Self-pay | Admitting: Internal Medicine

## 2020-02-21 ENCOUNTER — Ambulatory Visit: Payer: Managed Care, Other (non HMO) | Admitting: Pharmacist

## 2020-02-21 DIAGNOSIS — E1351 Other specified diabetes mellitus with diabetic peripheral angiopathy without gangrene: Secondary | ICD-10-CM

## 2020-02-21 DIAGNOSIS — Z6841 Body Mass Index (BMI) 40.0 and over, adult: Secondary | ICD-10-CM

## 2020-02-21 NOTE — Patient Instructions (Signed)
Visit Information  Goals Addressed            This Visit's Progress     Patient Stated   . PharmD "I want to work on my sugars" (pt-stated)       [name] (see longtitudinal plan of care for additional care plan information)  Current Barriers:  . Diabetes: uncontrolled; complicated by chronic medical conditions including HTN, HLD, most recent A1c 8.4%; overdue for follow up, but labs ordered. Notes there has been stress at work lately w/ a coworker quitting so he hasn't been able to make time to come by for lab work  . Current antihyperglycemic regimen: metformin 1000 mg BID . Current dietary habits o Has cut back on carbohydrate serving sizes and choices. Continues to minimize sugary drinks . Current blood glucose readings: not checking, had not set up [place] yet  . Cardiovascular risk reduction: o Current hypertensive regimen: HCTZ 25 mg daily, lisinopril 40 mg daily o Current hyperlipidemia regimen: rosuvastatin 10 mg daily; last LDL at goal <100  Pharmacist Clinical Goal(s):  Marland Kitchen Over the next 90 days, patient will work with PharmD and primary care provider to address optimized medication management  Interventions: . Talked patient through [place] CGM set up. Sent LibreView link. Patient's sensor will become active in ~60 minutes. Counseled to scan at least Q6H to download all data for eventual review. He verbalizes understanding.  . Reiterated importance of coming in for lab work as ordered by Dr. Nicki Reaper.   Patient Self Care Activities:  . Patient will check blood glucose using CGM at least QID, and provide at future appointment . Patient will take medications as prescribed . Patient will report any questions or concerns to provider   Please see past updates related to this goal by clicking on the "Past Updates" button in the selected goal         Patient verbalizes understanding of instructions provided today.    Plan:  - Scheduled f/u call  04/10/20  [name], PharmD, BCACP, CPP Clinical Pharmacist [place] [phone]

## 2020-02-21 NOTE — Chronic Care Management (AMB) (Signed)
Chronic Care Management   Follow Up Note   02/21/2020 Name: [name] MRN: [id number] DOB: 1961/02/16  Referred by: [name], MD Reason for referral : Chronic Care Management (Medication Management)   [name] is a 59 y.o. year old male who is a primary care patient of [name], MD. The CCM team was consulted for assistance with chronic disease management and care coordination needs.    Contacted patient for medication management review.   Review of patient status, including review of consultants reports, relevant laboratory and other test results, and collaboration with appropriate care team members and the patient's provider was performed as part of comprehensive patient evaluation and provision of chronic care management services.    SDOH (Social Determinants of Health) assessments performed: Yes See Care Plan activities for detailed interventions related to [facility])     Outpatient Encounter Medications as of 02/21/2020  Medication Sig  . cholecalciferol (VITAMIN D) 1000 units tablet Take 1,000 Units by mouth daily.  . Continuous Blood Gluc Sensor (FREESTYLE LIBRE 14 DAY SENSOR) MISC Inject 1 Device into the skin every 14 (fourteen) days.  . hydrochlorothiazide (HYDRODIURIL) 25 MG tablet TAKE 1 TABLET(25 MG) BY MOUTH DAILY  . lisinopril (ZESTRIL) 40 MG tablet TAKE 1 TABLET BY MOUTH  DAILY  . loratadine (CLARITIN) 10 MG tablet Take 10 mg by mouth daily as needed for allergies.  . metFORMIN (GLUCOPHAGE) 500 MG tablet TAKE 2 TABLETS BY MOUTH TWICE DAILY  . mupirocin ointment (BACTROBAN) 2 % Apply to affected area on leg bid  . ranitidine (ZANTAC) 150 MG tablet Take 150 mg by mouth at bedtime as needed for heartburn.  . rosuvastatin (CRESTOR) 10 MG tablet TAKE 1 TABLET BY MOUTH  DAILY   No facility-administered encounter medications on file as of 02/21/2020.     Objective:   Goals Addressed            This Visit's Progress     Patient Stated   .  PharmD "I want to work on my sugars" (pt-stated)       [place] (see longtitudinal plan of care for additional care plan information)  Current Barriers:  . Diabetes: uncontrolled; complicated by chronic medical conditions including HTN, HLD, most recent A1c 8.4%; overdue for follow up, but labs ordered. Notes there has been stress at work lately w/ a coworker quitting so he hasn't been able to make time to come by for lab work  . Current antihyperglycemic regimen: metformin 1000 mg BID . Current dietary habits o Has cut back on carbohydrate serving sizes and choices. Continues to minimize sugary drinks . Current blood glucose readings: not checking, had not set up [place] yet  . Cardiovascular risk reduction: o Current hypertensive regimen: HCTZ 25 mg daily, lisinopril 40 mg daily o Current hyperlipidemia regimen: rosuvastatin 10 mg daily; last LDL at goal <100  Pharmacist Clinical Goal(s):  Marland Kitchen Over the next 90 days, patient will work with PharmD and primary care provider to address optimized medication management  Interventions: . Talked patient through [place] CGM set up. Sent Lib[employer]View link. Patient's sensor will become active in ~60 minutes. Counseled to scan at least Q6H to download all data for eventual review. He verbalizes understanding.  . Reiterated importance of coming in for lab work as ordered by Dr. Nicki Reaper.   Patient Self Care Activities:  . Patient will check blood glucose using CGM at least QID, and provide at future appointment . Patient will take medications as  prescribed . Patient will report any questions or concerns to provider   Please see past updates related to this goal by clicking on the "Past Updates" button in the selected goal          Plan:  - Scheduled f/u call 04/10/20  [name], PharmD, BCACP, [place] Pharmacist [place] [phone]

## 2020-02-22 NOTE — Progress Notes (Signed)
Reviewed information.  Agree with plan.    Dr Allysen Lazo 

## 2020-02-23 ENCOUNTER — Encounter: Payer: Self-pay | Admitting: Internal Medicine

## 2020-02-23 LAB — BASIC METABOLIC PANEL
BUN/Creatinine Ratio: 17 (ref 9–20)
BUN: 16 mg/dL (ref 6–24)
CO2: 28 mmol/L (ref 20–29)
Calcium: 9.8 mg/dL (ref 8.7–10.2)
Chloride: 98 mmol/L (ref 96–106)
Creatinine, Ser: 0.94 mg/dL (ref 0.76–1.27)
GFR calc Af Amer: 103 mL/min/{1.73_m2} (ref >59)
GFR calc non Af Amer: 89 mL/min/{1.73_m2} (ref >59)
Glucose: 191 mg/dL — ABNORMAL HIGH (ref 65–99)
Potassium: 3.5 mmol/L (ref 3.5–5.2)
Sodium: 141 mmol/L (ref 134–144)

## 2020-02-23 LAB — LIPID PANEL
Chol/HDL Ratio: 3.4 ratio (ref 0.0–5.0)
Cholesterol, Total: 127 mg/dL (ref 100–199)
HDL: 37 mg/dL — ABNORMAL LOW (ref >39)
LDL Chol Calc (NIH): 71 mg/dL (ref 0–99)
Triglycerides: 102 mg/dL (ref 0–149)
VLDL Cholesterol Cal: 19 mg/dL (ref 5–40)

## 2020-02-23 LAB — HEPATIC FUNCTION PANEL
ALT: 15 IU/L (ref 0–44)
AST: 16 IU/L (ref 0–40)
Albumin: 4 g/dL (ref 3.8–4.9)
Alkaline Phosphatase: 92 IU/L (ref 39–117)
Bilirubin Total: 0.4 mg/dL (ref 0.0–1.2)
Bilirubin, Direct: 0.12 mg/dL (ref 0.00–0.40)
Total Protein: 6.8 g/dL (ref 6.0–8.5)

## 2020-02-23 LAB — HEMOGLOBIN A1C
Est. average glucose Bld gHb Est-mCnc: 197 mg/dL
Hgb A1c MFr Bld: 8.5 % — ABNORMAL HIGH (ref 4.8–5.6)

## 2020-03-17 ENCOUNTER — Encounter: Payer: Self-pay | Admitting: Family Medicine

## 2020-03-17 ENCOUNTER — Ambulatory Visit
Admission: RE | Admit: 2020-03-17 | Discharge: 2020-03-17 | Disposition: A | Discharge status: Home / Self Care | Payer: Managed Care, Other (non HMO) | Source: Ambulatory Visit | Attending: Family Medicine | Admitting: Family Medicine

## 2020-03-17 ENCOUNTER — Ambulatory Visit: Payer: Managed Care, Other (non HMO) | Admitting: Podiatry

## 2020-03-17 ENCOUNTER — Other Ambulatory Visit: Payer: Self-pay

## 2020-03-17 ENCOUNTER — Encounter: Payer: Self-pay | Admitting: Podiatry

## 2020-03-17 ENCOUNTER — Ambulatory Visit: Payer: Managed Care, Other (non HMO) | Admitting: Family Medicine

## 2020-03-17 VITALS — Temp 97.0°F

## 2020-03-17 VITALS — BP 150/80 | HR 90 | Temp 96.1°F | Ht 73.0 in | Wt 310.8 lb

## 2020-03-17 DIAGNOSIS — M79674 Pain in right toe(s): Secondary | ICD-10-CM | POA: Diagnosis not present

## 2020-03-17 DIAGNOSIS — B351 Tinea unguium: Secondary | ICD-10-CM | POA: Diagnosis not present

## 2020-03-17 DIAGNOSIS — I878 Other specified disorders of veins: Secondary | ICD-10-CM

## 2020-03-17 DIAGNOSIS — M7989 Other specified soft tissue disorders: Secondary | ICD-10-CM | POA: Diagnosis not present

## 2020-03-17 DIAGNOSIS — M79675 Pain in left toe(s): Secondary | ICD-10-CM

## 2020-03-17 DIAGNOSIS — E119 Type 2 diabetes mellitus without complications: Secondary | ICD-10-CM

## 2020-03-17 MED ORDER — CEPHALEXIN 500 MG PO CAPS: 500.0000 mg | ORAL_CAPSULE | Freq: Four times a day (QID) | ORAL | 0 refills | Status: DC

## 2020-03-17 NOTE — Assessment & Plan Note (Signed)
Significant enlargement of this compared to the right.  We will get an ultrasound to rule out DVT.  He potentially also has cellulitis.  We will treat with Keflex.  He will return in 2 days for recheck.

## 2020-03-17 NOTE — Patient Instructions (Signed)
Nice to see you. We are going to treat you with an antibiotic for possible cellulitis. I will have him return in 2 to 3 days to follow-up with me for this.

## 2020-03-17 NOTE — Progress Notes (Signed)
Tommi Rumps, MD Phone: [phone]  [name] is a 59 y.o. male who presents today for same-day visit.  Left leg swelling: Patient notes the swelling has progressively worsened over the last several months.  Notes he went to his podiatrist today and they noted the foot was warm and red and the patient was worried about cellulitis.  He notes no DVT history.  He has had cellulitis in the other foot.  He does have venous insufficiency.  No chest pain or shortness of breath.  Typically his blood pressure is running much better than this.  He had a rushing today.  Social History   Tobacco Use  Smoking Status Never Smoker  Smokeless Tobacco Never Used     ROS see history of present illness  Objective  Physical Exam Vitals:   03/17/20 1522  BP: (!) 150/80  Pulse: 90  Temp: (!) 96.1 F (35.6 C)  SpO2: 96%    BP Readings from Last 3 Encounters:  03/17/20 (!) 150/80  11/06/19 (!) 144/82  07/29/19 134/82   Wt Readings from Last 3 Encounters:  03/17/20 (!) 310 lb 12.8 oz (141 kg)  11/06/19 (!) 309 lb (140.2 kg)  07/24/19 (!) 317 lb 3.2 oz (143.9 kg)    Physical Exam Constitutional:      General: He is not in acute distress.    Appearance: He is not diaphoretic.  Cardiovascular:     Rate and Rhythm: Normal rate and regular rhythm.     Heart sounds: Normal heart sounds.  Pulmonary:     Effort: Pulmonary effort is normal.     Breath sounds: Normal breath sounds.  Musculoskeletal:     Comments: Significant enlargement of the left lower extremity compared to the right, there are venous stasis dermatitis changes in bilateral lower extremities, there is erythema in the tibial aspect of his foot and ankle that is warm to touch  Skin:    General: Skin is warm and dry.  Neurological:     Mental Status: He is alert.      Assessment/Plan: Please see individual problem list.  Left leg swelling Significant enlargement of this compared to the right.  We will get an  ultrasound to rule out DVT.  He potentially also has cellulitis.  We will treat with Keflex.  He will return in 2 days for recheck.   Orders Placed This Encounter  Procedures  . US Venous Img Lower Unilateral Left    Standing Status:   Future    Standing Expiration Date:   05/17/2021    Order Specific Question:   Reason for Exam (SYMPTOM  OR DIAGNOSIS REQUIRED)    Answer:   left lower extremity swelling, rule out DVT    Order Specific Question:   Preferred imaging location?    Answer:   [facility]    Order Specific Question:   Call Results- Best Contact Number?    Answer:   [phone] hold only if necessary     Meds ordered this encounter  Medications  . cephALEXin (KEFLEX) 500 MG capsule    Sig: Take 1 capsule (500 mg total) by mouth 4 (four) times daily.    Dispense:  28 capsule    Refill:  0    This visit occurred during the SARS-CoV-2 public health emergency.  Safety protocols were in place, including screening questions prior to the visit, additional usage of staff PPE, and extensive cleaning of exam room while observing appropriate contact time as indicated for disinfecting  solutions.    Tommi Rumps, MD [place]

## 2020-03-17 NOTE — Progress Notes (Signed)
This patient returns to my office for at risk foot care.  This patient requires this care by a professional since this patient will be at risk due to having diabetes with circulatory disorder.   This patient is unable to cut nails himself since the patient cannot reach his nails.These nails are painful walking and wearing shoes.  This patient presents for at risk foot care today.  General Appearance  Alert, conversant and in no acute stress.  Vascular  Dorsalis pedis and posterior tibial  pulses are weakly/absent  palpable  bilaterally.  Capillary return is within normal limits  bilaterally. Temperature is within normal limits  Bilaterally. Red swollen hot right foot/ankle.  Venous stasis  B/L.  Neurologic  Senn-Weinstein monofilament wire test within normal limits  bilaterally. Muscle power within normal limits bilaterally.  Nails Thick disfigured discolored nails with subungual debris  from hallux to fifth toes bilaterally. No evidence of bacterial infection or drainage bilaterally.  Orthopedic  No limitations of motion  feet .  No crepitus or effusions noted.  No bony pathology or digital deformities noted.  Skin  normotropic skin with no porokeratosis noted bilaterally.  No signs of infections or ulcers noted.     Onychomycosis  Pain in right toes  Pain in left toes  Consent was obtained for treatment procedures.   Mechanical debridement of nails 1-5  bilaterally performed with a nail nipper.  Filed with dremel without incident.  Told patient to see his venous doctor since he may be developing a cellulitis due to his venous stasis.  Patient says he will make an appointment today.   Return office visit   3 months                   Told patient to return for periodic foot care and evaluation due to potential at risk complications.   Chemere Steffler DPM  

## 2020-03-18 ENCOUNTER — Ambulatory Visit: Payer: Managed Care, Other (non HMO) | Admitting: Nurse Practitioner

## 2020-03-18 ENCOUNTER — Encounter: Payer: Self-pay | Admitting: *Deleted

## 2020-03-19 ENCOUNTER — Encounter: Payer: Self-pay | Admitting: Family Medicine

## 2020-03-19 ENCOUNTER — Ambulatory Visit: Payer: Managed Care, Other (non HMO) | Admitting: Family Medicine

## 2020-03-19 ENCOUNTER — Other Ambulatory Visit: Payer: Self-pay

## 2020-03-19 DIAGNOSIS — I10 Essential (primary) hypertension: Secondary | ICD-10-CM

## 2020-03-19 DIAGNOSIS — M7989 Other specified soft tissue disorders: Secondary | ICD-10-CM

## 2020-03-19 NOTE — Assessment & Plan Note (Signed)
Patient will be scheduled with his PCP to follow-up on his blood pressure.  He will continue his current medications at this time.

## 2020-03-19 NOTE — Progress Notes (Signed)
  Tommi Rumps, MD Phone: [phone]  [name] is a 59 y.o. male who presents today for f/u.  Cellulitis: Patient notes swelling and erythema in his left leg and foot been improving since going on the antibiotic.  Ultrasound was negative for DVT.  He notes no fevers.  HTN: not checking BPs at home.   Social History   Tobacco Use  Smoking Status Never Smoker  Smokeless Tobacco Never Used     ROS see history of present illness  Objective  Physical Exam Vitals:   03/19/20 1536 03/19/20 1549  BP: (!) 150/80 125/80  Pulse: 96   Temp: (!) 97.3 F (36.3 C)   SpO2: 97%     BP Readings from Last 3 Encounters:  03/19/20 125/80  03/17/20 (!) 150/80  11/06/19 (!) 144/82   Wt Readings from Last 3 Encounters:  03/19/20 (!) 307 lb 9.6 oz (139.5 kg)  03/17/20 (!) 310 lb 12.8 oz (141 kg)  11/06/19 (!) 309 lb (140.2 kg)    Physical Exam Left lower extremity with less swelling than 2 days ago, erythema appears to have improved over the tibial aspect of his left foot, there is less warmth, there is no tenderness, he has bilateral venous insufficiency changes in his legs  Assessment/Plan: Please see individual problem list.  Left leg swelling Likely related to cellulitis.  He will complete his course of Keflex.  He is improving at this time.  Discussed if he has any worsening symptoms or increasing swelling he needs to contact us right away.  Essential hypertension Patient will be scheduled with his PCP to follow-up on his blood pressure.  He will continue his current medications at this time.   No orders of the defined types were placed in this encounter.   No orders of the defined types were placed in this encounter.   This visit occurred during the SARS-CoV-2 public health emergency.  Safety protocols were in place, including screening questions prior to the visit, additional usage of staff PPE, and extensive cleaning of exam room while observing appropriate  contact time as indicated for disinfecting solutions.    Tommi Rumps, MD [place]

## 2020-03-19 NOTE — Assessment & Plan Note (Signed)
Likely related to cellulitis.  He will complete his course of Keflex.  He is improving at this time.  Discussed if he has any worsening symptoms or increasing swelling he needs to contact us right away.

## 2020-04-10 ENCOUNTER — Other Ambulatory Visit: Payer: Self-pay

## 2020-04-10 ENCOUNTER — Ambulatory Visit: Payer: Managed Care, Other (non HMO) | Admitting: Pharmacist

## 2020-04-10 ENCOUNTER — Ambulatory Visit: Payer: Managed Care, Other (non HMO) | Admitting: Internal Medicine

## 2020-04-10 DIAGNOSIS — E7800 Pure hypercholesterolemia, unspecified: Secondary | ICD-10-CM | POA: Diagnosis not present

## 2020-04-10 DIAGNOSIS — I890 Lymphedema, not elsewhere classified: Secondary | ICD-10-CM | POA: Diagnosis not present

## 2020-04-10 DIAGNOSIS — I10 Essential (primary) hypertension: Secondary | ICD-10-CM

## 2020-04-10 DIAGNOSIS — R600 Localized edema: Secondary | ICD-10-CM

## 2020-04-10 DIAGNOSIS — D649 Anemia, unspecified: Secondary | ICD-10-CM

## 2020-04-10 DIAGNOSIS — E1165 Type 2 diabetes mellitus with hyperglycemia: Secondary | ICD-10-CM

## 2020-04-10 DIAGNOSIS — Z6841 Body Mass Index (BMI) 40.0 and over, adult: Secondary | ICD-10-CM

## 2020-04-10 NOTE — Chronic Care Management (AMB) (Signed)
Chronic Care Management   Follow Up Note   04/10/2020 Name: [name] MRN: [id number] DOB: 14-Apr-1961  Referred by: [name], MD Reason for referral : Chronic Care Management (Medication Management)   [name] is a 59 y.o. year old male who is a primary care patient of [name], MD. The CCM team was consulted for assistance with chronic disease management and care coordination needs.    Met with patient face to face for CGM download  Review of patient status, including review of consultants reports, relevant laboratory and other test results, and collaboration with appropriate care team members and the patient's provider was performed as part of comprehensive patient evaluation and provision of chronic care management services.    SDOH (Social Determinants of Health) assessments performed: No See Care Plan activities for detailed interventions related to [facility])     Outpatient Encounter Medications as of 04/10/2020  Medication Sig  . cephALEXin (KEFLEX) 500 MG capsule Take 1 capsule (500 mg total) by mouth 4 (four) times daily.  . cholecalciferol (VITAMIN D) 1000 units tablet Take 1,000 Units by mouth daily.  . Continuous Blood Gluc Sensor (FREESTYLE LIBRE 14 DAY SENSOR) MISC Inject 1 Device into the skin every 14 (fourteen) days.  . hydrochlorothiazide (HYDRODIURIL) 25 MG tablet TAKE 1 TABLET(25 MG) BY MOUTH DAILY  . lisinopril (ZESTRIL) 40 MG tablet TAKE 1 TABLET BY MOUTH  DAILY  . loratadine (CLARITIN) 10 MG tablet Take 10 mg by mouth daily as needed for allergies.  . metFORMIN (GLUCOPHAGE) 500 MG tablet TAKE 2 TABLETS BY MOUTH TWICE DAILY  . mupirocin ointment (BACTROBAN) 2 % Apply to affected area on leg bid  . ranitidine (ZANTAC) 150 MG tablet Take 150 mg by mouth at bedtime as needed for heartburn.  . rosuvastatin (CRESTOR) 10 MG tablet TAKE 1 TABLET BY MOUTH  DAILY   No facility-administered encounter medications on file as of 04/10/2020.      Objective:   Goals Addressed            This Visit's Progress     Patient Stated   . PharmD "I want to work on my sugars" (pt-stated)       [place] (see longtitudinal plan of care for additional care plan information)  Current Barriers:  . Diabetes: uncontrolled; complicated by chronic medical conditions including HTN, HLD, most recent A1c 8.5%; o Endorses continued stress at work preventing his activity levels o Loves using CGM to review impact of foods, drinks on blood sugars  . Current antihyperglycemic regimen: metformin 1000 mg BID . Current blood glucose readings: Date of Download: 5/7-5/20/21 % Time CGM is active: 86 Average Glucose: 176 mg/dL Glucose Variability: 19.4% (goal <36%) Time in Goal:  - Time in range 70-180: 57% - Time above range: 43% - Time below range: 0% . Cardiovascular risk reduction: o Current hypertensive regimen: HCTZ 25 mg daily, lisinopril 40 mg daily o Current hyperlipidemia regimen: rosuvastatin 10 mg daily; last LDL at goal <100, could consider more stringent goal of LDL <70  Pharmacist Clinical Goal(s):  Marland Kitchen Over the next 90 days, patient will work with PharmD and primary care provider to address optimized medication management  Interventions: . Comprehensive medication review performed, medication list updated in electronic medical record . Inter-disciplinary care team collaboration (see longitudinal plan of care) . Reviewed GLP1. Demonstrated injection technique for Ozempic. Patient notes he will contemplate and discuss with his wife. Encouraged to call when he is ready to start additional  medication . Encouraged continue use of CGM to review blood sugars . Could consider increasing rosuvastatin to 20 mg daily  Patient Self Care Activities:  . Patient will check blood glucose using CGM at least QID, and provide at future appointment . Patient will take medications as prescribed . Patient will report any questions or concerns  to provider   Please see past updates related to this goal by clicking on the "Past Updates" button in the selected goal          Plan:  - Scheduled f/u call in ~ 6 weeks  [name], PharmD, [place], [place] Pharmacist [place] [phone]

## 2020-04-10 NOTE — Patient Instructions (Addendum)
We talked today about an injectable medication today, Ozempic, that helps with blood sugars and weight loss. This would be a weekly injection that you would give yourself.   This medication works in your stomach to help reduce your appetite, and in your pancreas to help your body better release its own insulin.   Call me if you decide you want [place] to send this medication in. There is a copay card available on the Ozempic website that will lower the copay to $25/month.   [name], PharmD [phone]  Visit Information  Goals Addressed            This Visit's Progress     Patient Stated   . PharmD "I want to work on my sugars" (pt-stated)       [place] (see longtitudinal plan of care for additional care plan information)  Current Barriers:  . Diabetes: uncontrolled; complicated by chronic medical conditions including HTN, HLD, most recent A1c 8.5%; o Endorses continued stress at work preventing his activity levels o Loves using CGM to review impact of foods, drinks on blood sugars  . Current antihyperglycemic regimen: metformin 1000 mg BID . Current blood glucose readings: Date of Download: 5/7-5/20/21 % Time CGM is active: 86 Average Glucose: 176 mg/dL Glucose Variability: 19.4% (goal <36%) Time in Goal:  - Time in range 70-180: 57% - Time above range: 43% - Time below range: 0% . Cardiovascular risk reduction: o Current hypertensive regimen: HCTZ 25 mg daily, lisinopril 40 mg daily o Current hyperlipidemia regimen: rosuvastatin 10 mg daily; last LDL at goal <100, could consider more stringent goal of LDL <70  Pharmacist Clinical Goal(s):  Marland Kitchen Over the next 90 days, patient will work with PharmD and primary care provider to address optimized medication management  Interventions: . Comprehensive medication review performed, medication list updated in electronic medical record . Inter-disciplinary care team collaboration (see longitudinal plan of care) . Reviewed  GLP1. Demonstrated injection technique for Ozempic. Patient notes he will contemplate and discuss with his wife. Encouraged to call when he is ready to start additional medication . Encouraged continue use of CGM to review blood sugars . Could consider increasing rosuvastatin to 20 mg daily  Patient Self Care Activities:  . Patient will check blood glucose using CGM at least QID, and provide at future appointment . Patient will take medications as prescribed . Patient will report any questions or concerns to provider   Please see past updates related to this goal by clicking on the "Past Updates" button in the selected goal         Patient verbalizes understanding of instructions provided today.    Plan:  - Scheduled f/u call in ~ 6 weeks  [name], PharmD, [place], [place] Pharmacist [place] [phone]

## 2020-04-10 NOTE — Progress Notes (Signed)
[name], male   DOB: [birth year], 59 y.o.   MRN: [id number]   Subjective:    [name], male    DOB: 1961-03-02, 59 y.o.   MRN: [id number]  HPI This visit occurred during the SARS-CoV-2 public health emergency.  Safety protocols were in place, including screening questions prior to the visit, additional usage of staff PPE, and extensive cleaning of exam room while observing appropriate contact time as indicated for disinfecting solutions.  Patient here for scheduled follow up.  Was recently evaluated by Dr Caryl Bis for lower extremity swelling.  Diagnosed with cellulitis.  Treated with keflex.  Legs improved.  No evidence of acute cellulitis now.  Ultrasound - no DVT.  Discussed leg elevation.  Discussed returning to [facility] for evaluation.  Discussed pump.  He declines any further intervention at this time.  No chest pain or sob reported.  Catie present for part of the visit. Discussed treatment options.  Discussed trulicity/.ozempic.  He was instructed on pen and administering.  He wants to think about starting.  Will let us know.  Discussed diet and exercise.  Blood pressure improved.  Discussed diet and exercise.    Past Medical History:  Diagnosis Date  . 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 Depression   . Environmental allergies   . Family history of colon cancer   . GERD (gastroesophageal reflux disease)   . History of chicken pox   . Hyperlipidemia   . Hypertension   . Venous stasis   . Vitamin D deficiency    Past Surgical History:  Procedure Laterality Date  . CHOLECYSTECTOMY  2002  . COLONOSCOPY WITH PROPOFOL N/A 06/27/2015   Procedure: COLONOSCOPY WITH PROPOFOL;  Surgeon: Lollie Sails, MD;  Location: [facility] ENDOSCOPY;  Service: Endoscopy;  Laterality: N/A;  . HERNIA REPAIR  1962   Family History  Problem Relation Age of Onset  . Stomach cancer Mother    . Arthritis Mother   . Diabetes Mother   . Hypertension Mother   . Hyperlipidemia Father   . Heart disease Father   . Diabetes Father   . Hypertension Father   . Arthritis Maternal Grandmother   . Hyperlipidemia Maternal Grandmother   . Hypertension Maternal Grandmother   . Arthritis Maternal Grandfather   . Hyperlipidemia Maternal Grandfather   . Heart disease Maternal Grandfather   . Hypertension Maternal Grandfather   . Arthritis Paternal Grandmother   . Breast cancer Paternal Grandmother   . Hypertension Paternal Grandmother   . Arthritis Paternal Grandfather   . Hyperlipidemia Paternal Grandfather   . Heart disease Paternal Grandfather   . Diabetes Paternal Grandfather   . Hypertension Paternal Grandfather   . Diabetes Sister    Social History   Socioeconomic History  . Marital status: Widowed    Spouse name: Not on file  . Number of children: Not on file  . Years of education: Not on file  . Highest education level: Not on file  Occupational History  . Not on file  Tobacco Use  . Smoking status: Never Smoker  . Smokeless tobacco: Never Used  Substance and Sexual Activity  . Alcohol use: Yes    Alcohol/week: 0.0 - 1.0 standard drinks    Comment: 1 drink every 2 months  . Drug use: No  . Sexual activity: Not on file  Other Topics Concern  . Not on  file  Social History Narrative  . Not on file   Social Determinants of Health   Financial Resource Strain:   . Difficulty of Paying Living Expenses:   Food Insecurity:   . Worried About Charity fundraiser in the Last Year:   . Arboriculturist in the Last Year:   Transportation Needs:   . Film/video editor (Medical):   Marland Kitchen Lack of Transportation (Non-Medical):   Physical Activity:   . Days of Exercise per Week:   . Minutes of Exercise per Session:   Stress:   . Feeling of Stress :   Social Connections:   . Frequency of Communication with Friends and Family:   . Frequency of Social Gatherings with  Friends and Family:   . Attends Religious Services:   . Active Member of Clubs or Organizations:   . Attends Archivist Meetings:   Marland Kitchen Marital Status:     Outpatient Encounter Medications as of 04/10/2020  Medication Sig  . cephALEXin (KEFLEX) 500 MG capsule Take 1 capsule (500 mg total) by mouth 4 (four) times daily.  . cholecalciferol (VITAMIN D) 1000 units tablet Take 1,000 Units by mouth daily.  . Continuous Blood Gluc Sensor (FREESTYLE LIBRE 14 DAY SENSOR) MISC Inject 1 Device into the skin every 14 (fourteen) days.  . hydrochlorothiazide (HYDRODIURIL) 25 MG tablet TAKE 1 TABLET(25 MG) BY MOUTH DAILY  . lisinopril (ZESTRIL) 40 MG tablet TAKE 1 TABLET BY MOUTH  DAILY  . loratadine (CLARITIN) 10 MG tablet Take 10 mg by mouth daily as needed for allergies.  . metFORMIN (GLUCOPHAGE) 500 MG tablet TAKE 2 TABLETS BY MOUTH TWICE DAILY  . mupirocin ointment (BACTROBAN) 2 % Apply to affected area on leg bid  . ranitidine (ZANTAC) 150 MG tablet Take 150 mg by mouth at bedtime as needed for heartburn.  . rosuvastatin (CRESTOR) 10 MG tablet TAKE 1 TABLET BY MOUTH  DAILY   No facility-administered encounter medications on file as of 04/10/2020.    Review of Systems  Constitutional: Negative for appetite change and unexpected weight change.  HENT: Negative for congestion and sinus pressure.   Respiratory: Negative for cough, chest tightness and shortness of breath.   Cardiovascular: Negative for chest pain and palpitations.       Leg swelling improved.    Gastrointestinal: Negative for abdominal pain, diarrhea, nausea and vomiting.  Genitourinary: Negative for difficulty urinating and dysuria.  Musculoskeletal: Negative for joint swelling and myalgias.  Skin: Negative for color change and rash.       Some minimal oozing left leg.  No open wound.  No increased erythema.  Neurological: Negative for dizziness, light-headedness and headaches.  Psychiatric/Behavioral: Negative for  agitation and dysphoric mood.       Objective:    Physical Exam Constitutional:      General: He is not in acute distress.    Appearance: Normal appearance. He is well-developed.  HENT:     Head: Normocephalic and atraumatic.     Right Ear: External ear normal.     Left Ear: External ear normal.  Eyes:     General: No scleral icterus.       Right eye: No discharge.        Left eye: No discharge.     Conjunctiva/sclera: Conjunctivae normal.  Cardiovascular:     Rate and Rhythm: Normal rate and regular rhythm.  Pulmonary:     Effort: Pulmonary effort is normal. No respiratory distress.  Breath sounds: Normal breath sounds.  Abdominal:     General: Bowel sounds are normal.     Palpations: Abdomen is soft.     Tenderness: There is no abdominal tenderness.  Musculoskeletal:        General: No tenderness.     Cervical back: Neck supple. No tenderness.     Comments: Decreased swelling .    Lymphadenopathy:     Cervical: No cervical adenopathy.  Skin:    Findings: No erythema or rash.     Comments: Stasis changes present.   Neurological:     Mental Status: He is alert.  Psychiatric:        Mood and Affect: Mood normal.        Behavior: Behavior normal.     BP 128/78   Pulse 85   Temp 97.7 F (36.5 C)   Resp 16   Ht 6' 1"  (1.854 m)   Wt (!) 308 lb 3.2 oz (139.8 kg)   SpO2 99%   BMI 40.66 kg/m  Wt Readings from Last 3 Encounters:  04/10/20 (!) 308 lb 3.2 oz (139.8 kg)  03/19/20 (!) 307 lb 9.6 oz (139.5 kg)  03/17/20 (!) 310 lb 12.8 oz (141 kg)     Lab Results  Component Value Date   WBC 6.9 07/31/2019   HGB 14.2 07/31/2019   HCT 43.3 07/31/2019   PLT 234 07/31/2019   GLUCOSE 191 (H) 02/22/2020   CHOL 127 02/22/2020   TRIG 102 02/22/2020   HDL 37 (L) 02/22/2020   LDLCALC 71 02/22/2020   ALT 15 02/22/2020   AST 16 02/22/2020   NA 141 02/22/2020   K 3.5 02/22/2020   CL 98 02/22/2020   CREATININE 0.94 02/22/2020   BUN 16 02/22/2020   CO2 28  02/22/2020   TSH 1.660 07/31/2019   HGBA1C 8.5 (H) 02/22/2020    US Venous Img Lower Unilateral Left  Result Date: 03/17/2020 CLINICAL DATA:  Left lower extremity swelling. EXAM: Left LOWER EXTREMITY VENOUS DOPPLER ULTRASOUND TECHNIQUE: Gray-scale sonography with compression, as well as color and duplex ultrasound, were performed to evaluate the deep venous system(s) from the level of the common femoral vein through the popliteal and proximal calf veins. COMPARISON:  None. FINDINGS: VENOUS Normal compressibility of the common femoral, superficial femoral, and popliteal veins, as well as the visualized calf veins. Visualized portions of profunda femoral vein and great saphenous vein unremarkable. No filling defects to suggest DVT on grayscale or color Doppler imaging. Doppler waveforms show normal direction of venous flow, normal respiratory plasticity and response to augmentation. Limited views of the contralateral common femoral vein are unremarkable. OTHER None. Limitations: none IMPRESSION: Negative. Electronically Signed   By: Marijo Conception M.D.   On: 03/17/2020 16:50       Assessment & Plan:   Problem List Items Addressed This Visit    Anemia    Follow cbc.       BMI 40.0-44.9, adult (HCC)    Discussed diet and exercise.  Follow.       Essential hypertension    Blood pressure on my recheck as outlined.  Improved.  Continue lisinopril and hctz.  Follow pressures.  Follow metabolic panel.       Hypercholesterolemia    On crestor.  Low cholesterol diet and exercise.  Follow lipid panel and liver function tests.        Lower extremity edema    Improved.  No evidence of cellulitis today.  Off abx.  Follow.        Lymphedema    Has seen [facility] previously.  Discussed compression hose, leg elevation and pumps.  Declines further evaluation with [facility] at this time.  Follow.        Type 2 diabetes mellitus with hyperglycemia (HCC)    Low carb diet and exercise.  Discussed treatment  options with him.  Catie present for part of visit.  Discussed trulicity and ozempic.  Instructed on how to use the pen.  He wants to think about further treatment.  Will call back.  Follow met b and a1c. Has libre sensor now.  Sugars reviewed.  Elevated.            [name], MD

## 2020-04-11 NOTE — Progress Notes (Signed)
Reviewed information.  Agree with plan.    Dr Gino Garrabrant 

## 2020-04-13 ENCOUNTER — Encounter: Payer: Self-pay | Admitting: Internal Medicine

## 2020-04-13 NOTE — Assessment & Plan Note (Signed)
Discussed diet and exercise.  Follow.  

## 2020-04-13 NOTE — Assessment & Plan Note (Signed)
Has seen [facility] previously.  Discussed compression hose, leg elevation and pumps.  Declines further evaluation with [facility] at this time.  Follow.

## 2020-04-13 NOTE — Assessment & Plan Note (Signed)
Follow cbc.  

## 2020-04-13 NOTE — Assessment & Plan Note (Signed)
On crestor.  Low cholesterol diet and exercise.  Follow lipid panel and liver function tests.   

## 2020-04-13 NOTE — Assessment & Plan Note (Signed)
Improved.  No evidence of cellulitis today.  Off abx.  Follow.

## 2020-04-13 NOTE — Assessment & Plan Note (Signed)
Blood pressure on my recheck as outlined.  Improved.  Continue lisinopril and hctz.  Follow pressures.  Follow metabolic panel.

## 2020-04-13 NOTE — Assessment & Plan Note (Addendum)
Low carb diet and exercise.  Discussed treatment options with him.  Catie present for part of visit.  Discussed trulicity and ozempic.  Instructed on how to use the pen.  He wants to think about further treatment.  Will call back.  Follow met b and a1c. Has libre sensor now.  Sugars reviewed.  Elevated.

## 2020-05-29 ENCOUNTER — Telehealth: Payer: Self-pay | Admitting: Internal Medicine

## 2020-05-29 ENCOUNTER — Ambulatory Visit: Payer: Managed Care, Other (non HMO) | Admitting: Pharmacist

## 2020-05-29 ENCOUNTER — Encounter: Payer: Self-pay | Admitting: Pharmacist

## 2020-05-29 DIAGNOSIS — E1351 Other specified diabetes mellitus with diabetic peripheral angiopathy without gangrene: Secondary | ICD-10-CM

## 2020-05-29 DIAGNOSIS — E1165 Type 2 diabetes mellitus with hyperglycemia: Secondary | ICD-10-CM

## 2020-05-29 DIAGNOSIS — I10 Essential (primary) hypertension: Secondary | ICD-10-CM

## 2020-05-29 DIAGNOSIS — Z6841 Body Mass Index (BMI) 40.0 and over, adult: Secondary | ICD-10-CM

## 2020-05-29 DIAGNOSIS — E7800 Pure hypercholesterolemia, unspecified: Secondary | ICD-10-CM

## 2020-05-29 NOTE — Patient Instructions (Addendum)
[name] (and [name]),   It was great talking with you today! Remember to scan your sugar at least every 6 hours to make sure all of your readings are uploaded to [facility].  In reviewing your [facility], your readings have remained fairly consistently elevated since we last spoke in May. Remember, our goal for your A1c to get to <7% is for fasting readings to be <130 and 2 hour after meal readings <180.   I STRONGLY recommend you consider adding a medication for diabetes that can also help with weight loss. Either a daily pill (Rybelsus) or a weekly injectable (Ozempic) are great options that I am confident will allow [place] to get your A1c to goal. These medications work in your stomach and slow how quickly it empties into your intestines. This makes you feel full a bit quicker and stay full a bit longer, so it helps reduce your appetite and help you better manage portion sizes. This medication actually just got approved specifically for weight loss, even in patients who do not have diabetes. It's quite effective, and I know would help you towards your goal of controlling your diabetes.   Please give me a call if you want to talk more about this prior to our appointment in August!  Take Care,  [name], PharmD [phone]  Visit Information  Goals Addressed              This Visit's Progress     Patient Stated   .  PharmD "I want to work on my sugars" (pt-stated)        [place] (see longtitudinal plan of care for additional care plan information)  Current Barriers:  . Diabetes: uncontrolled; complicated by chronic medical conditions including HTN, HLD, most recent A1c 8.5%; o Reports that his new hire at work has been great, and is relieving a lot of work-related stress. He and his partner plan to take more time off over the next few months for vacation. . Current antihyperglycemic regimen: metformin 1000 mg BID . Current blood glucose readings: Date of Download:  6/25-78 % Time CGM is active: 42% Average Glucose: 182 mg/dL Glucose Variability: 14.4% (goal <36%) Time in Goal:  - Time in range 70-180: 48% - Time above range: 52% - Time below range: 0% . Cardiovascular risk reduction: o Current hypertensive regimen: HCTZ 25 mg daily, lisinopril 40 mg daily o Current hyperlipidemia regimen: rosuvastatin 10 mg daily; last LDL at goal <100, could consider more stringent goal of LDL <70 . Supplementation: has stopped taking OTC vitamin D because he ran out, plans to restart that   Pharmacist Clinical Goal(s):  Marland Kitchen Over the next 90 days, patient will work with PharmD and primary care provider to address optimized medication management  Interventions: . Comprehensive medication review performed, medication list updated in electronic medical record . Inter-disciplinary care team collaboration (see longitudinal plan of care) . Reviewed goal A1c, goal fasting, and goal 2 hour post prandial glucose readings. Encouraged patient to scan CGM at least QID to ensure full capture of readings.  . Recommended addition of GLP1, oral Rybelsus or weekly SQ Ozempic. Discussed benefits of weight loss. Reviewed microvascular and macrovascular complications of diabetes. Patient elects to continue to focus on lifestyle modifications, as he notes he will be able to start exercising soon. Reviewed that it is imperative to his long term health to improve sugars. Will mail more information about GLP1 for he and [name] to review. Encouraged him to let me  know if he would like to start GLP1 . Overdue for f/u with PCP. Will collaborate w/ clinical staff to schedule.   Patient Self Care Activities:  . Patient will check blood glucose using CGM at least QID, and provide at future appointment . Patient will take medications as prescribed . Patient will report any questions or concerns to provider   Please see past updates related to this goal by clicking on the "Past Updates" button in  the selected goal         The patient verbalized understanding of instructions provided today and agreed to receive a mailed copy of patient instruction and/or educational materials.  Plan:  - Scheduled f/u call in ~ 6 weeks  [name], PharmD, [place], [place] Pharmacist [place] [phone]

## 2020-05-29 NOTE — Telephone Encounter (Signed)
Pt needs fasting labs prior to appt on 06/26/20. Please call pt to schedule. He would like to have them done at a [facility]

## 2020-05-29 NOTE — Chronic Care Management (AMB) (Signed)
Chronic Care Management   Follow Up Note   05/29/2020 Name: [name] MRN: [id number] DOB: November 30, 1960  Referred by: [name], MD Reason for referral : Chronic Care Management (Medication Management)   [name] is a 59 y.o. year old male who is a primary care patient of [name], MD. The CCM team was consulted for assistance with chronic disease management and care coordination needs.    Contacted patient for medication management review.  Review of patient status, including review of consultants reports, relevant laboratory and other test results, and collaboration with appropriate care team members and the patient's provider was performed as part of comprehensive patient evaluation and provision of chronic care management services.    SDOH (Social Determinants of Health) assessments performed: Yes See Care Plan activities for detailed interventions related to SDOH)  SDOH Interventions     Most Recent Value  SDOH Interventions  Physical Activity Interventions Other (Comments)  [discussed importance of exercise]       Outpatient Encounter Medications as of 05/29/2020  Medication Sig  . cholecalciferol (VITAMIN D) 1000 units tablet Take 1,000 Units by mouth daily.  . Continuous Blood Gluc Sensor (FREESTYLE LIBRE 14 DAY SENSOR) MISC Inject 1 Device into the skin every 14 (fourteen) days.  . hydrochlorothiazide (HYDRODIURIL) 25 MG tablet TAKE 1 TABLET(25 MG) BY MOUTH DAILY  . lisinopril (ZESTRIL) 40 MG tablet TAKE 1 TABLET BY MOUTH  DAILY  . metFORMIN (GLUCOPHAGE) 500 MG tablet TAKE 2 TABLETS BY MOUTH TWICE DAILY  . rosuvastatin (CRESTOR) 10 MG tablet TAKE 1 TABLET BY MOUTH  DAILY  . loratadine (CLARITIN) 10 MG tablet Take 10 mg by mouth daily as needed for allergies. (Patient not taking: Reported on 05/29/2020)  . mupirocin ointment (BACTROBAN) 2 % Apply to affected area on leg bid (Patient not taking: Reported on 05/29/2020)  . [DISCONTINUED] cephALEXin  (KEFLEX) 500 MG capsule Take 1 capsule (500 mg total) by mouth 4 (four) times daily.  . [DISCONTINUED] ranitidine (ZANTAC) 150 MG tablet Take 150 mg by mouth at bedtime as needed for heartburn.   No facility-administered encounter medications on file as of 05/29/2020.     Objective:   Goals Addressed              This Visit's Progress     Patient Stated   .  PharmD "I want to work on my sugars" (pt-stated)        [place] (see longtitudinal plan of care for additional care plan information)  Current Barriers:  . Diabetes: uncontrolled; complicated by chronic medical conditions including HTN, HLD, most recent A1c 8.5%; o Reports that his new hire at work has been great, and is relieving a lot of work-related stress. He and his partner plan to take more time off over the next few months for vacation. . Current antihyperglycemic regimen: metformin 1000 mg BID . Current blood glucose readings: Date of Download: 6/25-78 % Time CGM is active: 42% Average Glucose: 182 mg/dL Glucose Variability: 14.4% (goal <36%) Time in Goal:  - Time in range 70-180: 48% - Time above range: 52% - Time below range: 0% . Cardiovascular risk reduction: o Current hypertensive regimen: HCTZ 25 mg daily, lisinopril 40 mg daily o Current hyperlipidemia regimen: rosuvastatin 10 mg daily; last LDL at goal <100, could consider more stringent goal of LDL <70 . Supplementation: has stopped taking OTC vitamin D because he ran out, plans to restart that   Pharmacist Clinical Goal(s):  Marland Kitchen Over  the next 90 days, patient will work with PharmD and primary care provider to address optimized medication management  Interventions: . Comprehensive medication review performed, medication list updated in electronic medical record . Inter-disciplinary care team collaboration (see longitudinal plan of care) . Reviewed goal A1c, goal fasting, and goal 2 hour post prandial glucose readings. Encouraged patient to scan  CGM at least QID to ensure full capture of readings.  . Recommended addition of GLP1, oral Rybelsus or weekly SQ Ozempic. Discussed benefits of weight loss. Reviewed microvascular and macrovascular complications of diabetes. Patient elects to continue to focus on lifestyle modifications, as he notes he will be able to start exercising soon. Reviewed that it is imperative to his long term health to improve sugars. Will mail more information about GLP1 for he and [name] to review. Encouraged him to let me know if he would like to start GLP1 . Overdue for f/u with PCP. Will collaborate w/ clinical staff to schedule.   Patient Self Care Activities:  . Patient will check blood glucose using CGM at least QID, and provide at future appointment . Patient will take medications as prescribed . Patient will report any questions or concerns to provider   Please see past updates related to this goal by clicking on the "Past Updates" button in the selected goal          Plan:  - Scheduled f/u call in ~ 6 weeks  [name], PharmD, [place], [place] Pharmacist [place] [phone]

## 2020-05-30 NOTE — Progress Notes (Signed)
I have reviewed the above note and agree. I was available to the pharmacist for consultation.  Afifa Truax, MD 

## 2020-06-02 NOTE — Telephone Encounter (Signed)
Labs ordered for [employer]. My chart msg sent to pt.

## 2020-06-19 ENCOUNTER — Other Ambulatory Visit: Payer: Self-pay

## 2020-06-19 ENCOUNTER — Encounter: Payer: Self-pay | Admitting: Podiatry

## 2020-06-19 ENCOUNTER — Ambulatory Visit: Payer: Managed Care, Other (non HMO) | Admitting: Podiatry

## 2020-06-19 DIAGNOSIS — E119 Type 2 diabetes mellitus without complications: Secondary | ICD-10-CM

## 2020-06-19 DIAGNOSIS — B351 Tinea unguium: Secondary | ICD-10-CM | POA: Diagnosis not present

## 2020-06-19 DIAGNOSIS — M79674 Pain in right toe(s): Secondary | ICD-10-CM

## 2020-06-19 DIAGNOSIS — M79675 Pain in left toe(s): Secondary | ICD-10-CM

## 2020-06-19 DIAGNOSIS — I878 Other specified disorders of veins: Secondary | ICD-10-CM | POA: Diagnosis not present

## 2020-06-19 NOTE — Progress Notes (Signed)
This patient returns to my office for at risk foot care.  This patient requires this care by a professional since this patient will be at risk due to having diabetes with circulatory disorder.   This patient is unable to cut nails himself since the patient cannot reach his nails.These nails are painful walking and wearing shoes.  This patient presents for at risk foot care today.  General Appearance  Alert, conversant and in no acute stress.  Vascular  Dorsalis pedis and posterior tibial  pulses are weakly/absent  palpable  bilaterally.  Capillary return is within normal limits  bilaterally. Temperature is within normal limits  Bilaterally. Red swollen hot right foot/ankle.  Venous stasis  B/L.  Neurologic  Senn-Weinstein monofilament wire test within normal limits  bilaterally. Muscle power within normal limits bilaterally.  Nails Thick disfigured discolored nails with subungual debris  from hallux to fifth toes bilaterally. No evidence of bacterial infection or drainage bilaterally.  Orthopedic  No limitations of motion  feet .  No crepitus or effusions noted.  No bony pathology or digital deformities noted.  Skin  normotropic skin with no porokeratosis noted bilaterally.  No signs of infections or ulcers noted.     Onychomycosis  Pain in right toes  Pain in left toes  Consent was obtained for treatment procedures.   Mechanical debridement of nails 1-5  bilaterally performed with a nail nipper.  Filed with dremel without incident.  Told patient to see his venous doctor since he may be developing a cellulitis due to his venous stasis.  Patient says he will make an appointment today.   Return office visit   3 months                   Told patient to return for periodic foot care and evaluation due to potential at risk complications.   Gardiner Barefoot DPM

## 2020-06-26 ENCOUNTER — Ambulatory Visit: Payer: Managed Care, Other (non HMO) | Admitting: Internal Medicine

## 2020-06-26 ENCOUNTER — Ambulatory Visit (INDEPENDENT_AMBULATORY_CARE_PROVIDER_SITE_OTHER): Payer: Managed Care, Other (non HMO)

## 2020-06-26 ENCOUNTER — Other Ambulatory Visit: Payer: Self-pay

## 2020-06-26 DIAGNOSIS — M79671 Pain in right foot: Secondary | ICD-10-CM

## 2020-06-26 DIAGNOSIS — L989 Disorder of the skin and subcutaneous tissue, unspecified: Secondary | ICD-10-CM

## 2020-06-26 DIAGNOSIS — E1351 Other specified diabetes mellitus with diabetic peripheral angiopathy without gangrene: Secondary | ICD-10-CM

## 2020-06-26 DIAGNOSIS — E1165 Type 2 diabetes mellitus with hyperglycemia: Secondary | ICD-10-CM | POA: Diagnosis not present

## 2020-06-26 DIAGNOSIS — E559 Vitamin D deficiency, unspecified: Secondary | ICD-10-CM

## 2020-06-26 DIAGNOSIS — E7800 Pure hypercholesterolemia, unspecified: Secondary | ICD-10-CM

## 2020-06-26 DIAGNOSIS — I890 Lymphedema, not elsewhere classified: Secondary | ICD-10-CM

## 2020-06-26 DIAGNOSIS — D649 Anemia, unspecified: Secondary | ICD-10-CM

## 2020-06-26 DIAGNOSIS — Z86010 Personal history of colonic polyps: Secondary | ICD-10-CM

## 2020-06-26 DIAGNOSIS — I10 Essential (primary) hypertension: Secondary | ICD-10-CM

## 2020-06-26 MED ORDER — MUPIROCIN 2 % EX OINT: TOPICAL_OINTMENT | CUTANEOUS | 0 refills | Status: DC

## 2020-06-26 NOTE — Progress Notes (Signed)
[name], male   DOB: August 04, 1961, 59 y.o.   MRN: [id number]   Subjective:    [name], male    DOB: 1961/01/12, 59 y.o.   MRN: [id number]  HPI This visit occurred during the SARS-CoV-2 public health emergency.  Safety protocols were in place, including screening questions prior to the visit, additional usage of staff PPE, and extensive cleaning of exam room while observing appropriate contact time as indicated for disinfecting solutions.  Patient here for a scheduled follow up.  He reports he is doing relatively well.  Has adjusted his diet.  He has lost weight.  Stress seems to be better.  New partner.  Planning to take more time off.  Reviewed ambulatory glucose profile.  Discussed with pt.  Sugars still higher than goal with majority of readings above 180.  Discussed my desire to start GLP1.  Discussed benefits and possible weight loss as a side effect.  He declines.  Wants to continue to work on diet and exercise.  He has cut down his soft drinks.  Drinking more water.  No chest pain.  Breathing stable.  No urine or bowel change reported.  He did hit his right 5th toe on a chest last night.  Some increased pain and toe nail injury.  No pain to walk.  Open lesion on his leg.  Discussed compression hose and leg elevation.    Past Medical History:  Diagnosis Date  . 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 Depression   . Environmental allergies   . Family history of colon cancer   . GERD (gastroesophageal reflux disease)   . History of chicken pox   . Hyperlipidemia   . Hypertension   . Venous stasis   . Vitamin D deficiency    Past Surgical History:  Procedure Laterality Date  . CHOLECYSTECTOMY  2002  . COLONOSCOPY WITH PROPOFOL N/A 06/27/2015   Procedure: COLONOSCOPY WITH PROPOFOL;  Surgeon: Lollie Sails, MD;  Location: [facility] ENDOSCOPY;  Service: Endoscopy;   Laterality: N/A;  . HERNIA REPAIR  1962   Family History  Problem Relation Age of Onset  . Stomach cancer Mother   . Arthritis Mother   . Diabetes Mother   . Hypertension Mother   . Hyperlipidemia Father   . Heart disease Father   . Diabetes Father   . Hypertension Father   . Arthritis Maternal Grandmother   . Hyperlipidemia Maternal Grandmother   . Hypertension Maternal Grandmother   . Arthritis Maternal Grandfather   . Hyperlipidemia Maternal Grandfather   . Heart disease Maternal Grandfather   . Hypertension Maternal Grandfather   . Arthritis Paternal Grandmother   . Breast cancer Paternal Grandmother   . Hypertension Paternal Grandmother   . Arthritis Paternal Grandfather   . Hyperlipidemia Paternal Grandfather   . Heart disease Paternal Grandfather   . Diabetes Paternal Grandfather   . Hypertension Paternal Grandfather   . Diabetes Sister    Social History   Socioeconomic History  . Marital status: Widowed    Spouse name: Not on file  . Number of children: Not on file  . Years of education: Not on file  . Highest education level: Not on file  Occupational History  . Not on file  Tobacco Use  . Smoking status: Never Smoker  . Smokeless tobacco: Never Used  Substance and Sexual Activity  . Alcohol  use: Yes    Alcohol/week: 0.0 - 1.0 standard drinks    Comment: 1 drink every 2 months  . Drug use: No  . Sexual activity: Not on file  Other Topics Concern  . Not on file  Social History Narrative  . Not on file   Social Determinants of Health   Financial Resource Strain:   . Difficulty of Paying Living Expenses:   Food Insecurity:   . Worried About Charity fundraiser in the Last Year:   . Arboriculturist in the Last Year:   Transportation Needs:   . Film/video editor (Medical):   Marland Kitchen Lack of Transportation (Non-Medical):   Physical Activity: Inactive  . Days of Exercise per Week: 0 days  . Minutes of Exercise per Session: 0 min  Stress:   .  Feeling of Stress :   Social Connections:   . Frequency of Communication with Friends and Family:   . Frequency of Social Gatherings with Friends and Family:   . Attends Religious Services:   . Active Member of Clubs or Organizations:   . Attends Archivist Meetings:   Marland Kitchen Marital Status:     Outpatient Encounter Medications as of 06/26/2020  Medication Sig  . cholecalciferol (VITAMIN D) 1000 units tablet Take 1,000 Units by mouth daily.  . Continuous Blood Gluc Sensor (FREESTYLE LIBRE 14 DAY SENSOR) MISC Inject 1 Device into the skin every 14 (fourteen) days.  . hydrochlorothiazide (HYDRODIURIL) 25 MG tablet TAKE 1 TABLET(25 MG) BY MOUTH DAILY  . lisinopril (ZESTRIL) 40 MG tablet TAKE 1 TABLET BY MOUTH  DAILY  . loratadine (CLARITIN) 10 MG tablet Take 10 mg by mouth daily as needed for allergies.   . metFORMIN (GLUCOPHAGE) 500 MG tablet TAKE 2 TABLETS BY MOUTH TWICE DAILY  . mupirocin ointment (BACTROBAN) 2 % Apply to affected area on leg bid  . rosuvastatin (CRESTOR) 10 MG tablet TAKE 1 TABLET BY MOUTH  DAILY  . [DISCONTINUED] mupirocin ointment (BACTROBAN) 2 % Apply to affected area on leg bid   No facility-administered encounter medications on file as of 06/26/2020.    Review of Systems  Constitutional: Negative for appetite change and unexpected weight change.       Has adjusted diet.  Has lost weight.   HENT: Negative for congestion and sinus pressure.   Respiratory: Negative for cough, chest tightness and shortness of breath.   Cardiovascular: Negative for chest pain and palpitations.       Leg swelling - decreased from previous, but still with pedal and lower extremity edema.   Gastrointestinal: Negative for abdominal pain, diarrhea, nausea and vomiting.  Genitourinary: Negative for difficulty urinating and dysuria.  Musculoskeletal: Negative for joint swelling and myalgias.  Skin:       Stasis changes lower extremity.  Open lesion - posterior leg.   Neurological:  Negative for dizziness, light-headedness and headaches.  Psychiatric/Behavioral: Negative for agitation and dysphoric mood.       Objective:    Physical Exam Vitals reviewed.  Constitutional:      General: He is not in acute distress.    Appearance: Normal appearance. He is well-developed.  HENT:     Head: Normocephalic and atraumatic.     Right Ear: External ear normal.     Left Ear: External ear normal.  Eyes:     General: No scleral icterus.       Right eye: No discharge.        Left eye:  No discharge.     Conjunctiva/sclera: Conjunctivae normal.  Cardiovascular:     Rate and Rhythm: Normal rate and regular rhythm.  Pulmonary:     Effort: Pulmonary effort is normal. No respiratory distress.     Breath sounds: Normal breath sounds.  Abdominal:     General: Bowel sounds are normal.     Palpations: Abdomen is soft.     Tenderness: There is no abdominal tenderness.  Musculoskeletal:     Cervical back: Neck supple. No tenderness.     Comments: Pedal and lower extremity edema.  Stasis changes. Open lesion - posterior calf.   Lymphadenopathy:     Cervical: No cervical adenopathy.  Skin:    Findings: No erythema.  Neurological:     Mental Status: He is alert.  Psychiatric:        Mood and Affect: Mood normal.        Behavior: Behavior normal.     BP 128/72   Pulse 91   Temp 98.2 F (36.8 C) (Oral)   Resp 16   Ht 6' 1" (1.854 m)   Wt (!) 301 lb (136.5 kg)   SpO2 98%   BMI 39.71 kg/m  Wt Readings from Last 3 Encounters:  06/26/20 (!) 301 lb (136.5 kg)  04/10/20 (!) 308 lb 3.2 oz (139.8 kg)  03/19/20 (!) 307 lb 9.6 oz (139.5 kg)     Lab Results  Component Value Date   WBC 6.9 07/31/2019   HGB 14.2 07/31/2019   HCT 43.3 07/31/2019   PLT 234 07/31/2019   GLUCOSE 191 (H) 02/22/2020   CHOL 127 02/22/2020   TRIG 102 02/22/2020   HDL 37 (L) 02/22/2020   LDLCALC 71 02/22/2020   ALT 15 02/22/2020   AST 16 02/22/2020   NA 141 02/22/2020   K 3.5 02/22/2020    CL 98 02/22/2020   CREATININE 0.94 02/22/2020   BUN 16 02/22/2020   CO2 28 02/22/2020   TSH 1.660 07/31/2019   HGBA1C 8.5 (H) 02/22/2020    US Venous Img Lower Unilateral Left  Result Date: 03/17/2020 CLINICAL DATA:  Left lower extremity swelling. EXAM: Left LOWER EXTREMITY VENOUS DOPPLER ULTRASOUND TECHNIQUE: Gray-scale sonography with compression, as well as color and duplex ultrasound, were performed to evaluate the deep venous system(s) from the level of the common femoral vein through the popliteal and proximal calf veins. COMPARISON:  None. FINDINGS: VENOUS Normal compressibility of the common femoral, superficial femoral, and popliteal veins, as well as the visualized calf veins. Visualized portions of profunda femoral vein and great saphenous vein unremarkable. No filling defects to suggest DVT on grayscale or color Doppler imaging. Doppler waveforms show normal direction of venous flow, normal respiratory plasticity and response to augmentation. Limited views of the contralateral common femoral vein are unremarkable. OTHER None. Limitations: none IMPRESSION: Negative. Electronically Signed   By: Marijo Conception M.D.   On: 03/17/2020 16:50       Assessment & Plan:   Problem List Items Addressed This Visit    Anemia    Follow cbc.       Essential hypertension    Blood pressure as outlined.  Continue lisinopril and hctz.  Follow pressures.  Follow metabolic panel.       History of colonic polyps    Colonoscopy 06/2015.  Recommended f/u in 5 years.  Will need to arrange.  Get leg straightened out first.        Hypercholesterolemia    On crestor.  Low  cholesterol diet and exercise.  Follow lipid panel and liver function tests.        Lymphedema    Has seen [facility] previously.  Discussed compression hose, leg elevation , etc.  Follow.       Right foot pain    Foot/ toe pain - 5th metatarsal and 5th toe.  Injury last night as outlined.  Toe cleaned.  Check xray.  Consider podiatry  referral.  Follow for any infection.       Relevant Orders   DG Foot 2 Views Right (Completed)   Secondary diabetes with peripheral circulatory disorder (HCC)    Low carb diet and exercise as outlined.  Declined other medication.  Treat open wound as outlined.  Follow.        Skin lesion    Open wound on leg.  No surrounding erythema.  Stasis changes.  Persistent pedal and lower extremity swelling.  bactroban as directed.  Elevate legs.  Follow closely.        Type 2 diabetes mellitus with hyperglycemia ([place])    Reviewed ambulatory glucose profile.  Discussed sugars and persistent elevation.  Discussed low carb diet and exercise.  He has adjusted diet.  Lost some weight.  Decreased soft drinks.  Discussed my desire for additional medication (for example, GLP1, etc).  He declines.  Wants to continue with diet and exercise.  Follow met b and a1c.       Vitamin D deficiency    Follow vitamin D level.           [name], MD

## 2020-06-28 ENCOUNTER — Encounter: Payer: Self-pay | Admitting: Internal Medicine

## 2020-06-28 NOTE — Assessment & Plan Note (Signed)
Blood pressure as outlined.  Continue lisinopril and hctz.  Follow pressures.  Follow metabolic panel.

## 2020-06-28 NOTE — Assessment & Plan Note (Signed)
Open wound on leg.  No surrounding erythema.  Stasis changes.  Persistent pedal and lower extremity swelling.  bactroban as directed.  Elevate legs.  Follow closely.

## 2020-06-28 NOTE — Assessment & Plan Note (Signed)
On crestor.  Low cholesterol diet and exercise.  Follow lipid panel and liver function tests.   

## 2020-06-28 NOTE — Assessment & Plan Note (Signed)
Colonoscopy 06/2015.  Recommended f/u in 5 years.  Will need to arrange.  Get leg straightened out first.

## 2020-06-28 NOTE — Assessment & Plan Note (Signed)
Follow vitamin D level.  

## 2020-06-28 NOTE — Assessment & Plan Note (Signed)
Has seen [facility] previously.  Discussed compression hose, leg elevation , etc.  Follow.

## 2020-06-28 NOTE — Assessment & Plan Note (Signed)
Low carb diet and exercise as outlined.  Declined other medication.  Treat open wound as outlined.  Follow.

## 2020-06-28 NOTE — Assessment & Plan Note (Signed)
Follow cbc.  

## 2020-06-28 NOTE — Assessment & Plan Note (Signed)
Reviewed ambulatory glucose profile.  Discussed sugars and persistent elevation.  Discussed low carb diet and exercise.  He has adjusted diet.  Lost some weight.  Decreased soft drinks.  Discussed my desire for additional medication (for example, GLP1, etc).  He declines.  Wants to continue with diet and exercise.  Follow met b and a1c.  

## 2020-06-28 NOTE — Assessment & Plan Note (Signed)
Foot/ toe pain - 5th metatarsal and 5th toe.  Injury last night as outlined.  Toe cleaned.  Check xray.  Consider podiatry referral.  Follow for any infection.

## 2020-07-08 ENCOUNTER — Encounter: Payer: Self-pay | Admitting: Internal Medicine

## 2020-07-10 LAB — BASIC METABOLIC PANEL
BUN/Creatinine Ratio: 22 — ABNORMAL HIGH (ref 9–20)
BUN: 18 mg/dL (ref 6–24)
CO2: 28 mmol/L (ref 20–29)
Calcium: 9.3 mg/dL (ref 8.7–10.2)
Chloride: 99 mmol/L (ref 96–106)
Creatinine, Ser: 0.83 mg/dL (ref 0.76–1.27)
GFR calc Af Amer: 111 mL/min/{1.73_m2} (ref >59)
GFR calc non Af Amer: 96 mL/min/{1.73_m2} (ref >59)
Glucose: 201 mg/dL — ABNORMAL HIGH (ref 65–99)
Potassium: 3.4 mmol/L — ABNORMAL LOW (ref 3.5–5.2)
Sodium: 141 mmol/L (ref 134–144)

## 2020-07-10 LAB — CBC WITH DIFFERENTIAL/PLATELET
Basophils Absolute: 0.1 10*3/uL (ref 0.0–0.2)
Basos: 1 %
EOS (ABSOLUTE): 0.2 10*3/uL (ref 0.0–0.4)
Eos: 3 %
Hematocrit: 41.6 % (ref 37.5–51.0)
Hemoglobin: 13.9 g/dL (ref 13.0–17.7)
Immature Grans (Abs): 0 10*3/uL (ref 0.0–0.1)
Immature Granulocytes: 0 %
Lymphocytes Absolute: 1.6 10*3/uL (ref 0.7–3.1)
Lymphs: 22 %
MCH: 29 pg (ref 26.6–33.0)
MCHC: 33.4 g/dL (ref 31.5–35.7)
MCV: 87 fL (ref 79–97)
Monocytes Absolute: 0.6 10*3/uL (ref 0.1–0.9)
Monocytes: 9 %
Neutrophils Absolute: 4.7 10*3/uL (ref 1.4–7.0)
Neutrophils: 65 %
Platelets: 268 10*3/uL (ref 150–450)
RBC: 4.8 x10E6/uL (ref 4.14–5.80)
RDW: 13 % (ref 11.6–15.4)
WBC: 7.2 10*3/uL (ref 3.4–10.8)

## 2020-07-10 LAB — HEPATIC FUNCTION PANEL
ALT: 13 IU/L (ref 0–44)
AST: 11 IU/L (ref 0–40)
Albumin: 3.9 g/dL (ref 3.8–4.9)
Alkaline Phosphatase: 85 IU/L (ref 48–121)
Bilirubin Total: 0.3 mg/dL (ref 0.0–1.2)
Bilirubin, Direct: 0.1 mg/dL (ref 0.00–0.40)
Total Protein: 6.8 g/dL (ref 6.0–8.5)

## 2020-07-10 LAB — LIPID PANEL
Chol/HDL Ratio: 3.5 ratio (ref 0.0–5.0)
Cholesterol, Total: 138 mg/dL (ref 100–199)
HDL: 40 mg/dL (ref >39)
LDL Chol Calc (NIH): 81 mg/dL (ref 0–99)
Triglycerides: 88 mg/dL (ref 0–149)
VLDL Cholesterol Cal: 17 mg/dL (ref 5–40)

## 2020-07-10 LAB — TSH: TSH: 1.56 u[IU]/mL (ref 0.450–4.500)

## 2020-07-10 LAB — HEMOGLOBIN A1C
Est. average glucose Bld gHb Est-mCnc: 186 mg/dL
Hgb A1c MFr Bld: 8.1 % — ABNORMAL HIGH (ref 4.8–5.6)

## 2020-07-11 ENCOUNTER — Telehealth: Payer: Self-pay | Admitting: Internal Medicine

## 2020-07-11 DIAGNOSIS — E876 Hypokalemia: Secondary | ICD-10-CM

## 2020-07-11 DIAGNOSIS — Z125 Encounter for screening for malignant neoplasm of prostate: Secondary | ICD-10-CM

## 2020-07-11 NOTE — Telephone Encounter (Signed)
Orders placed for f/u labs at [facility]

## 2020-07-17 ENCOUNTER — Other Ambulatory Visit: Payer: Self-pay | Admitting: Internal Medicine

## 2020-07-22 ENCOUNTER — Ambulatory Visit: Payer: Managed Care, Other (non HMO) | Admitting: Pharmacist

## 2020-07-22 ENCOUNTER — Encounter: Payer: Self-pay | Admitting: Pharmacist

## 2020-07-22 DIAGNOSIS — E1165 Type 2 diabetes mellitus with hyperglycemia: Secondary | ICD-10-CM

## 2020-07-22 DIAGNOSIS — Z6839 Body mass index (BMI) 39.0-39.9, adult: Secondary | ICD-10-CM

## 2020-07-22 MED ORDER — OZEMPIC (0.25 OR 0.5 MG/DOSE) 2 MG/1.5ML ~~LOC~~ SOPN: 0.5000 mg | PEN_INJECTOR | SUBCUTANEOUS | 2 refills | Status: DC

## 2020-07-22 NOTE — Patient Instructions (Signed)
Visit Information  Goals Addressed              This Visit's Progress     Patient Stated   .  PharmD "I want to work on my sugars" (pt-stated)        [place] (see longtitudinal plan of care for additional care plan information)  Current Barriers:  . Social, financial, and community barriers: o Reports just getting back from vacation. Had a more regular schedule, more activity, and sugars were still uncontrolled, which was disappointment to him . Diabetes: uncontrolled; complicated by chronic medical conditions including HTN, HLD, most recent A1c 8.1% . Current antihyperglycemic regimen: metformin 1000 mg BID - notes he occasionally forgets AM metformin dose . Current blood glucose readings: Date of Download: 8/18-8/31/21 % Time CGM is active: 48% Average Glucose: 184 mg/dL Time in Goal:  - Time in range 70-180: 48% - Time above range: 52% - Time below range: 0% Observed patterns: Elevated throughout the day . Cardiovascular risk reduction: o Current hypertensive regimen: HCTZ 25 mg daily, lisinopril 40 mg daily o Current hyperlipidemia regimen: rosuvastatin 10 mg daily; last LDL at goal <100, could consider more stringent goal of LDL <70 . Supplementation: OTC vitamin D  Pharmacist Clinical Goal(s):  Marland Kitchen Over the next 90 days, patient will work with PharmD and primary care provider to address optimized medication management  Interventions: . Comprehensive medication review performed, medication list updated in electronic medical record . Inter-disciplinary care team collaboration (see longitudinal plan of care) . Reviewed goal A1c, goal fasting, and goal 2 hour post prandial glucose readings. Encouraged patient to scan CGM at least QID to ensure full capture of readings.  . Discussed weekly Ozempic. Patient amenable to this. Start Ozempic 0.25 mg once weekly for 4 weeks, then increase to 0.5 mg weekly. Continue metformin 1000 mg BID. Reviewed adherence  strategies.  Patient Self Care Activities:  . Patient will check blood glucose using CGM at least QID, and provide at future appointment . Patient will take medications as prescribed . Patient will report any questions or concerns to provider   Please see past updates related to this goal by clicking on the "Past Updates" button in the selected goal         The patient verbalized understanding of instructions provided today and declined a print copy of patient instruction materials.   Plan:  - Scheduled f/u call in ~ 6 weeks  [name], PharmD, [place], [place] Pharmacist [place] [phone]

## 2020-07-22 NOTE — Chronic Care Management (AMB) (Signed)
Chronic Care Management   Follow Up Note   07/22/2020 Name: [name] MRN: [id number] DOB: 29-Jul-1961  Referred by: [name], MD Reason for referral : Chronic Care Management (Medication Management)   [name] is a 59 y.o. year old male who is a primary care patient of [name], MD. The CCM team was consulted for assistance with chronic disease management and care coordination needs.    Contacted patient for medication management review. Spoke with him and partner, [name].  Review of patient status, including review of consultants reports, relevant laboratory and other test results, and collaboration with appropriate care team members and the patient's provider was performed as part of comprehensive patient evaluation and provision of chronic care management services.    SDOH (Social Determinants of Health) assessments performed: Yes See Care Plan activities for detailed interventions related to SDOH)  SDOH Interventions     Most Recent Value  SDOH Interventions  Financial Strain Interventions Intervention Not Indicated       Outpatient Encounter Medications as of 07/22/2020  Medication Sig   cholecalciferol (VITAMIN D) 1000 units tablet Take 1,000 Units by mouth daily.   Continuous Blood Gluc Sensor (FREESTYLE LIBRE 14 DAY SENSOR) MISC Inject 1 Device into the skin every 14 (fourteen) days.   hydrochlorothiazide (HYDRODIURIL) 25 MG tablet TAKE 1 TABLET(25 MG) BY MOUTH DAILY   lisinopril (ZESTRIL) 40 MG tablet TAKE 1 TABLET BY MOUTH  DAILY   loratadine (CLARITIN) 10 MG tablet Take 10 mg by mouth daily as needed for allergies.    metFORMIN (GLUCOPHAGE) 500 MG tablet TAKE 2 TABLETS BY MOUTH TWICE DAILY   rosuvastatin (CRESTOR) 10 MG tablet TAKE 1 TABLET BY MOUTH  DAILY   mupirocin ointment (BACTROBAN) 2 % Apply to affected area on leg bid   Semaglutide,0.25 or 0.5MG /DOS, (OZEMPIC, 0.25 OR 0.5 MG/DOSE,) 2 MG/1.5ML SOPN Inject 0.375 mLs (0.5 mg  total) into the skin once a week.   No facility-administered encounter medications on file as of 07/22/2020.     Objective:   Goals Addressed              This Visit's Progress     Patient Stated     PharmD "I want to work on my sugars" (pt-stated)        [name] (see longtitudinal plan of care for additional care plan information)  Current Barriers:   Social, financial, and community barriers: o Reports just getting back from vacation. Had a more regular schedule, more activity, and sugars were still uncontrolled, which was disappointment to him  Diabetes: uncontrolled; complicated by chronic medical conditions including HTN, HLD, most recent A1c 8.1%  Current antihyperglycemic regimen: metformin 1000 mg BID - notes he occasionally forgets AM metformin dose  Current blood glucose readings: Date of Download: 8/18-8/31/21 % Time CGM is active: 48% Average Glucose: 184 mg/dL Time in Goal:  - Time in range 70-180: 48% - Time above range: 52% - Time below range: 0% Observed patterns: Elevated throughout the day  Cardiovascular risk reduction: o Current hypertensive regimen: HCTZ 25 mg daily, lisinopril 40 mg daily o Current hyperlipidemia regimen: rosuvastatin 10 mg daily; last LDL at goal <100, could consider more stringent goal of LDL <70  Supplementation: OTC vitamin D  Pharmacist Clinical Goal(s):   Over the next 90 days, patient will work with PharmD and primary care provider to address optimized medication management  Interventions:  Comprehensive medication review performed, medication list updated in electronic medical record  Inter-disciplinary care team collaboration (see longitudinal plan of care)  Reviewed goal A1c, goal fasting, and goal 2 hour post prandial glucose readings. Encouraged patient to scan CGM at least QID to ensure full capture of readings.   Discussed weekly Ozempic. Patient amenable to this. Start Ozempic 0.25 mg once weekly  for 4 weeks, then increase to 0.5 mg weekly. Continue metformin 1000 mg BID. Reviewed adherence strategies.  Patient Self Care Activities:   Patient will check blood glucose using CGM at least QID, and provide at future appointment  Patient will take medications as prescribed  Patient will report any questions or concerns to provider   Please see past updates related to this goal by clicking on the "Past Updates" button in the selected goal          Plan:  - Scheduled f/u call in ~ 6 weeks  [name], PharmD, [place], [place] Pharmacist [place] [phone]

## 2020-07-22 NOTE — Telephone Encounter (Signed)
Labs given at [facility] appt today.

## 2020-07-23 ENCOUNTER — Telehealth (INDEPENDENT_AMBULATORY_CARE_PROVIDER_SITE_OTHER): Payer: Managed Care, Other (non HMO) | Admitting: Internal Medicine

## 2020-07-23 ENCOUNTER — Encounter: Payer: Self-pay | Admitting: Internal Medicine

## 2020-07-23 DIAGNOSIS — E1165 Type 2 diabetes mellitus with hyperglycemia: Secondary | ICD-10-CM

## 2020-07-23 DIAGNOSIS — Z800 Family history of malignant neoplasm of digestive organs: Secondary | ICD-10-CM

## 2020-07-23 DIAGNOSIS — I890 Lymphedema, not elsewhere classified: Secondary | ICD-10-CM | POA: Diagnosis not present

## 2020-07-23 DIAGNOSIS — E7800 Pure hypercholesterolemia, unspecified: Secondary | ICD-10-CM

## 2020-07-23 DIAGNOSIS — D649 Anemia, unspecified: Secondary | ICD-10-CM

## 2020-07-23 DIAGNOSIS — L989 Disorder of the skin and subcutaneous tissue, unspecified: Secondary | ICD-10-CM | POA: Diagnosis not present

## 2020-07-23 DIAGNOSIS — I10 Essential (primary) hypertension: Secondary | ICD-10-CM

## 2020-07-23 NOTE — Progress Notes (Signed)
[name], male   DOB: [birth year], 59 y.o.   MRN: [id number]   Virtual Visit via video Note  This visit type was conducted due to national recommendations for restrictions regarding the COVID-19 pandemic (e.g. social distancing).  This format is felt to be most appropriate for this patient at this time.  All issues noted in this document were discussed and addressed.  No physical exam was performed (except for noted visual exam findings with Video Visits).   I connected with [name] by a video enabled telemedicine application and verified that I am speaking with the correct person using two identifiers. Location patient: home Location provider: work Persons participating in the virtual visit: patient, provider  The limitations, risks, security and privacy concerns of performing an evaluation and management service by video and the availability of in person appointments have been discussed.  It has also been discussed with the patient that there may be a patient responsible charge related to this service. The patient expressed understanding and agreed to proceed.   Reason for visit: scheduled follow up.    HPI: He reports he is doing relatively well.  Feels better.  Has lost weight.  Trying to watch his diet.  Discussed exercise.  No chest pain or sob reported.  No abdominal pain or bowel change reported.  Previously discussed elevated a1c.  Discussed trial of ozempic.  He was hesitant at first.  Agreeable now.  Working with Catie.  Blood pressures doing well.  Legs improved.  Wound better. Swelling better.     ROS: See pertinent positives and negatives per HPI.  Past Medical History:  Diagnosis Date  . 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 Depression   . Environmental allergies   . Family history of colon cancer   . GERD (gastroesophageal reflux disease)   . History of chicken  pox   . Hyperlipidemia   . Hypertension   . Venous stasis   . Vitamin D deficiency     Past Surgical History:  Procedure Laterality Date  . CHOLECYSTECTOMY  2002  . COLONOSCOPY WITH PROPOFOL N/A 06/27/2015   Procedure: COLONOSCOPY WITH PROPOFOL;  Surgeon: Lollie Sails, MD;  Location: [facility] ENDOSCOPY;  Service: Endoscopy;  Laterality: N/A;  . HERNIA REPAIR  1962    Family History  Problem Relation Age of Onset  . Stomach cancer Mother   . Arthritis Mother   . Diabetes Mother   . Hypertension Mother   . Hyperlipidemia Father   . Heart disease Father   . Diabetes Father   . Hypertension Father   . Arthritis Maternal Grandmother   . Hyperlipidemia Maternal Grandmother   . Hypertension Maternal Grandmother   . Arthritis Maternal Grandfather   . Hyperlipidemia Maternal Grandfather   . Heart disease Maternal Grandfather   . Hypertension Maternal Grandfather   . Arthritis Paternal Grandmother   . Breast cancer Paternal Grandmother   . Hypertension Paternal Grandmother   . Arthritis Paternal Grandfather   . Hyperlipidemia Paternal Grandfather   . Heart disease Paternal Grandfather   . Diabetes Paternal Grandfather   . Hypertension Paternal Grandfather   . Diabetes Sister     SOCIAL HX: reviewed.    Current Outpatient Medications:  .  cholecalciferol (VITAMIN D) 1000 units tablet, Take 1,000 Units by mouth daily., Disp: , Rfl:  .  Continuous Blood Gluc Sensor (FREESTYLE LIBRE 14 DAY SENSOR)  MISC, Inject 1 Device into the skin every 14 (fourteen) days., Disp: 6 each, Rfl: 3 .  hydrochlorothiazide (HYDRODIURIL) 25 MG tablet, TAKE 1 TABLET(25 MG) BY MOUTH DAILY, Disp: 90 tablet, Rfl: 3 .  lisinopril (ZESTRIL) 40 MG tablet, TAKE 1 TABLET BY MOUTH  DAILY, Disp: 90 tablet, Rfl: 3 .  loratadine (CLARITIN) 10 MG tablet, Take 10 mg by mouth daily as needed for allergies. , Disp: , Rfl:  .  metFORMIN (GLUCOPHAGE) 500 MG tablet, TAKE 2 TABLETS BY MOUTH TWICE DAILY, Disp: 360 tablet,  Rfl: 1 .  mupirocin ointment (BACTROBAN) 2 %, Apply to affected area on leg bid, Disp: 22 g, Rfl: 0 .  rosuvastatin (CRESTOR) 10 MG tablet, TAKE 1 TABLET BY MOUTH  DAILY, Disp: 90 tablet, Rfl: 3 .  Semaglutide,0.25 or 0.5MG/DOS, (OZEMPIC, 0.25 OR 0.5 MG/DOSE,) 2 MG/1.5ML SOPN, Inject 0.375 mLs (0.5 mg total) into the skin once a week., Disp: 1.5 mL, Rfl: 2  EXAM:  GENERAL: alert, oriented, appears well and in no acute distress  HEENT: atraumatic, conjunttiva clear, no obvious abnormalities on inspection of external nose and ears  NECK: normal movements of the head and neck  LUNGS: on inspection no signs of respiratory distress, breathing rate appears normal, no obvious gross SOB, gasping or wheezing  CV: no obvious cyanosis  EXTREMITIES:  Decreased swelling.  Wound - improved.  No surrounding erythema.   PSYCH/NEURO: pleasant and cooperative, no obvious depression or anxiety, speech and thought processing grossly intact  ASSESSMENT AND PLAN:  Discussed the following assessment and plan:  Type 2 diabetes mellitus with hyperglycemia (HCC) Sugars remain elevated.  Now agreeable to ozempic.  Low carb diet and exercise.  Start ozempic.  On metformin.  Follow met b and a1c.   Skin lesion The previous open wound - healing.  No increased erythema.  Decreased lower extremity swelling.  Follow. Continue bactroban.   Lymphedema Has seen [facility] previously.  Compression hose.  Swelling improved.  Follow.   Hypercholesterolemia On crestor.  Low cholesterol diet and exercise.  Follow lipid panel and liver function tests.    Family history of colon cancer Colonoscopy 06/2015 - tubular adenoma x 2 and hyperplastic polyp removed and diverticulosis.  Recommended f/u in 5 years.  Due.    Essential hypertension Blood pressure doing better.  Continue lisinopril and hctz.  Follow pressures.  Follow metabolic panel.   Anemia Follow cbc.    Meds ordered this encounter  Medications  . mupirocin  ointment (BACTROBAN) 2 %    Sig: Apply to affected area on leg bid    Dispense:  22 g    Refill:  0     I discussed the assessment and treatment plan with the patient. The patient was provided an opportunity to ask questions and all were answered. The patient agreed with the plan and demonstrated an understanding of the instructions.   The patient was advised to call back or seek an in-person evaluation if the symptoms worsen or if the condition fails to improve as anticipated.   [name], MD

## 2020-07-23 NOTE — Progress Notes (Signed)
I have reviewed the above note and agree. I was available to the pharmacist for consultation.  Vylet Maffia, MD 

## 2020-07-27 ENCOUNTER — Other Ambulatory Visit: Payer: Self-pay | Admitting: Internal Medicine

## 2020-07-28 MED ORDER — MUPIROCIN 2 % EX OINT
TOPICAL_OINTMENT | CUTANEOUS | 0 refills | Status: DC
Start: 2020-07-28 — End: 2020-07-31

## 2020-07-28 NOTE — Assessment & Plan Note (Signed)
Blood pressure doing better.  Continue lisinopril and hctz.  Follow pressures.  Follow metabolic panel.

## 2020-07-28 NOTE — Assessment & Plan Note (Signed)
The previous open wound - healing.  No increased erythema.  Decreased lower extremity swelling.  Follow. Continue bactroban.

## 2020-07-28 NOTE — Assessment & Plan Note (Signed)
Sugars remain elevated.  Now agreeable to ozempic.  Low carb diet and exercise.  Start ozempic.  On metformin.  Follow met b and a1c.

## 2020-07-28 NOTE — Assessment & Plan Note (Signed)
Colonoscopy 06/2015 - tubular adenoma x 2 and hyperplastic polyp removed and diverticulosis.  Recommended f/u in 5 years.  Due.

## 2020-07-28 NOTE — Assessment & Plan Note (Signed)
Has seen [facility] previously.  Compression hose.  Swelling improved.  Follow.

## 2020-07-28 NOTE — Assessment & Plan Note (Signed)
On crestor.  Low cholesterol diet and exercise.  Follow lipid panel and liver function tests.   

## 2020-07-28 NOTE — Assessment & Plan Note (Signed)
Follow cbc.  

## 2020-07-30 ENCOUNTER — Other Ambulatory Visit: Payer: Self-pay | Admitting: Internal Medicine

## 2020-08-01 ENCOUNTER — Telehealth: Payer: Self-pay | Admitting: Internal Medicine

## 2020-08-01 NOTE — Telephone Encounter (Signed)
Form placed in forms folder. 

## 2020-08-01 NOTE — Telephone Encounter (Signed)
Patient has dropped of a [employer] 2021 Wellness screening appeal form for Dr. Nicki Reaper to fill out. Form is up front in color folder.

## 2020-08-02 NOTE — Telephone Encounter (Signed)
Form completed and placed in box.  Please notify pt.

## 2020-08-04 NOTE — Telephone Encounter (Signed)
Patient has been called and notified. Form left in the front for patient Pick Up.

## 2020-08-23 ENCOUNTER — Other Ambulatory Visit: Payer: Self-pay | Admitting: Internal Medicine

## 2020-09-02 ENCOUNTER — Ambulatory Visit: Payer: Managed Care, Other (non HMO) | Admitting: Pharmacist

## 2020-09-02 VITALS — Wt 295.0 lb

## 2020-09-02 DIAGNOSIS — E1165 Type 2 diabetes mellitus with hyperglycemia: Secondary | ICD-10-CM

## 2020-09-02 DIAGNOSIS — Z6839 Body mass index (BMI) 39.0-39.9, adult: Secondary | ICD-10-CM

## 2020-09-02 NOTE — Chronic Care Management (AMB) (Signed)
Chronic Care Management   Follow Up Note   09/02/2020 Name: [name] MRN: [id number] DOB: [birth year]  Referred by: Bradley Pheasant, MD Reason for referral : Chronic Care Management (Medication Management)   [name] is a 59 y.o. year old male who is a primary care patient of Bradley Pheasant, MD. The CCM team was consulted for assistance with chronic disease management and care coordination needs.    Contacted patient for medication management f/u  Review of patient status, including review of consultants reports, relevant laboratory and other test results, and collaboration with appropriate care team members and the patient's provider was performed as part of comprehensive patient evaluation and provision of chronic care management services.    SDOH (Social Determinants of Health) assessments performed: Yes See Care Plan activities for detailed interventions related to [facility])     Outpatient Encounter Medications as of 09/02/2020  Medication Sig  . cholecalciferol (VITAMIN D) 1000 units tablet Take 1,000 Units by mouth daily.  . Continuous Blood Gluc Sensor (FREESTYLE LIBRE 14 DAY SENSOR) MISC Inject 1 Device into the skin every 14 (fourteen) days.  . hydrochlorothiazide (HYDRODIURIL) 25 MG tablet TAKE 1 TABLET(25 MG) BY MOUTH DAILY  . lisinopril (ZESTRIL) 40 MG tablet TAKE 1 TABLET BY MOUTH  DAILY  . loratadine (CLARITIN) 10 MG tablet Take 10 mg by mouth daily as needed for allergies.   . metFORMIN (GLUCOPHAGE) 500 MG tablet TAKE 2 TABLETS BY MOUTH TWICE DAILY  . mupirocin ointment (BACTROBAN) 2 % APPLY TO THE AFFECTED AREA ON LEG TWICE DAILY  . rosuvastatin (CRESTOR) 10 MG tablet TAKE 1 TABLET BY MOUTH  DAILY  . Semaglutide,0.25 or 0.5MG /DOS, (OZEMPIC, 0.25 OR 0.5 MG/DOSE,) 2 MG/1.5ML SOPN Inject 0.375 mLs (0.5 mg total) into the skin once a week.   No facility-administered encounter medications on file as of 09/02/2020.     Objective:   Goals Addressed                This Visit's Progress     Patient Stated   .  PharmD "I want to work on my sugars" (pt-stated)        [name] (see longtitudinal plan of care for additional care plan information)  Current Barriers:  . Social, financial, and community barriers: o None noted at this time. o Patient has lost 2 lbs since last PCP visit last month . Diabetes: uncontrolled; complicated by chronic medical conditions including HTN, HLD, most recent A1c 8.1% . Current antihyperglycemic regimen: metformin 1000 mg BID, Ozempic 0.25 mg once weekly - due to increase to 0.5 mg weekly with next injection. Notes that he wishes he hadn't been "so shy" with starting Ozempic.  . Current blood glucose readings: Date of Download: 08/19/20 % Time CGM is active: 34% Average Glucose: 207 mg/dL Glucose Management Indicator: n/a (not enough data) Glucose Variability: 16.5% (goal <36%) Time in Goal:  - Time in range 70-180: 21% - Time above range: 79% - Time below range: 0% Observed patterns: Not enough data to determine trends . Cardiovascular risk reduction: o Current hypertensive regimen: HCTZ 25 mg daily, lisinopril 40 mg daily o Current hyperlipidemia regimen: rosuvastatin 10 mg daily; last LDL at goal <100, could consider more stringent goal of LDL <70 . Supplementation: OTC vitamin D  Pharmacist Clinical Goal(s):  Marland Kitchen Over the next 90 days, patient will work with PharmD and primary care provider to address optimized medication management  Interventions: . Comprehensive medication review performed, medication list updated  in electronic medical record . Inter-disciplinary care team collaboration (see longitudinal plan of care) . Provided encouragement for agreeing to start Ozempic therapy. Increase to 0.5 mg as scheduled with next injection. Encouraged to continue to moderate carbohydrate consumption and monitor appropriate portion sizes, which should also improve with increased Ozempic dose. Continue  metformin 1000 mg BID.   Patient Self Care Activities:  . Patient will check blood glucose using CGM at least QID, and provide at future appointment . Patient will take medications as prescribed . Patient will report any questions or concerns to provider   Please see past updates related to this goal by clicking on the "Past Updates" button in the selected goal          Plan:  - Scheduled f/u call in ~12 weeks (~6 weeks to next PCP visit, then delaying until after the holidays d/t patient travel plans)  [name], PharmD, [place], [place] Pharmacist [place] [phone]

## 2020-09-02 NOTE — Patient Instructions (Signed)
Visit Information  Goals Addressed              This Visit's Progress     Patient Stated   .  PharmD "I want to work on my sugars" (pt-stated)        [place] (see longtitudinal plan of care for additional care plan information)  Current Barriers:  . Social, financial, and community barriers: o None noted at this time. o Patient has lost 2 lbs since last PCP visit last month . Diabetes: uncontrolled; complicated by chronic medical conditions including HTN, HLD, most recent A1c 8.1% . Current antihyperglycemic regimen: metformin 1000 mg BID, Ozempic 0.25 mg once weekly - due to increase to 0.5 mg weekly with next injection. Notes that he wishes he hadn't been "so shy" with starting Ozempic.  . Current blood glucose readings: Date of Download: 08/19/20 % Time CGM is active: 34% Average Glucose: 207 mg/dL Glucose Management Indicator: n/a (not enough data) Glucose Variability: 16.5% (goal <36%) Time in Goal:  - Time in range 70-180: 21% - Time above range: 79% - Time below range: 0% Observed patterns: Not enough data to determine trends . Cardiovascular risk reduction: o Current hypertensive regimen: HCTZ 25 mg daily, lisinopril 40 mg daily o Current hyperlipidemia regimen: rosuvastatin 10 mg daily; last LDL at goal <100, could consider more stringent goal of LDL <70 . Supplementation: OTC vitamin D  Pharmacist Clinical Goal(s):  Marland Kitchen Over the next 90 days, patient will work with PharmD and primary care provider to address optimized medication management  Interventions: . Comprehensive medication review performed, medication list updated in electronic medical record . Inter-disciplinary care team collaboration (see longitudinal plan of care) . Provided encouragement for agreeing to start Ozempic therapy. Increase to 0.5 mg as scheduled with next injection. Encouraged to continue to moderate carbohydrate consumption and monitor appropriate portion sizes, which should also  improve with increased Ozempic dose. Continue metformin 1000 mg BID.   Patient Self Care Activities:  . Patient will check blood glucose using CGM at least QID, and provide at future appointment . Patient will take medications as prescribed . Patient will report any questions or concerns to provider   Please see past updates related to this goal by clicking on the "Past Updates" button in the selected goal         The patient verbalized understanding of instructions provided today and declined a print copy of patient instruction materials.   Plan:  - Scheduled f/u call in ~12 weeks (~6 weeks to next PCP visit, then delaying until after the holidays d/t patient travel plans)  [name], PharmD, [place], CPP Clinical Pharmacist [place] [phone]

## 2020-09-20 ENCOUNTER — Other Ambulatory Visit: Payer: Self-pay | Admitting: Internal Medicine

## 2020-09-20 DIAGNOSIS — E1165 Type 2 diabetes mellitus with hyperglycemia: Secondary | ICD-10-CM

## 2020-09-22 ENCOUNTER — Encounter: Payer: Self-pay | Admitting: Podiatry

## 2020-09-22 ENCOUNTER — Ambulatory Visit: Payer: Managed Care, Other (non HMO) | Admitting: Podiatry

## 2020-09-22 ENCOUNTER — Other Ambulatory Visit: Payer: Self-pay

## 2020-09-22 DIAGNOSIS — B351 Tinea unguium: Secondary | ICD-10-CM

## 2020-09-22 DIAGNOSIS — M79675 Pain in left toe(s): Secondary | ICD-10-CM | POA: Diagnosis not present

## 2020-09-22 DIAGNOSIS — E119 Type 2 diabetes mellitus without complications: Secondary | ICD-10-CM | POA: Diagnosis not present

## 2020-09-22 DIAGNOSIS — I878 Other specified disorders of veins: Secondary | ICD-10-CM

## 2020-09-22 DIAGNOSIS — M79674 Pain in right toe(s): Secondary | ICD-10-CM

## 2020-09-22 NOTE — Progress Notes (Signed)
This patient returns to my office for at risk foot care.  This patient requires this care by a professional since this patient will be at risk due to having diabetes with circulatory disorder.   This patient is unable to cut nails himself since the patient cannot reach his nails.These nails are painful walking and wearing shoes.  This patient presents for at risk foot care today.  General Appearance  Alert, conversant and in no acute stress.  Vascular  Dorsalis pedis and posterior tibial  pulses are weakly/absent  palpable  bilaterally.  Capillary return is within normal limits  bilaterally. Temperature is within normal limits  Bilaterally. Red swollen hot right foot/ankle.  Venous stasis  B/L.  Neurologic  Senn-Weinstein monofilament wire test within normal limits  bilaterally. Muscle power within normal limits bilaterally.  Nails Thick disfigured discolored nails with subungual debris  from hallux to fifth toes bilaterally. No evidence of bacterial infection or drainage bilaterally.  Orthopedic  No limitations of motion  feet .  No crepitus or effusions noted.  No bony pathology or digital deformities noted.  Skin  normotropic skin with no porokeratosis noted bilaterally.  No signs of infections or ulcers noted.     Onychomycosis  Pain in right toes  Pain in left toes  Consent was obtained for treatment procedures.   Mechanical debridement of nails 1-5  bilaterally performed with a nail nipper.  Filed with dremel without incident.    Return office visit   3 months                   Told patient to return for periodic foot care and evaluation due to potential at risk complications.   Gardiner Barefoot DPM

## 2020-09-23 ENCOUNTER — Ambulatory Visit: Payer: Managed Care, Other (non HMO) | Admitting: Pharmacist

## 2020-09-23 ENCOUNTER — Telehealth: Payer: Self-pay | Admitting: Internal Medicine

## 2020-09-23 DIAGNOSIS — E1165 Type 2 diabetes mellitus with hyperglycemia: Secondary | ICD-10-CM

## 2020-09-23 MED ORDER — OZEMPIC (0.25 OR 0.5 MG/DOSE) 2 MG/1.5ML ~~LOC~~ SOPN: 0.5000 mg | PEN_INJECTOR | SUBCUTANEOUS | 3 refills | Status: DC

## 2020-09-23 NOTE — Patient Instructions (Signed)
Visit Information  Goals Addressed              This Visit's Progress     Patient Stated   .  PharmD "I want to work on my sugars" (pt-stated)        [place] (see longtitudinal plan of care for additional care plan information)  Current Barriers:  . Social, financial, and community barriers:  o Hospital doctor issues with filling Ozempic at the pharmacy . Diabetes: uncontrolled; complicated by chronic medical conditions including HTN, HLD, most recent A1c 8.1% . Current antihyperglycemic regimen: metformin 1000 mg BID, Ozempic 0.5 mg weekly - notes that he really appreciates the decrease in appetite . Current blood glucose readings: Date of Download: 09/10/20-09/23/20 % Time CGM is active: 25% Average Glucose: 142 mg/dL Glucose Management Indicator: unable to be calculated due to lack of data Observed patterns: very few readings over the past 14 days, but available readings are generally well controlled. Patient confirms only 1 reading in the past few weeks >200. . Cardiovascular risk reduction: o Current hypertensive regimen: HCTZ 25 mg daily, lisinopril 40 mg daily o Current hyperlipidemia regimen: rosuvastatin 10 mg daily; last LDL at goal <100, could consider more stringent goal of LDL <70 . Supplementation: OTC vitamin D  Pharmacist Clinical Goal(s):  Marland Kitchen Over the next 90 days, patient will work with PharmD and primary care provider to address optimized medication management  Interventions: . Comprehensive medication review performed, medication list updated in electronic medical record . Inter-disciplinary care team collaboration (see longitudinal plan of care) . Called [facility]. They denied having an Ozempic script on file. Resent Ozempic prescription today. Contacted patient to review. Praised for improvement in glucose readings; continue current regimen.  Patient Self Care Activities:  . Patient will check blood glucose using CGM at least QID, and  provide at future appointment . Patient will take medications as prescribed . Patient will report any questions or concerns to provider   Please see past updates related to this goal by clicking on the "Past Updates" button in the selected goal         The patient verbalized understanding of instructions provided today and declined a print copy of patient instruction materials.   Plan:  - Will outreach as previously scheduled  [name], PharmD, [place], [place] Pharmacist [place] [phone]

## 2020-09-23 NOTE — Chronic Care Management (AMB) (Signed)
Chronic Care Management   Follow Up Note   09/23/2020 Name: [name] MRN: [id number] DOB: [birth year]  Referred by: Bradley Pheasant, MD Reason for referral : Chronic Care Management (Medication Management)   [name] is a 58 y.o. year old male who is a primary care patient of Bradley Pheasant, MD. The CCM team was consulted for assistance with chronic disease management and care coordination needs.    Contacted patient for medication management review.  Review of patient status, including review of consultants reports, relevant laboratory and other test results, and collaboration with appropriate care team members and the patient's provider was performed as part of comprehensive patient evaluation and provision of chronic care management services.    SDOH (Social Determinants of Health) assessments performed: No See Care Plan activities for detailed interventions related to [facility])     Outpatient Encounter Medications as of 09/23/2020  Medication Sig  . cholecalciferol (VITAMIN D) 1000 units tablet Take 1,000 Units by mouth daily.  . Continuous Blood Gluc Sensor (FREESTYLE LIBRE 14 DAY SENSOR) MISC Inject 1 Device into the skin every 14 (fourteen) days.  . hydrochlorothiazide (HYDRODIURIL) 25 MG tablet TAKE 1 TABLET(25 MG) BY MOUTH DAILY  . lisinopril (ZESTRIL) 40 MG tablet TAKE 1 TABLET BY MOUTH  DAILY  . loratadine (CLARITIN) 10 MG tablet Take 10 mg by mouth daily as needed for allergies.   . metFORMIN (GLUCOPHAGE) 500 MG tablet TAKE 2 TABLETS BY MOUTH TWICE DAILY  . mupirocin ointment (BACTROBAN) 2 % APPLY TO THE AFFECTED AREA ON LEG TWICE DAILY  . rosuvastatin (CRESTOR) 10 MG tablet TAKE 1 TABLET BY MOUTH  DAILY  . Semaglutide,0.25 or 0.5MG /DOS, (OZEMPIC, 0.25 OR 0.5 MG/DOSE,) 2 MG/1.5ML SOPN Inject 0.5 mg into the skin once a week.  . [DISCONTINUED] OZEMPIC, 0.25 OR 0.5 MG/DOSE, 2 MG/1.5ML SOPN ADMINISTER 0.5 MG UNDER THE SKIN ONCE A WEEK   No facility-administered  encounter medications on file as of 09/23/2020.     Objective:   Goals Addressed              This Visit's Progress     Patient Stated   .  PharmD "I want to work on my sugars" (pt-stated)        [name] (see longtitudinal plan of care for additional care plan information)  Current Barriers:  . Social, financial, and community barriers:  o Hospital doctor issues with filling Ozempic at the pharmacy . Diabetes: uncontrolled; complicated by chronic medical conditions including HTN, HLD, most recent A1c 8.1% . Current antihyperglycemic regimen: metformin 1000 mg BID, Ozempic 0.5 mg weekly - notes that he really appreciates the decrease in appetite . Current blood glucose readings: Date of Download: 09/10/20-09/23/20 % Time CGM is active: 25% Average Glucose: 142 mg/dL Glucose Management Indicator: unable to be calculated due to lack of data Observed patterns: very few readings over the past 14 days, but available readings are generally well controlled. Patient confirms only 1 reading in the past few weeks >200. . Cardiovascular risk reduction: o Current hypertensive regimen: HCTZ 25 mg daily, lisinopril 40 mg daily o Current hyperlipidemia regimen: rosuvastatin 10 mg daily; last LDL at goal <100, could consider more stringent goal of LDL <70 . Supplementation: OTC vitamin D  Pharmacist Clinical Goal(s):  Marland Kitchen Over the next 90 days, patient will work with PharmD and primary care provider to address optimized medication management  Interventions: . Comprehensive medication review performed, medication list updated in electronic medical record .  Inter-disciplinary care team collaboration (see longitudinal plan of care) . Called [facility]. They denied having an Ozempic script on file. Resent Ozempic prescription today. Contacted patient to review. Praised for improvement in glucose readings; continue current regimen.  Patient Self Care Activities:  . Patient will  check blood glucose using CGM at least QID, and provide at future appointment . Patient will take medications as prescribed . Patient will report any questions or concerns to provider   Please see past updates related to this goal by clicking on the "Past Updates" button in the selected goal          Plan:  - Will outreach as previously scheduled  [name], PharmD, [place], [place] Pharmacist [place] [phone]

## 2020-09-23 NOTE — Telephone Encounter (Signed)
Patient is having a problem with his insurance with his OZEMPIC, 0.25 OR 0.5 MG/DOSE, 2 MG/1.5ML SOPN /getting approved. Can you help him with this issure?

## 2020-09-24 ENCOUNTER — Other Ambulatory Visit: Payer: Self-pay | Admitting: Internal Medicine

## 2020-09-30 ENCOUNTER — Other Ambulatory Visit: Payer: Self-pay | Admitting: Internal Medicine

## 2020-09-30 DIAGNOSIS — E1165 Type 2 diabetes mellitus with hyperglycemia: Secondary | ICD-10-CM

## 2020-10-01 ENCOUNTER — Ambulatory Visit: Payer: Managed Care, Other (non HMO) | Admitting: Internal Medicine

## 2020-10-05 ENCOUNTER — Other Ambulatory Visit: Payer: Self-pay | Admitting: Internal Medicine

## 2020-10-05 DIAGNOSIS — Z789 Other specified health status: Secondary | ICD-10-CM

## 2020-10-05 NOTE — Progress Notes (Signed)
Order placed for covid antibody testing.

## 2020-10-05 NOTE — Progress Notes (Signed)
Order placed for covid antibody test.

## 2020-10-24 ENCOUNTER — Ambulatory Visit: Payer: Managed Care, Other (non HMO) | Admitting: Internal Medicine

## 2020-10-27 ENCOUNTER — Telehealth (INDEPENDENT_AMBULATORY_CARE_PROVIDER_SITE_OTHER): Payer: Managed Care, Other (non HMO) | Admitting: Internal Medicine

## 2020-10-27 ENCOUNTER — Other Ambulatory Visit: Payer: Self-pay

## 2020-10-27 ENCOUNTER — Encounter: Payer: Self-pay | Admitting: Internal Medicine

## 2020-10-27 VITALS — Ht 72.99 in | Wt 296.0 lb

## 2020-10-27 DIAGNOSIS — I872 Venous insufficiency (chronic) (peripheral): Secondary | ICD-10-CM

## 2020-10-27 DIAGNOSIS — I890 Lymphedema, not elsewhere classified: Secondary | ICD-10-CM

## 2020-10-27 DIAGNOSIS — D649 Anemia, unspecified: Secondary | ICD-10-CM

## 2020-10-27 DIAGNOSIS — Z789 Other specified health status: Secondary | ICD-10-CM

## 2020-10-27 DIAGNOSIS — I10 Essential (primary) hypertension: Secondary | ICD-10-CM

## 2020-10-27 DIAGNOSIS — E1165 Type 2 diabetes mellitus with hyperglycemia: Secondary | ICD-10-CM

## 2020-10-27 DIAGNOSIS — L989 Disorder of the skin and subcutaneous tissue, unspecified: Secondary | ICD-10-CM

## 2020-10-27 DIAGNOSIS — E1351 Other specified diabetes mellitus with diabetic peripheral angiopathy without gangrene: Secondary | ICD-10-CM

## 2020-10-27 DIAGNOSIS — Z125 Encounter for screening for malignant neoplasm of prostate: Secondary | ICD-10-CM

## 2020-10-27 DIAGNOSIS — E559 Vitamin D deficiency, unspecified: Secondary | ICD-10-CM

## 2020-10-27 DIAGNOSIS — E7800 Pure hypercholesterolemia, unspecified: Secondary | ICD-10-CM

## 2020-10-27 NOTE — Progress Notes (Signed)
[name], male   DOB: [birth year], 59 y.o.   MRN: [id number]   Virtual Visit via video Note  This visit type was conducted due to national recommendations for restrictions regarding the COVID-19 pandemic (e.g. social distancing).  This format is felt to be most appropriate for this patient at this time.  All issues noted in this document were discussed and addressed.  No physical exam was performed (except for noted visual exam findings with Video Visits).   I connected with Juanda Crumble Traum by a video enabled telemedicine application and verified that I am speaking with the correct person using two identifiers. Location patient: home Location provider: work  Persons participating in the virtual visit: patient, provider  The limitations, risks, security and privacy concerns of performing an evaluation and management service by video and the availability of in person appointments have been discussed.  It has also been discussed with the patient that there may be a patient responsible charge related to this service. The patient expressed understanding and agreed to proceed.   Reason for visit: scheduled follow up.    HPI: F/u regarding his blood pressure and blood sugar.  He reports he is doing better.  Feels better.  Tolerating ozempic.  Weight down.  Blood pressure is dong better.  Sugars better - 110-120s.  No chest pain. Reports being more mobile.  Breathing stable. Sleeping better.  No nausea or vomiting reported.  Decreased leg swelling.  Reports no increased redness or soreness.     ROS: See pertinent positives and negatives per HPI.  Past Medical History:  Diagnosis Date  . 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 Depression   . Environmental allergies   . Family history of colon cancer   . GERD (gastroesophageal reflux disease)   . History of chicken pox   . Hyperlipidemia   .  Hypertension   . Venous stasis   . Vitamin D deficiency     Past Surgical History:  Procedure Laterality Date  . CHOLECYSTECTOMY  2002  . COLONOSCOPY WITH PROPOFOL N/A 06/27/2015   Procedure: COLONOSCOPY WITH PROPOFOL;  Surgeon: Lollie Sails, MD;  Location: [facility] ENDOSCOPY;  Service: Endoscopy;  Laterality: N/A;  . HERNIA REPAIR  1962    Family History  Problem Relation Age of Onset  . Stomach cancer Mother   . Arthritis Mother   . Diabetes Mother   . Hypertension Mother   . Hyperlipidemia Father   . Heart disease Father   . Diabetes Father   . Hypertension Father   . Arthritis Maternal Grandmother   . Hyperlipidemia Maternal Grandmother   . Hypertension Maternal Grandmother   . Arthritis Maternal Grandfather   . Hyperlipidemia Maternal Grandfather   . Heart disease Maternal Grandfather   . Hypertension Maternal Grandfather   . Arthritis Paternal Grandmother   . Breast cancer Paternal Grandmother   . Hypertension Paternal Grandmother   . Arthritis Paternal Grandfather   . Hyperlipidemia Paternal Grandfather   . Heart disease Paternal Grandfather   . Diabetes Paternal Grandfather   . Hypertension Paternal Grandfather   . Diabetes Sister     SOCIAL HX: reviewed.    Current Outpatient Medications:  .  cholecalciferol (VITAMIN D) 1000 units tablet, Take 1,000 Units by mouth daily., Disp: , Rfl:  .  Continuous Blood Gluc Sensor (FREESTYLE LIBRE 14 DAY SENSOR) MISC, Inject 1 Device into the skin every  14 (fourteen) days., Disp: 6 each, Rfl: 3 .  hydrochlorothiazide (HYDRODIURIL) 25 MG tablet, TAKE 1 TABLET(25 MG) BY MOUTH DAILY, Disp: 90 tablet, Rfl: 3 .  lisinopril (ZESTRIL) 40 MG tablet, TAKE 1 TABLET BY MOUTH  DAILY, Disp: 90 tablet, Rfl: 3 .  loratadine (CLARITIN) 10 MG tablet, Take 10 mg by mouth daily as needed for allergies. , Disp: , Rfl:  .  metFORMIN (GLUCOPHAGE) 500 MG tablet, TAKE 2 TABLETS BY MOUTH TWICE DAILY, Disp: 360 tablet, Rfl: 1 .  mupirocin ointment  (BACTROBAN) 2 %, APPLY TO THE AFFECTED AREA ON LEG TWICE DAILY, Disp: 22 g, Rfl: 0 .  Na Sulfate-K Sulfate-Mg Sulf 17.5-3.13-1.6 GM/177ML SOLN, Take by mouth., Disp: , Rfl:  .  OZEMPIC, 0.25 OR 0.5 MG/DOSE, 2 MG/1.5ML SOPN, ADMINISTER 0.5 MG UNDER THE SKIN ONCE A WEEK, Disp: 1.5 mL, Rfl: 1 .  rosuvastatin (CRESTOR) 10 MG tablet, TAKE 1 TABLET BY MOUTH  DAILY, Disp: 90 tablet, Rfl: 3  EXAM:  GENERAL: alert, oriented, appears well and in no acute distress  HEENT: atraumatic, conjunttiva clear, no obvious abnormalities on inspection of external nose and ears  NECK: normal movements of the head and neck  LUNGS: on inspection no signs of respiratory distress, breathing rate appears normal, no obvious gross SOB, gasping or wheezing  CV: no obvious cyanosis  PSYCH/NEURO: pleasant and cooperative, no obvious depression or anxiety, speech and thought processing grossly intact  ASSESSMENT AND PLAN:  Discussed the following assessment and plan:  Problem List Items Addressed This Visit    Vitamin D deficiency    Follow vitamin D level.       Type 2 diabetes mellitus with hyperglycemia (HCC)    On ozempic and metformin.  Sugars improved.  Has lost weight.  Follow met b and a1c.       Relevant Orders   Hemoglobin A1c   Microalbumin / creatinine urine ratio   Skin lesion    Previous open wound.  No surrounding erythema.  Swelling has improved.  Declines wound clinic f/u.  Bandaging.  Improved.  Follow.       Secondary diabetes with peripheral circulatory disorder (HCC)    Low carb diet and exercise.  On ozempic and metformin.  Sugars improved.  Swelling improved.        Lymphedema    Has seen [facility] previously.  Continue compression hose.       Hypercholesterolemia    On crestor.  Low cholesterol diet and exercise.  Follow lipid panel and liver function tests.        Relevant Orders   Lipid panel   Hepatic function panel   Essential hypertension    Continue lisinopril and hctz.   Follow pressures.  Doing better.  Follow metabolic panel.       Relevant Orders   CBC with Differential/Platelet   Basic metabolic panel   Chronic venous insufficiency    Continue compression hose.  Leg elevation.  Swelling improved.  With healing stasis lesion - on skin.  No surrounding erythema.  He is bandaging.  Declines would clinic at this time. Follow.        Anemia    Follow cbc.        Other Visit Diagnoses    Prostate cancer screening    -  Primary   Relevant Orders   PSA   Unknown status of immunity to COVID-19 virus       Relevant Orders   SARS-CoV-2 Semi-Quantitative Total Antibody, Spike  I discussed the assessment and treatment plan with the patient. The patient was provided an opportunity to ask questions and all were answered. The patient agreed with the plan and demonstrated an understanding of the instructions.   The patient was advised to call back or seek an in-person evaluation if the symptoms worsen or if the condition fails to improve as anticipated.   [name], MD

## 2020-11-02 ENCOUNTER — Encounter: Payer: Self-pay | Admitting: Internal Medicine

## 2020-11-02 NOTE — Assessment & Plan Note (Signed)
Follow cbc.  

## 2020-11-02 NOTE — Assessment & Plan Note (Signed)
On crestor.  Low cholesterol diet and exercise.  Follow lipid panel and liver function tests.   

## 2020-11-02 NOTE — Assessment & Plan Note (Signed)
Follow vitamin D level.  

## 2020-11-02 NOTE — Assessment & Plan Note (Signed)
Previous open wound.  No surrounding erythema.  Swelling has improved.  Declines wound clinic f/u.  Bandaging.  Improved.  Follow.

## 2020-11-02 NOTE — Assessment & Plan Note (Signed)
Continue compression hose.  Leg elevation.  Swelling improved.  With healing stasis lesion - on skin.  No surrounding erythema.  He is bandaging.  Declines would clinic at this time. Follow.

## 2020-11-02 NOTE — Assessment & Plan Note (Signed)
Continue lisinopril and hctz.  Follow pressures.  Doing better.  Follow metabolic panel.

## 2020-11-02 NOTE — Assessment & Plan Note (Signed)
Low carb diet and exercise.  On ozempic and metformin.  Sugars improved.  Swelling improved.

## 2020-11-02 NOTE — Assessment & Plan Note (Signed)
On ozempic and metformin.  Sugars improved.  Has lost weight.  Follow met b and a1c.  

## 2020-11-02 NOTE — Assessment & Plan Note (Signed)
Has seen [facility] previously.  Continue compression hose.

## 2020-11-04 LAB — BASIC METABOLIC PANEL
BUN/Creatinine Ratio: 21 — ABNORMAL HIGH (ref 9–20)
BUN: 15 mg/dL (ref 6–24)
CO2: 26 mmol/L (ref 20–29)
Calcium: 9.2 mg/dL (ref 8.7–10.2)
Chloride: 97 mmol/L (ref 96–106)
Creatinine, Ser: 0.73 mg/dL — ABNORMAL LOW (ref 0.76–1.27)
GFR calc Af Amer: 117 mL/min/{1.73_m2} (ref >59)
GFR calc non Af Amer: 102 mL/min/{1.73_m2} (ref >59)
Glucose: 143 mg/dL — ABNORMAL HIGH (ref 65–99)
Potassium: 3.7 mmol/L (ref 3.5–5.2)
Sodium: 141 mmol/L (ref 134–144)

## 2020-11-04 LAB — CBC WITH DIFFERENTIAL/PLATELET
Basophils Absolute: 0.1 10*3/uL (ref 0.0–0.2)
Basos: 1 %
EOS (ABSOLUTE): 0.1 10*3/uL (ref 0.0–0.4)
Eos: 1 %
Hematocrit: 43 % (ref 37.5–51.0)
Hemoglobin: 14.2 g/dL (ref 13.0–17.7)
Immature Grans (Abs): 0 10*3/uL (ref 0.0–0.1)
Immature Granulocytes: 0 %
Lymphocytes Absolute: 1.8 10*3/uL (ref 0.7–3.1)
Lymphs: 22 %
MCH: 28.6 pg (ref 26.6–33.0)
MCHC: 33 g/dL (ref 31.5–35.7)
MCV: 87 fL (ref 79–97)
Monocytes Absolute: 0.7 10*3/uL (ref 0.1–0.9)
Monocytes: 9 %
Neutrophils Absolute: 5.5 10*3/uL (ref 1.4–7.0)
Neutrophils: 67 %
Platelets: 285 10*3/uL (ref 150–450)
RBC: 4.96 x10E6/uL (ref 4.14–5.80)
RDW: 13.1 % (ref 11.6–15.4)
WBC: 8.2 10*3/uL (ref 3.4–10.8)

## 2020-11-04 LAB — HEPATIC FUNCTION PANEL
ALT: 13 IU/L (ref 0–44)
AST: 14 IU/L (ref 0–40)
Albumin: 4.1 g/dL (ref 3.8–4.9)
Alkaline Phosphatase: 91 IU/L (ref 44–121)
Bilirubin Total: 0.3 mg/dL (ref 0.0–1.2)
Bilirubin, Direct: 0.12 mg/dL (ref 0.00–0.40)
Total Protein: 6.9 g/dL (ref 6.0–8.5)

## 2020-11-04 LAB — MICROALBUMIN / CREATININE URINE RATIO
Creatinine, Urine: 49.6 mg/dL
Microalb/Creat Ratio: 14 mg/g creat (ref 0–29)
Microalbumin, Urine: 6.7 ug/mL

## 2020-11-04 LAB — LIPID PANEL
Chol/HDL Ratio: 3.8 ratio (ref 0.0–5.0)
Cholesterol, Total: 133 mg/dL (ref 100–199)
HDL: 35 mg/dL — ABNORMAL LOW (ref >39)
LDL Chol Calc (NIH): 76 mg/dL (ref 0–99)
Triglycerides: 121 mg/dL (ref 0–149)
VLDL Cholesterol Cal: 22 mg/dL (ref 5–40)

## 2020-11-04 LAB — HEMOGLOBIN A1C
Est. average glucose Bld gHb Est-mCnc: 163 mg/dL
Hgb A1c MFr Bld: 7.3 % — ABNORMAL HIGH (ref 4.8–5.6)

## 2020-11-04 LAB — SARS-COV-2 SEMI-QUANTITATIVE TOTAL ANTIBODY, SPIKE
SARS-CoV-2 Semi-Quant Total Ab: >2500 U/mL (ref <0.8)
SARS-CoV-2 Spike Ab Interp: POSITIVE

## 2020-11-04 LAB — PSA: Prostate Specific Ag, Serum: 0.5 ng/mL (ref 0.0–4.0)

## 2020-12-01 ENCOUNTER — Telehealth: Payer: Managed Care, Other (non HMO)

## 2020-12-02 ENCOUNTER — Telehealth: Payer: Self-pay | Admitting: Pharmacist

## 2020-12-02 ENCOUNTER — Ambulatory Visit: Payer: Managed Care, Other (non HMO) | Admitting: Pharmacist

## 2020-12-02 DIAGNOSIS — E1165 Type 2 diabetes mellitus with hyperglycemia: Secondary | ICD-10-CM

## 2020-12-02 DIAGNOSIS — E7800 Pure hypercholesterolemia, unspecified: Secondary | ICD-10-CM

## 2020-12-02 DIAGNOSIS — I10 Essential (primary) hypertension: Secondary | ICD-10-CM

## 2020-12-02 NOTE — Chronic Care Management (AMB) (Signed)
Care Management   Pharmacy Note  12/02/2020 Name: [name] MRN: [id number] DOB: September 29, 1961  Subjective: [name] is a 60 y.o. year old male who is a primary care patient of [name], MD. The Care Management team was consulted for assistance with care management and care coordination needs.    Engaged with patient by telephone for follow up visit in response to provider referral for pharmacy case management and/or care coordination services.   Consent to Services:  The patient was given information about care management services, agreed to services, and gave verbal consent prior to initiation of services.  Please see initial visit note for detailed documentation.   Patient agreed to services and consent obtained.  Review of patient status, including review of consultants reports, laboratory and other test data, was performed as part of comprehensive evaluation and provision of chronic care management services.   SDOH (Social Determinants of Health) assessments and interventions performed:  SDOH Interventions   Flowsheet Row Most Recent Value  SDOH Interventions   Financial Strain Interventions Intervention Not Indicated       Objective:  Lab Results  Component Value Date   CREATININE 0.73 (L) 11/03/2020   CREATININE 0.83 07/08/2020   CREATININE 0.94 02/22/2020    Lab Results  Component Value Date   HGBA1C 7.3 (H) 11/03/2020       Component Value Date/Time   CHOL 133 11/03/2020 0823   TRIG 121 11/03/2020 0823   HDL 35 (L) 11/03/2020 0823   CHOLHDL 3.8 11/03/2020 0823   LDLCALC 76 11/03/2020 0823    Clinical ASCVD: No  The 10-year ASCVD risk score [name], et al., 2013) is: 15.6%   Values used to calculate the score:     Age: 31 years     Sex: Male     Is Non-Hispanic African American: No     Diabetic: Yes     Tobacco smoker: No     Systolic Blood Pressure: 716 mmHg     Is BP treated: Yes     HDL Cholesterol: 35 mg/dL     Total  Cholesterol: 133 mg/dL    Other: (CHADS2VASc if Afib, PHQ9 if depression, MMRC or CAT for COPD, ACT, DEXA)  BP Readings from Last 3 Encounters:  06/28/20 128/72  04/13/20 128/78  03/19/20 125/80    Care Plan  No Known Allergies  Medications Reviewed Today    Reviewed by De Hollingshead, RPH-CPP (Pharmacist) on 12/02/20 at 1355  Med List Status: <None>  Medication Order Taking? Sig Documenting Provider Last Dose Status Informant  cholecalciferol (VITAMIN D) 1000 units tablet [id number] Yes Take 1,000 Units by mouth daily. [provider] Taking Active Self  Continuous Blood Gluc Sensor (FREESTYLE LIBRE 14 DAY SENSOR) [place] [id number] Yes Inject 1 Device into the skin every 14 (fourteen) days. [name], MD Taking Active   hydrochlorothiazide (HYDRODIURIL) 25 MG tablet [id number] Yes TAKE 1 TABLET(25 MG) BY MOUTH DAILY [name], MD Taking Active   lisinopril (ZESTRIL) 40 MG tablet [id number] Yes TAKE 1 TABLET BY MOUTH  DAILY [name], MD Taking Active   loratadine (CLARITIN) 10 MG tablet [id number] Yes Take 10 mg by mouth daily as needed for allergies.  [provider] Taking Active   metFORMIN (GLUCOPHAGE) 500 MG tablet [id number] Yes TAKE 2 TABLETS BY MOUTH TWICE DAILY [name], MD Taking Active   mupirocin ointment (BACTROBAN) 2 % [id number] Yes APPLY TO THE AFFECTED AREA ON LEG TWICE DAILY [name], MD  Taking Active   Na Sulfate-K Sulfate-Mg Sulf 17.5-3.13-1.6 GM/177ML SOLN [id number]  Take by mouth. [provider]  Active   OZEMPIC, 0.25 OR 0.5 MG/DOSE, 2 MG/1.5ML SOPN [id number] Yes ADMINISTER 0.5 MG UNDER THE SKIN ONCE A WEEK [name], MD Taking Active   rosuvastatin (CRESTOR) 10 MG tablet [id number] Yes TAKE 1 TABLET BY MOUTH  DAILY [name], MD Taking Active           Patient Active Problem List   Diagnosis Date Noted  . Right foot pain 06/26/2020  . Left leg swelling 03/17/2020  . Skin lesion 02/17/2020   . Type 2 diabetes mellitus with hyperglycemia ([place]) 08/31/2019  . Soft tissue mass 01/08/2019  . Lower extremity edema 02/10/2018  . SOB (shortness of breath) on exertion 07/15/2017  . Chronic venous insufficiency 11/29/2016  . Lymphedema 11/29/2016  . Anemia 05/25/2016  . Hypercholesterolemia 02/29/2016  . Groin pain 09/14/2015  . History of colonic polyps 07/01/2015  . Secondary diabetes with peripheral circulatory disorder ([place]) 03/23/2015  . Essential hypertension 03/23/2015  . Vitamin D deficiency 03/23/2015  . Family history of colon cancer 03/23/2015  . GERD (gastroesophageal reflux disease) 03/23/2015  . BMI 39.0-39.9,adult 03/23/2015    Conditions to be addressed/monitored:  HTN, HLD and DMII  Patient Care Plan: Medication Management    Problem Identified: Diabetes, HLD, HTN     Long-Range Goal: Medication Monitoring   Start Date: 12/02/2020  This Visit's Progress: On track  Priority: High  Note:   Current Barriers:  . Unable to achieve control of diabetes   Pharmacist Clinical Goal(s):  Marland Kitchen Over the next 90 days, patient will achieve control of diabetes as evidenced by A1c, TIR  through collaboration with PharmD and provider.   Interventions: . 1:1 collaboration with [name], MD regarding development and update of comprehensive plan of care as evidenced by provider attestation and co-signature . Inter-disciplinary care team collaboration (see longitudinal plan of care) . Comprehensive medication review performed; medication list updated in electronic medical record  Health Maintenance: . Colonoscopy upcoming. Patient understands prep and medication administration instructions  Diabetes: . Uncontrolled but improved; current treatment: metformin 1000 mg BID, Ozempic 0.5 mg weekly  . Reports weight peaked last fall in 330s lbs, most recent weight 280s (15% weight loss thus far). Denies GI upset/concerns w/ Ozempic . Current glucose readings: Date of  Download: 11/19/20-12/02/20 % Time CGM is active: 46% Average Glucose: 142 mg/dL Glucose Management Indicator: 6.7  Glucose Variability: 17.1 (goal <36%) Time in Goal:  - Time in range 70-180: 93% - Time above range: 7% - Time below range: 0% . Current meal patterns: continues to focus on reduced portion sizes.  . Recommend to continue current therapy at this time. Review that Ozempic does come as 1 mg dose if we desire further weight loss or glycemic support moving forward.   Hypertension: . Controlled per last office reading; current treatment: lisinopril 40 mg daily, HCTZ 25 mg daily   . Recommended to continue current regimen at this time  Hyperlipidemia: . Controlled at goal <100; current treatment: rosuvastatin 10 mg daily   . Could consider more stringent goal of LDL <70 given DM + HTN. Continue to follow. Recommend to continue current regimen at this time  Patient Goals/Self-Care Activities . Over the next 90 days, patient will:  - check glucose TID using CGM, document, and provide at future appointments target a minimum of 150 minutes of moderate intensity exercise weekly engage in dietary modifications by  continuing to focus on reduced portion sizes  Follow Up Plan: Telephone follow up appointment with care management team member scheduled for: ~ 10 weeks     Medication Assistance:  None required. Patient affirms current coverage meets needs.   Follow Up:  Patient agrees to Care Plan and Follow-up.  Plan: Telephone follow up appointment with care management team member scheduled for:  ~ 8 weeks  [name], PharmD, [place], [place] Clinical Pharmacist [employer] at [employer] [phone]

## 2020-12-02 NOTE — Patient Instructions (Signed)
Visit Information  Patient Care Plan: Medication Management    Problem Identified: Diabetes, HLD, HTN     Long-Range Goal: Medication Monitoring   Start Date: 12/02/2020  This Visit's Progress: On track  Priority: High  Note:   Current Barriers:  . Unable to achieve control of diabetes   Pharmacist Clinical Goal(s):  Marland Kitchen Over the next 90 days, patient will achieve control of diabetes as evidenced by A1c, TIR  through collaboration with PharmD and provider.   Interventions: . 1:1 collaboration with [name], MD regarding development and update of comprehensive plan of care as evidenced by provider attestation and co-signature . Inter-disciplinary care team collaboration (see longitudinal plan of care) . Comprehensive medication review performed; medication list updated in electronic medical record  Health Maintenance: . Colonoscopy upcoming. Patient understands prep and medication administration instructions  Diabetes: . Uncontrolled but improved; current treatment: metformin 1000 mg BID, Ozempic 0.5 mg weekly  . Reports weight peaked last fall in 330s lbs, most recent weight 280s (15% weight loss thus far). Denies GI upset/concerns w/ Ozempic . Current glucose readings: Date of Download: 11/19/20-12/02/20 % Time CGM is active: 46% Average Glucose: 142 mg/dL Glucose Management Indicator: 6.7  Glucose Variability: 17.1 (goal <36%) Time in Goal:  - Time in range 70-180: 93% - Time above range: 7% - Time below range: 0% . Current meal patterns: continues to focus on reduced portion sizes.  . Recommend to continue current therapy at this time. Review that Ozempic does come as 1 mg dose if we desire further weight loss or glycemic support moving forward.   Hypertension: . Controlled per last office reading; current treatment: lisinopril 40 mg daily, HCTZ 25 mg daily   . Recommended to continue current regimen at this time  Hyperlipidemia: . Controlled at goal <100; current  treatment: rosuvastatin 10 mg daily   . Could consider more stringent goal of LDL <70 given DM + HTN. Continue to follow. Recommend to continue current regimen at this time  Patient Goals/Self-Care Activities . Over the next 90 days, patient will:  - check glucose TID using CGM, document, and provide at future appointments target a minimum of 150 minutes of moderate intensity exercise weekly engage in dietary modifications by continuing to focus on reduced portion sizes  Follow Up Plan: Telephone follow up appointment with care management team member scheduled for: ~ 10 weeks     The patient verbalized understanding of instructions, educational materials, and care plan provided today and declined offer to receive copy of patient instructions, educational materials, and care plan.   Plan: Telephone follow up appointment with care management team member scheduled for:  ~ 8 weeks  [name], PharmD, [employer], [place] Clinical Pharmacist [employer] [facility] [employer] [phone]

## 2020-12-02 NOTE — Telephone Encounter (Signed)
  Chronic Care Management   Note  12/02/2020 Name: ELDER DAVIDIAN MRN: [id number] DOB: March 20, 1961   Attempted to contact patient for scheduled appointment for medication management support. Left HIPAA compliant message for patient to return my call at their convenience.    Plan: - If I do not hear back from the patient by end of business today, will collaborate with Care Guide to outreach to schedule follow up with me   [name], PharmD, [place], [place] Pharmacist [employer] [facility] [employer] [phone]

## 2020-12-02 NOTE — Telephone Encounter (Signed)
Patient returned call. See CCM documentation 

## 2020-12-10 ENCOUNTER — Other Ambulatory Visit
Admission: RE | Admit: 2020-12-10 | Discharge: 2020-12-10 | Disposition: A | Discharge status: Home / Self Care | Payer: Managed Care, Other (non HMO) | Source: Ambulatory Visit | Attending: Gastroenterology | Admitting: Gastroenterology

## 2020-12-10 ENCOUNTER — Other Ambulatory Visit: Payer: Self-pay

## 2020-12-10 DIAGNOSIS — Z01812 Encounter for preprocedural laboratory examination: Secondary | ICD-10-CM | POA: Insufficient documentation

## 2020-12-10 DIAGNOSIS — Z20822 Contact with and (suspected) exposure to covid-19: Secondary | ICD-10-CM | POA: Diagnosis not present

## 2020-12-10 LAB — SARS CORONAVIRUS 2 (TAT 6-24 HRS): SARS Coronavirus 2: NEGATIVE

## 2020-12-12 ENCOUNTER — Ambulatory Visit
Admission: RE | Admit: 2020-12-12 | Discharge: 2020-12-12 | Disposition: A | Discharge status: Home / Self Care | Payer: Managed Care, Other (non HMO) | Attending: Gastroenterology | Admitting: Gastroenterology

## 2020-12-12 ENCOUNTER — Ambulatory Visit: Payer: Managed Care, Other (non HMO) | Admitting: Certified Registered"

## 2020-12-12 ENCOUNTER — Other Ambulatory Visit: Payer: Self-pay

## 2020-12-12 ENCOUNTER — Encounter: Payer: Self-pay | Admitting: *Deleted

## 2020-12-12 ENCOUNTER — Encounter
Admission: RE | Disposition: A | Discharge status: Home / Self Care | Payer: Self-pay | Source: Home / Self Care | Attending: Gastroenterology

## 2020-12-12 DIAGNOSIS — Z800 Family history of malignant neoplasm of digestive organs: Secondary | ICD-10-CM | POA: Diagnosis not present

## 2020-12-12 DIAGNOSIS — Z86010 Personal history of colonic polyps: Secondary | ICD-10-CM | POA: Diagnosis not present

## 2020-12-12 DIAGNOSIS — Z1211 Encounter for screening for malignant neoplasm of colon: Secondary | ICD-10-CM | POA: Insufficient documentation

## 2020-12-12 DIAGNOSIS — D123 Benign neoplasm of transverse colon: Secondary | ICD-10-CM | POA: Insufficient documentation

## 2020-12-12 DIAGNOSIS — D128 Benign neoplasm of rectum: Secondary | ICD-10-CM | POA: Insufficient documentation

## 2020-12-12 DIAGNOSIS — Z7984 Long term (current) use of oral hypoglycemic drugs: Secondary | ICD-10-CM | POA: Diagnosis not present

## 2020-12-12 DIAGNOSIS — K640 First degree hemorrhoids: Secondary | ICD-10-CM | POA: Insufficient documentation

## 2020-12-12 DIAGNOSIS — Z79899 Other long term (current) drug therapy: Secondary | ICD-10-CM | POA: Insufficient documentation

## 2020-12-12 DIAGNOSIS — I10 Essential (primary) hypertension: Secondary | ICD-10-CM | POA: Insufficient documentation

## 2020-12-12 DIAGNOSIS — E119 Type 2 diabetes mellitus without complications: Secondary | ICD-10-CM | POA: Diagnosis not present

## 2020-12-12 HISTORY — PX: COLONOSCOPY WITH PROPOFOL: SHX5780

## 2020-12-12 LAB — GLUCOSE, CAPILLARY: Glucose-Capillary: 128 mg/dL — ABNORMAL HIGH (ref 70–99)

## 2020-12-12 LAB — HM COLONOSCOPY

## 2020-12-12 SURGERY — COLONOSCOPY WITH PROPOFOL
Anesthesia: General

## 2020-12-12 MED ORDER — PROPOFOL 10 MG/ML IV BOLUS
INTRAVENOUS | Status: DC | PRN
  Administered 2020-12-12: 50 mg via INTRAVENOUS

## 2020-12-12 MED ORDER — PROPOFOL 500 MG/50ML IV EMUL
INTRAVENOUS | Status: DC | PRN
  Administered 2020-12-12: 150 ug/kg/min via INTRAVENOUS

## 2020-12-12 MED ORDER — PROPOFOL 500 MG/50ML IV EMUL
INTRAVENOUS | Status: AC
  Filled 2020-12-12: qty 50

## 2020-12-12 MED ORDER — SODIUM CHLORIDE 0.9 % IV SOLN: INTRAVENOUS | Status: DC

## 2020-12-12 MED ORDER — PROPOFOL 10 MG/ML IV BOLUS
INTRAVENOUS | Status: AC
  Filled 2020-12-12: qty 20

## 2020-12-12 NOTE — Transfer of Care (Signed)
Immediate Anesthesia Transfer of Care Note  Patient: [name]  Procedure(s) Performed: COLONOSCOPY WITH PROPOFOL (N/A )  Patient Location: Endoscopy Unit  Anesthesia Type:General  Level of Consciousness: awake, alert  and oriented  Airway & Oxygen Therapy: Patient Spontanous Breathing and Patient connected to face mask oxygen  Post-op Assessment: Report given to RN and Post -op Vital signs reviewed and stable  Post vital signs: Reviewed and stable  Last Vitals:  Vitals Value Taken Time  BP    Temp    Pulse    Resp    SpO2      Last Pain:  Vitals:   12/12/20 0905  TempSrc: Temporal  PainSc: 0-No pain         Complications: No complications documented.

## 2020-12-12 NOTE — Anesthesia Preprocedure Evaluation (Signed)
Anesthesia Evaluation  Patient identified by MRN, date of birth, ID band Patient awake    Reviewed: Allergy & Precautions, H&P , NPO status , Patient's Chart, lab work & pertinent test results  History of Anesthesia Complications Negative for: history of anesthetic complications  Airway Mallampati: III  TM Distance: <3 FB Neck ROM: full    Dental  (+) Chipped   Pulmonary neg pulmonary ROS, neg shortness of breath,    Pulmonary exam normal        Cardiovascular Exercise Tolerance: Good hypertension, (-) angina+ Peripheral Vascular Disease  (-) Past MI and (-) DOE Normal cardiovascular exam     Neuro/Psych PSYCHIATRIC DISORDERS negative neurological ROS     GI/Hepatic Neg liver ROS, GERD  Medicated and Controlled,  Endo/Other  diabetes, Type 2Morbid obesity  Renal/GU negative Renal ROS  negative genitourinary   Musculoskeletal   Abdominal   Peds  Hematology negative hematology ROS (+)   Anesthesia Other Findings Past Medical History: No date: Controlled diabetes mellitus with peripheral circulatory  disorder (HCC)     Comment:  Pt states controlled by diet for last 4 years No date: Depression No date: Environmental allergies No date: Family history of colon cancer No date: GERD (gastroesophageal reflux disease) No date: History of chicken pox No date: Hyperlipidemia No date: Hypertension No date: Venous stasis No date: Vitamin D deficiency  Past Surgical History: 2002: CHOLECYSTECTOMY 06/27/2015: COLONOSCOPY WITH PROPOFOL; N/A     Comment:  Procedure: COLONOSCOPY WITH PROPOFOL;  Surgeon: Lollie Sails, MD;  [facility]: [facility] ENDOSCOPY;  Service:               Endoscopy;  Laterality: N/A; 1962: HERNIA REPAIR  BMI    Body Mass Index: 37.47 kg/m      Reproductive/Obstetrics negative OB ROS                             Anesthesia Physical Anesthesia Plan  ASA:  III  Anesthesia Plan: General   Post-op Pain Management:    Induction: Intravenous  PONV Risk Score and Plan: Propofol infusion and TIVA  Airway Management Planned: Natural Airway and Nasal Cannula  Additional Equipment:   Intra-op Plan:   Post-operative Plan:   Informed Consent: I have reviewed the patients History and Physical, chart, labs and discussed the procedure including the risks, benefits and alternatives for the proposed anesthesia with the patient or authorized representative who has indicated his/her understanding and acceptance.     Dental Advisory Given  Plan Discussed with: Anesthesiologist, CRNA and Surgeon  Anesthesia Plan Comments: (Patient consented for risks of anesthesia including but not limited to:  - adverse reactions to medications - risk of airway placement if required - damage to eyes, teeth, lips or other oral mucosa - nerve damage due to positioning  - sore throat or hoarseness - Damage to heart, brain, nerves, lungs, other parts of body or loss of life  Patient voiced understanding.)        Anesthesia Quick Evaluation

## 2020-12-12 NOTE — Anesthesia Postprocedure Evaluation (Signed)
Anesthesia Post Note  Patient: [name]  Procedure(s) Performed: COLONOSCOPY WITH PROPOFOL (N/A )  Patient location during evaluation: Endoscopy Anesthesia Type: General Level of consciousness: awake and alert Pain management: pain level controlled Vital Signs Assessment: post-procedure vital signs reviewed and stable Respiratory status: spontaneous breathing, nonlabored ventilation, respiratory function stable and patient connected to nasal cannula oxygen Cardiovascular status: blood pressure returned to baseline and stable Postop Assessment: no apparent nausea or vomiting Anesthetic complications: no   No complications documented.   Last Vitals:  Vitals:   12/12/20 0905 12/12/20 1006  BP: (!) 144/77 (!) 96/57  Pulse: 80   Resp: 16   Temp: (!) 36.4 C (!) 36 C  SpO2: 99%     Last Pain:  Vitals:   12/12/20 1006  TempSrc: Temporal  PainSc: Asleep                 Precious Haws Jerrie Gullo

## 2020-12-12 NOTE — H&P (Signed)
Outpatient short stay form Pre-procedure 12/12/2020 9:27 AM Raylene Miyamoto MD, MPH  Primary Physician: Dr. Nicki Reaper  Reason for visit:  Surveillance colonoscopy  History of present illness:   60 y/o gentleman with history of hypertension and DM II here for surveillance colonoscopy. Had two small TA's on colonoscopy in 2016 but prep was not adequate. No family history of colon cancer but mother with stomach cancer. No blood thinners. No abdominal surgeries.    Current Facility-Administered Medications:  .  0.9 %  sodium chloride infusion, , Intravenous, Continuous, Blaze Sandin, Hilton Cork, MD, Last Rate: 20 mL/hr at 12/12/20 0925, New Bag at 12/12/20 0925  Medications Prior to Admission  Medication Sig Dispense Refill Last Dose  . cholecalciferol (VITAMIN D) 1000 units tablet Take 1,000 Units by mouth daily.   Past Month at Unknown time  . hydrochlorothiazide (HYDRODIURIL) 25 MG tablet TAKE 1 TABLET(25 MG) BY MOUTH DAILY 90 tablet 3 12/11/2020 at Unknown time  . lisinopril (ZESTRIL) 40 MG tablet TAKE 1 TABLET BY MOUTH  DAILY (Patient taking differently: 20 mg.) 90 tablet 3 12/11/2020 at Unknown time  . loratadine (CLARITIN) 10 MG tablet Take 10 mg by mouth daily as needed for allergies.    Past Week at Unknown time  . metFORMIN (GLUCOPHAGE) 500 MG tablet TAKE 2 TABLETS BY MOUTH TWICE DAILY 360 tablet 1 Past Week at Unknown time  . OZEMPIC, 0.25 OR 0.5 MG/DOSE, 2 MG/1.5ML SOPN ADMINISTER 0.5 MG UNDER THE SKIN ONCE A WEEK 1.5 mL 1 Past Week at Unknown time  . rosuvastatin (CRESTOR) 10 MG tablet TAKE 1 TABLET BY MOUTH  DAILY 90 tablet 3 Past Week at Unknown time  . Continuous Blood Gluc Sensor (FREESTYLE LIBRE 14 DAY SENSOR) MISC Inject 1 Device into the skin every 14 (fourteen) days. 6 each 3   . mupirocin ointment (BACTROBAN) 2 % APPLY TO THE AFFECTED AREA ON LEG TWICE DAILY 22 g 0   . Na Sulfate-K Sulfate-Mg Sulf 17.5-3.13-1.6 GM/177ML SOLN Take by mouth.        No Known Allergies   Past  Medical History:  Diagnosis Date  . 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 Depression   . Environmental allergies   . Family history of colon cancer   . GERD (gastroesophageal reflux disease)   . History of chicken pox   . Hyperlipidemia   . Hypertension   . Venous stasis   . Vitamin D deficiency     Review of systems:  Otherwise negative.    Physical Exam  Gen: Alert, oriented. Appears stated age.  HEENT: PERRLA. Lungs: No respiratory distress CV: RRR Abd: soft, benign, no masses Ext: No edema   Planned procedures: Proceed with colonoscopy. The patient understands the nature of the planned procedure, indications, risks, alternatives and potential complications including but not limited to bleeding, infection, perforation, damage to internal organs and possible oversedation/side effects from anesthesia. The patient agrees and gives consent to proceed.  Please refer to procedure notes for findings, recommendations and patient disposition/instructions.     Raylene Miyamoto MD, MPH Gastroenterology 12/12/2020  9:27 AM

## 2020-12-12 NOTE — Interval H&P Note (Signed)
History and Physical Interval Note:  12/12/2020 9:30 AM  [name]  has presented today for surgery, with the diagnosis of Personal History Colon Polyps.  The various methods of treatment have been discussed with the patient and family. After consideration of risks, benefits and other options for treatment, the patient has consented to  Procedure(s): COLONOSCOPY WITH PROPOFOL (N/A) as a surgical intervention.  The patient's history has been reviewed, patient examined, no change in status, stable for surgery.  I have reviewed the patient's chart and labs.  Questions were answered to the patient's satisfaction.     Lesly Rubenstein  Ok to proceed with colonoscopy

## 2020-12-12 NOTE — Op Note (Signed)
[facility] Gastroenterology Patient Name: [name] Procedure Date: 12/12/2020 9:12 AM MRN: [id number] Account #: [id number] Date of Birth: 02-18-1961 Admit Type: Outpatient Age: 60 Room: [facility] ENDO ROOM 3 Gender: Male Note Status: Finalized Procedure:             Colonoscopy Indications:           Surveillance: Personal history of adenomatous polyps                         on last colonoscopy 5 years ago Providers:             Andrey Farmer MD, MD Referring MD:          [name], MD (Referring MD) Medicines:             Monitored Anesthesia Care Complications:         No immediate complications. Estimated blood loss:                         Minimal. Procedure:             Pre-Anesthesia Assessment:                        - Prior to the procedure, a History and Physical was                         performed, and patient medications and allergies were                         reviewed. The patient is competent. The risks and                         benefits of the procedure and the sedation options and                         risks were discussed with the patient. All questions                         were answered and informed consent was obtained.                         Patient identification and proposed procedure were                         verified by the physician, the nurse, the anesthetist                         and the technician in the endoscopy suite. Mental                         Status Examination: alert and oriented. Airway                         Examination: normal oropharyngeal airway and neck                         mobility. Respiratory Examination: clear to  auscultation. CV Examination: normal. Prophylactic                         Antibiotics: The patient does not require prophylactic                         antibiotics. Prior Anticoagulants: The patient has                         taken no previous  anticoagulant or antiplatelet                         agents. ASA Grade Assessment: III - A patient with                         severe systemic disease. After reviewing the risks and                         benefits, the patient was deemed in satisfactory                         condition to undergo the procedure. The anesthesia                         plan was to use monitored anesthesia care (MAC).                         Immediately prior to administration of medications,                         the patient was re-assessed for adequacy to receive                         sedatives. The heart rate, respiratory rate, oxygen                         saturations, blood pressure, adequacy of pulmonary                         ventilation, and response to care were monitored                         throughout the procedure. The physical status of the                         patient was re-assessed after the procedure.                        After obtaining informed consent, the colonoscope was                         passed under direct vision. Throughout the procedure,                         the patient's blood pressure, pulse, and oxygen                         saturations were monitored continuously. The  Colonoscope was introduced through the anus and                         advanced to the the cecum, identified by appendiceal                         orifice and ileocecal valve. The colonoscopy was                         somewhat difficult due to significant looping.                         Successful completion of the procedure was aided by                         applying abdominal pressure. The patient tolerated the                         procedure well. The quality of the bowel preparation                         was good. Findings:      The perianal and digital rectal examinations were normal.      A 3 mm polyp was found in the hepatic flexure. The polyp was  sessile.       The polyp was removed with a cold snare. Resection and retrieval were       complete. Estimated blood loss was minimal.      A 2 mm polyp was found in the rectum. The polyp was sessile. The polyp       was removed with a cold snare. Resection and retrieval were complete.       Estimated blood loss was minimal.      Internal hemorrhoids were found during retroflexion. The hemorrhoids       were Grade I (internal hemorrhoids that do not prolapse).      The exam was otherwise without abnormality on direct and retroflexion       views. Impression:            - One 3 mm polyp at the hepatic flexure, removed with                         a cold snare. Resected and retrieved.                        - One 2 mm polyp in the rectum, removed with a cold                         snare. Resected and retrieved.                        - Internal hemorrhoids.                        - The examination was otherwise normal on direct and                         retroflexion views. Recommendation:        - Discharge patient to home.                        -  Resume previous diet.                        - Continue present medications.                        - Await pathology results.                        - Repeat colonoscopy in 7 years for surveillance.                        - Return to referring physician as previously                         scheduled. Procedure Code(s):     --- Professional ---                        [phone], Colonoscopy, flexible; with removal of                         tumor(s), polyp(s), or other lesion(s) by snare                         technique Diagnosis Code(s):     --- Professional ---                        Z86.010, Personal history of colonic polyps                        K63.5, Polyp of colon                        K62.1, Rectal polyp                        K64.0, First degree hemorrhoids CPT copyright 2019 American Medical Association. All rights  reserved. The codes documented in this report are preliminary and upon coder review may  be revised to meet current compliance requirements. Andrey Farmer MD, MD 12/12/2020 10:06:24 AM Number of Addenda: 0 Note Initiated On: 12/12/2020 9:12 AM Scope Withdrawal Time: 0 hours 17 minutes 26 seconds  Total Procedure Duration: 0 hours 26 minutes 23 seconds  Estimated Blood Loss:  Estimated blood loss was minimal.      [facility]

## 2020-12-15 ENCOUNTER — Encounter: Payer: Self-pay | Admitting: Gastroenterology

## 2020-12-15 LAB — SURGICAL PATHOLOGY

## 2020-12-18 ENCOUNTER — Encounter: Payer: Self-pay | Admitting: *Deleted

## 2020-12-25 ENCOUNTER — Other Ambulatory Visit: Payer: Self-pay

## 2020-12-25 ENCOUNTER — Ambulatory Visit: Payer: Managed Care, Other (non HMO) | Admitting: Podiatry

## 2020-12-25 ENCOUNTER — Encounter: Payer: Self-pay | Admitting: Podiatry

## 2020-12-25 ENCOUNTER — Other Ambulatory Visit: Payer: Self-pay | Admitting: Internal Medicine

## 2020-12-25 DIAGNOSIS — E1351 Other specified diabetes mellitus with diabetic peripheral angiopathy without gangrene: Secondary | ICD-10-CM | POA: Diagnosis not present

## 2020-12-25 DIAGNOSIS — M79674 Pain in right toe(s): Secondary | ICD-10-CM

## 2020-12-25 DIAGNOSIS — B351 Tinea unguium: Secondary | ICD-10-CM

## 2020-12-25 DIAGNOSIS — I890 Lymphedema, not elsewhere classified: Secondary | ICD-10-CM | POA: Diagnosis not present

## 2020-12-25 DIAGNOSIS — I878 Other specified disorders of veins: Secondary | ICD-10-CM

## 2020-12-25 DIAGNOSIS — M79675 Pain in left toe(s): Secondary | ICD-10-CM

## 2020-12-25 NOTE — Progress Notes (Signed)
This patient returns to my office for at risk foot care.  This patient requires this care by a professional since this patient will be at risk due to having diabetes with circulatory disorder.   This patient is unable to cut nails himself since the patient cannot reach his nails.These nails are painful walking and wearing shoes.  This patient presents for at risk foot care today.  General Appearance  Alert, conversant and in no acute stress.  Vascular  Dorsalis pedis and posterior tibial  pulses are weakly/absent  palpable  bilaterally.  Capillary return is within normal limits  bilaterally. Temperature is within normal limits  Bilaterally. Lymphedema left greater than right.  Venous stasis  B/L.  Neurologic  Senn-Weinstein monofilament wire test within normal limits  bilaterally. Muscle power within normal limits bilaterally.  Nails Thick disfigured discolored nails with subungual debris  from hallux to fifth toes bilaterally. No evidence of bacterial infection or drainage bilaterally.  Orthopedic  No limitations of motion  feet .  No crepitus or effusions noted.  No bony pathology or digital deformities noted.  Skin  normotropic skin with no porokeratosis noted bilaterally.  No signs of infections or ulcers noted.     Onychomycosis  Pain in right toes  Pain in left toes  Consent was obtained for treatment procedures.   Mechanical debridement of nails 1-5  bilaterally performed with a nail nipper.  Filed with dremel without incident.    Return office visit   3 months                   Told patient to return for periodic foot care and evaluation due to potential at risk complications.   Alexus Galka DPM  

## 2021-01-04 ENCOUNTER — Other Ambulatory Visit: Payer: Self-pay | Admitting: Internal Medicine

## 2021-01-04 DIAGNOSIS — E1165 Type 2 diabetes mellitus with hyperglycemia: Secondary | ICD-10-CM

## 2021-01-05 ENCOUNTER — Telehealth: Payer: Self-pay | Admitting: Internal Medicine

## 2021-01-05 DIAGNOSIS — E1165 Type 2 diabetes mellitus with hyperglycemia: Secondary | ICD-10-CM

## 2021-01-05 MED ORDER — OZEMPIC (0.25 OR 0.5 MG/DOSE) 2 MG/1.5ML ~~LOC~~ SOPN: PEN_INJECTOR | SUBCUTANEOUS | 1 refills | Status: DC

## 2021-01-05 NOTE — Telephone Encounter (Signed)
Patient would like to have his OZEMPIC, 0.25 OR 0.5 MG/DOSE, 2 MG/1.5ML SOPN refilled.

## 2021-01-06 ENCOUNTER — Encounter: Payer: Self-pay | Admitting: Internal Medicine

## 2021-01-06 MED ORDER — OZEMPIC (0.25 OR 0.5 MG/DOSE) 2 MG/1.5ML ~~LOC~~ SOPN: PEN_INJECTOR | SUBCUTANEOUS | 1 refills | Status: DC

## 2021-01-06 NOTE — Telephone Encounter (Signed)
Please send this medication to [facility] on [address]. Pt already called and canceled the one you sent to [facility].

## 2021-01-06 NOTE — Addendum Note (Signed)
Addended byElpidio Galea T on: 01/06/2021 02:13 PM   Modules accepted: Orders

## 2021-01-07 ENCOUNTER — Other Ambulatory Visit: Payer: Self-pay | Admitting: Internal Medicine

## 2021-01-07 DIAGNOSIS — E1165 Type 2 diabetes mellitus with hyperglycemia: Secondary | ICD-10-CM

## 2021-01-09 NOTE — Telephone Encounter (Signed)
Per chart, rx has been changed.

## 2021-01-20 ENCOUNTER — Ambulatory Visit: Payer: Managed Care, Other (non HMO) | Admitting: Pharmacist

## 2021-01-20 DIAGNOSIS — E1165 Type 2 diabetes mellitus with hyperglycemia: Secondary | ICD-10-CM

## 2021-01-20 DIAGNOSIS — I10 Essential (primary) hypertension: Secondary | ICD-10-CM

## 2021-01-20 DIAGNOSIS — E7800 Pure hypercholesterolemia, unspecified: Secondary | ICD-10-CM

## 2021-01-20 NOTE — Chronic Care Management (AMB) (Signed)
Care Management   Pharmacy Note  01/20/2021 Name: WARDEN BUFFA MRN: [id number] DOB: 1961/04/14  Subjective: Jolene Schimke Pavone is a 60 y.o. year old male who is a primary care patient of [name], MD. The Care Management team was consulted for assistance with care management and care coordination needs.    Engaged with patient by telephone for follow up visit in response to provider referral for pharmacy case management and/or care coordination services.   The patient was given information about Care Management services today including:  1. Care Management services includes personalized support from designated clinical staff supervised by the patient's primary care provider, including individualized plan of care and coordination with other care providers. 2. 24/7 contact phone numbers for assistance for urgent and routine care needs. 3. The patient may stop case management services at any time by phone call to the office staff.  Patient agreed to services and consent obtained.  Assessment:  Review of patient status, including review of consultants reports, laboratory and other test data, was performed as part of comprehensive evaluation and provision of chronic care management services.   SDOH (Social Determinants of Health) assessments and interventions performed:    Objective:  Lab Results  Component Value Date   CREATININE 0.73 (L) 11/03/2020   CREATININE 0.83 07/08/2020   CREATININE 0.94 02/22/2020    Lab Results  Component Value Date   HGBA1C 7.3 (H) 11/03/2020       Component Value Date/Time   CHOL 133 11/03/2020 0823   TRIG 121 11/03/2020 0823   HDL 35 (L) 11/03/2020 0823   CHOLHDL 3.8 11/03/2020 0823   LDLCALC 76 11/03/2020 0823    Other: (TSH, CBC, Vit D, etc.)  Clinical ASCVD: No  The 10-year ASCVD risk score [name], et al., 2013) is: 9.4%   Values used to calculate the score:     Age: 45 years     Sex: Male     Is Non-Hispanic African  American: No     Diabetic: Yes     Tobacco smoker: No     Systolic Blood Pressure: 96 mmHg     Is BP treated: Yes     HDL Cholesterol: 35 mg/dL     Total Cholesterol: 133 mg/dL      BP Readings from Last 3 Encounters:  12/12/20 (!) 96/57  06/28/20 128/72  04/13/20 128/78    Care Plan  No Known Allergies  Medications Reviewed Today    Reviewed by De Hollingshead, RPH-CPP (Pharmacist) on 01/20/21 at 1305  Med List Status: <None>  Medication Order Taking? Sig Documenting Provider Last Dose Status Informant  cholecalciferol (VITAMIN D) 1000 units tablet [id number] Yes Take 1,000 Units by mouth daily. [provider] Taking Active Self  Continuous Blood Gluc Sensor (FREESTYLE LIBRE 14 DAY SENSOR) [place] [id number] Yes USE 1 DEVICE ONTO THE SKIN EVERY 14 DAYS [name], MD Taking Active   hydrochlorothiazide (HYDRODIURIL) 25 MG tablet [id number] Yes TAKE 1 TABLET(25 MG) BY MOUTH DAILY [name], MD Taking Active   lisinopril (ZESTRIL) 40 MG tablet [id number] Yes TAKE 1 TABLET BY MOUTH  DAILY [name], MD Taking Active Self  loratadine (CLARITIN) 10 MG tablet [id number] Yes Take 10 mg by mouth daily as needed for allergies.  [provider] Taking Active   metFORMIN (GLUCOPHAGE) 500 MG tablet [id number] Yes TAKE 2 TABLETS BY MOUTH TWICE DAILY [name], MD Taking Active   mupirocin ointment (BACTROBAN) 2 % [id number] No APPLY TO THE  AFFECTED AREA ON LEG TWICE DAILY  Patient not taking: Reported on 01/20/2021   [name], MD Not Taking Active   OZEMPIC, 0.25 OR 0.5 MG/DOSE, 2 MG/1.5ML Bonney Aid [id number] Yes ADMINISTER 0.5 MG UNDER THE SKIN ONCE A Royetta Asal, Randell Patient, MD Taking Active   rosuvastatin (CRESTOR) 10 MG tablet [id number] Yes TAKE 1 TABLET BY MOUTH  DAILY [name], MD Taking Active           Patient Active Problem List   Diagnosis Date Noted  . Right foot pain 06/26/2020  . Left leg swelling 03/17/2020  . Skin lesion 02/17/2020   . Type 2 diabetes mellitus with hyperglycemia ([place]) 08/31/2019  . Soft tissue mass 01/08/2019  . Lower extremity edema 02/10/2018  . SOB (shortness of breath) on exertion 07/15/2017  . Chronic venous insufficiency 11/29/2016  . Lymphedema 11/29/2016  . Anemia 05/25/2016  . Hypercholesterolemia 02/29/2016  . Groin pain 09/14/2015  . History of colonic polyps 07/01/2015  . Secondary diabetes with peripheral circulatory disorder ([place]) 03/23/2015  . Essential hypertension 03/23/2015  . Vitamin D deficiency 03/23/2015  . Family history of colon cancer 03/23/2015  . GERD (gastroesophageal reflux disease) 03/23/2015  . BMI 39.0-39.9,adult 03/23/2015    Conditions to be addressed/monitored: HTN, HLD and DMII  Care Plan : Medication Management  Updates made by De Hollingshead, RPH-CPP since 01/20/2021 12:00 AM    Problem: Diabetes, HLD, HTN     Long-Range Goal: Medication Monitoring   Start Date: 12/02/2020  Recent Progress: On track  Priority: High  Note:   Current Barriers:  . Unable to achieve control of diabetes   Pharmacist Clinical Goal(s):  Marland Kitchen Over the next 90 days, patient will achieve control of diabetes as evidenced by A1c, TIR  through collaboration with PharmD and provider.   Interventions: . 1:1 collaboration with [name], MD regarding development and update of comprehensive plan of care as evidenced by provider attestation and co-signature . Inter-disciplinary care team collaboration (see longitudinal plan of care) . Comprehensive medication review performed; medication list updated in electronic medical record  Health Maintenance: . Completed colonoscopy. Reports polyps were benign . Notes he had eye exam last year at [facility]. Asked that he have them fax results to our office to update his chart . He notes that he did receive the yearly influenza vaccine, but does not remember the date. Will contact [facility] and then update [place] with the face.    Diabetes: . Uncontrolled but improved; current treatment: metformin 1000 mg BID, Ozempic 0.5 mg weekly  . Maintaining weight in 280-284 lbs, weight peaked last fall in 330s lbs (15% weight loss thus far) . Current glucose readings: Date of Download: 01/07/21-01/20/21 % Time CGM is active: 38% (<70% limits accurate interpretation) Average Glucose: 155 mg/dL Glucose Management Indicator: too little data for interpretation Glucose Variability: 16.1% (goal <36%) Time in Goal:  - Time in range 70-180: 85% - Time above range: 15% - Time below range: 0% . Current meal patterns: continues to focus on reduced portion sizes, increasing physical activity . Recommend to continue current therapy at this time. Overdue for f/u w/ PCP. Will collaborate w/ office staff to call and schedule after 02/05/21 when due for A1c . Review that Ozempic does come as 1 mg dose if we desire further weight loss or glycemic support moving forward.   Hypertension: . Controlled per last office reading; current treatment: lisinopril 40 mg daily, HCTZ 25 mg daily . Not checking home readings.    Marland Kitchen  Recommended to continue current regimen at this time. Can encouraged occasional home BP checks moving forward.   Hyperlipidemia: . Controlled at goal <100; current treatment: rosuvastatin 10 mg daily   . Could consider more stringent goal of LDL <70 given DM + HTN. Continue to follow. PCP visit w/ lab work upcoming, anticipate positive impact of weight loss on lipid panel . Recommend to continue current regimen at this time  Patient Goals/Self-Care Activities . Over the next 90 days, patient will:  - check glucose three times daily using CGM, document, and provide at future appointments target a minimum of 150 minutes of moderate intensity exercise weekly engage in dietary modifications by continuing to focus on reduced portion sizes  Follow Up Plan: Telephone follow up appointment with care management team member scheduled  for: ~ 10 weeks     Medication Assistance:  None required.  Patient affirms current coverage meets needs.  Follow Up:  Patient agrees to Care Plan and Follow-up.  Plan: Telephone follow up appointment with care management team member scheduled for:  ~ 10 weeks  [name], PharmD, [place], [place] Clinical Pharmacist [employer] [facility] [employer] [phone]

## 2021-01-20 NOTE — Patient Instructions (Signed)
Visit Information  Goals Addressed              This Visit's Progress     Patient Stated   .  Medication Monitoring (pt-stated)        Patient Goals/Self-Care Activities . Over the next 90 days, patient will:  - check glucose three times daily using CGM, document, and provide at future appointments target a minimum of 150 minutes of moderate intensity exercise weekly engage in dietary modifications by continuing to focus on reduced portion sizes        The patient verbalized understanding of instructions, educational materials, and care plan provided today and agreed to receive a mailed copy of patient instructions, educational materials, and care plan.   Plan: Telephone follow up appointment with care management team member scheduled for:  ~ 10 weeks  [name], PharmD, [place], [place] Clinical Pharmacist [employer] [facility] [employer] [phone]

## 2021-02-12 ENCOUNTER — Telehealth (INDEPENDENT_AMBULATORY_CARE_PROVIDER_SITE_OTHER): Payer: Managed Care, Other (non HMO) | Admitting: Internal Medicine

## 2021-02-12 ENCOUNTER — Other Ambulatory Visit: Payer: Self-pay

## 2021-02-12 ENCOUNTER — Encounter: Payer: Self-pay | Admitting: Internal Medicine

## 2021-02-12 ENCOUNTER — Telehealth: Payer: Self-pay | Admitting: Internal Medicine

## 2021-02-12 DIAGNOSIS — E1165 Type 2 diabetes mellitus with hyperglycemia: Secondary | ICD-10-CM

## 2021-02-12 DIAGNOSIS — U071 COVID-19: Secondary | ICD-10-CM

## 2021-02-12 DIAGNOSIS — I10 Essential (primary) hypertension: Secondary | ICD-10-CM

## 2021-02-12 NOTE — Progress Notes (Signed)
[name], male   DOB: 1961-01-12, 60 y.o.   MRN: [id number]   Virtual Visit via telephone Note  This visit type was conducted due to national recommendations for restrictions regarding the COVID-19 pandemic (e.g. social distancing).  This format is felt to be most appropriate for this patient at this time.  All issues noted in this document were discussed and addressed.  No physical exam was performed (except for noted visual exam findings with Video Visits).   I connected with [name] by telephone and verified that I am speaking with the correct person using two identifiers. Location patient: home Location provider: work  Persons participating in the telephone visit: patient, provider  The limitations, risks, security and privacy concerns of performing an evaluation and management service by telephone and the availability of in person appointments have been discussed. It has also been discussed with the patient that there may be a patient responsible charge related to this service. The patient expressed understanding and agreed to proceed.   Reason for visit: work in appt  HPI: Work in for covid positive.  States symptoms started five days ago.  Noticed increased nasal congestion and burning in his sinuses.  Increased fatigue.  Some cough.  Sneezing.  No nausea or vomiting.  No diarrhea.  No fever.  No chest pain or chest tightness.  No sob.  Eating soup and drinking fluid.  Chest congestion - breaking up.  Using neti pot.  Pulse ox > 95%.  Blood sugar 100-190.     ROS: See pertinent positives and negatives per HPI.  Past Medical History:  Diagnosis Date  . 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 Depression   . Environmental allergies   . Family history of colon cancer   . GERD (gastroesophageal reflux disease)   . History of chicken pox   . Hyperlipidemia   . Hypertension   .  Venous stasis   . Vitamin D deficiency     Past Surgical History:  Procedure Laterality Date  . CHOLECYSTECTOMY  2002  . COLONOSCOPY WITH PROPOFOL N/A 06/27/2015   Procedure: COLONOSCOPY WITH PROPOFOL;  Surgeon: Lollie Sails, MD;  Location: [facility] ENDOSCOPY;  Service: Endoscopy;  Laterality: N/A;  . COLONOSCOPY WITH PROPOFOL N/A 12/12/2020   Procedure: COLONOSCOPY WITH PROPOFOL;  Surgeon: Lesly Rubenstein, MD;  Location: [facility] ENDOSCOPY;  Service: Endoscopy;  Laterality: N/A;  . HERNIA REPAIR  1962    Family History  Problem Relation Age of Onset  . Stomach cancer Mother   . Arthritis Mother   . Diabetes Mother   . Hypertension Mother   . Hyperlipidemia Father   . Heart disease Father   . Diabetes Father   . Hypertension Father   . Arthritis Maternal Grandmother   . Hyperlipidemia Maternal Grandmother   . Hypertension Maternal Grandmother   . Arthritis Maternal Grandfather   . Hyperlipidemia Maternal Grandfather   . Heart disease Maternal Grandfather   . Hypertension Maternal Grandfather   . Arthritis Paternal Grandmother   . Breast cancer Paternal Grandmother   . Hypertension Paternal Grandmother   . Arthritis Paternal Grandfather   . Hyperlipidemia Paternal Grandfather   . Heart disease Paternal Grandfather   . Diabetes Paternal Grandfather   . Hypertension Paternal Grandfather   . Diabetes Sister     SOCIAL HX: reviewed   Current Outpatient Medications:  .  cholecalciferol (VITAMIN D)  1000 units tablet, Take 1,000 Units by mouth daily., Disp: , Rfl:  .  Continuous Blood Gluc Sensor (FREESTYLE LIBRE 14 DAY SENSOR) MISC, USE 1 DEVICE ONTO THE SKIN EVERY 14 DAYS, Disp: 6 each, Rfl: 3 .  hydrochlorothiazide (HYDRODIURIL) 25 MG tablet, TAKE 1 TABLET(25 MG) BY MOUTH DAILY, Disp: 90 tablet, Rfl: 3 .  lisinopril (ZESTRIL) 40 MG tablet, TAKE 1 TABLET BY MOUTH  DAILY, Disp: 90 tablet, Rfl: 3 .  loratadine (CLARITIN) 10 MG tablet, Take 10 mg by mouth daily as needed for  allergies. , Disp: , Rfl:  .  metFORMIN (GLUCOPHAGE) 500 MG tablet, TAKE 2 TABLETS BY MOUTH TWICE DAILY, Disp: 360 tablet, Rfl: 1 .  mupirocin ointment (BACTROBAN) 2 %, APPLY TO THE AFFECTED AREA ON LEG TWICE DAILY (Patient not taking: Reported on 01/20/2021), Disp: 22 g, Rfl: 0 .  OZEMPIC, 0.25 OR 0.5 MG/DOSE, 2 MG/1.5ML SOPN, ADMINISTER 0.5 MG UNDER THE SKIN ONCE A WEEK, Disp: 15 mL, Rfl: 2 .  rosuvastatin (CRESTOR) 10 MG tablet, TAKE 1 TABLET BY MOUTH  DAILY, Disp: 90 tablet, Rfl: 3  EXAM:  GENERAL: alert. Sounds to be in no acute distress.  Answering questions appropriately.  PSYCH/NEURO: pleasant and cooperative, no obvious depression or anxiety, speech and thought processing grossly intact  ASSESSMENT AND PLAN:  Discussed the following assessment and plan:  Problem List Items Addressed This Visit    COVID-19 virus infection    Diagnosed with covid infection.  With nasal congestion and cough.  No chest pain or sob.  No chest tightness.  Pulse ox >95%.  Discussed outpatient ambulatory treatment. He declines.  Starting to feel some better.  nasacort nasal spray and robitussin DM as directed.  Saline nasal spray as directed.  Rest.  Fluids.  Monitor symptoms.  Call with update.       Essential hypertension    Blood pressure has been doing well.  Continue lisinopril and hctz.        Type 2 diabetes mellitus with hyperglycemia (HCC)    On ozempic and metformin.  Sugars as outlined.  Low carb diet and exercise.  Follow met b and a1c.           I discussed the assessment and treatment plan with the patient. The patient was provided an opportunity to ask questions and all were answered. The patient agreed with the plan and demonstrated an understanding of the instructions.   The patient was advised to call back or seek an in-person evaluation if the symptoms worsen or if the condition fails to improve as anticipated.  I provided 23 minutes of non-face-to-face time during this  encounter.   [name], MD

## 2021-02-12 NOTE — Telephone Encounter (Signed)
My chart message sent to pt for update.   

## 2021-02-15 ENCOUNTER — Encounter: Payer: Self-pay | Admitting: Internal Medicine

## 2021-02-15 DIAGNOSIS — U071 COVID-19: Secondary | ICD-10-CM | POA: Insufficient documentation

## 2021-02-15 NOTE — Assessment & Plan Note (Signed)
Blood pressure has been doing well.  Continue lisinopril and hctz.

## 2021-02-15 NOTE — Assessment & Plan Note (Signed)
On ozempic and metformin.  Sugars as outlined.  Low carb diet and exercise.  Follow met b and a1c.

## 2021-02-15 NOTE — Assessment & Plan Note (Addendum)
Diagnosed with covid infection.  With nasal congestion and cough.  No chest pain or sob.  No chest tightness.  Pulse ox >95%.  Discussed outpatient ambulatory treatment. He declines.  Starting to feel some better.  nasacort nasal spray and robitussin DM as directed.  Saline nasal spray as directed.  Rest.  Fluids.  Monitor symptoms.  Call with update. Discussed quarantine guidelines.

## 2021-02-21 ENCOUNTER — Other Ambulatory Visit: Payer: Self-pay | Admitting: Internal Medicine

## 2021-04-02 ENCOUNTER — Ambulatory Visit: Payer: Managed Care, Other (non HMO) | Admitting: Podiatry

## 2021-04-02 ENCOUNTER — Encounter: Payer: Self-pay | Admitting: Podiatry

## 2021-04-02 ENCOUNTER — Other Ambulatory Visit: Payer: Self-pay

## 2021-04-02 DIAGNOSIS — B351 Tinea unguium: Secondary | ICD-10-CM

## 2021-04-02 DIAGNOSIS — I890 Lymphedema, not elsewhere classified: Secondary | ICD-10-CM

## 2021-04-02 DIAGNOSIS — M79675 Pain in left toe(s): Secondary | ICD-10-CM | POA: Diagnosis not present

## 2021-04-02 DIAGNOSIS — M79674 Pain in right toe(s): Secondary | ICD-10-CM

## 2021-04-02 DIAGNOSIS — E119 Type 2 diabetes mellitus without complications: Secondary | ICD-10-CM

## 2021-04-02 DIAGNOSIS — I878 Other specified disorders of veins: Secondary | ICD-10-CM

## 2021-04-02 NOTE — Progress Notes (Signed)
This patient returns to my office for at risk foot care.  This patient requires this care by a professional since this patient will be at risk due to having diabetes with circulatory disorder.   This patient is unable to cut nails himself since the patient cannot reach his nails.These nails are painful walking and wearing shoes.  This patient presents for at risk foot care today.  General Appearance  Alert, conversant and in no acute stress.  Vascular  Dorsalis pedis and posterior tibial  pulses are weakly/absent  palpable  bilaterally.  Capillary return is within normal limits  bilaterally. Temperature is within normal limits  Bilaterally. Lymphedema left greater than right.  Venous stasis  B/L.  Neurologic  Senn-Weinstein monofilament wire test within normal limits  bilaterally. Muscle power within normal limits bilaterally.  Nails Thick disfigured discolored nails with subungual debris  from hallux to fifth toes bilaterally. No evidence of bacterial infection or drainage bilaterally.  Orthopedic  No limitations of motion  feet .  No crepitus or effusions noted.  No bony pathology or digital deformities noted.  Skin  normotropic skin with no porokeratosis noted bilaterally.  No signs of infections or ulcers noted.     Onychomycosis  Pain in right toes  Pain in left toes  Consent was obtained for treatment procedures.   Mechanical debridement of nails 1-5  bilaterally performed with a nail nipper.  Filed with dremel without incident.    Return office visit   3 months                   Told patient to return for periodic foot care and evaluation due to potential at risk complications.   Osborn Pullin DPM  

## 2021-04-07 ENCOUNTER — Telehealth: Payer: Self-pay | Admitting: Internal Medicine

## 2021-04-07 ENCOUNTER — Ambulatory Visit: Payer: Managed Care, Other (non HMO) | Admitting: Pharmacist

## 2021-04-07 DIAGNOSIS — E1165 Type 2 diabetes mellitus with hyperglycemia: Secondary | ICD-10-CM

## 2021-04-07 DIAGNOSIS — I10 Essential (primary) hypertension: Secondary | ICD-10-CM

## 2021-04-07 DIAGNOSIS — E7800 Pure hypercholesterolemia, unspecified: Secondary | ICD-10-CM

## 2021-04-07 NOTE — Telephone Encounter (Signed)
Received notification from Catie - orders placed for [employer] labs.  He can go by and have drawn at his convenience.  Also needs a f/u appt with me in 3- 4 weeks.

## 2021-04-07 NOTE — Telephone Encounter (Signed)
Mychart sent to patient.

## 2021-04-07 NOTE — Chronic Care Management (AMB) (Signed)
Care Management   Pharmacy Note  04/07/2021 Name: [name] MRN: [id number] DOB: January 27, 1961  Subjective: [name] is a 60 y.o. year old male who is a primary care patient of [name], MD. The Care Management team was consulted for assistance with care management and care coordination needs.    Engaged with patient by telephone for follow up visit in response to provider referral for pharmacy case management and/or care coordination services.   The patient was given information about Care Management services today including:  1. Care Management services includes personalized support from designated clinical staff supervised by the patient's primary care provider, including individualized plan of care and coordination with other care providers. 2. 24/7 contact phone numbers for assistance for urgent and routine care needs. 3. The patient may stop case management services at any time by phone call to the office staff.  Patient agreed to services and consent obtained.  Assessment:  Review of patient status, including review of consultants reports, laboratory and other test data, was performed as part of comprehensive evaluation and provision of chronic care management services.   SDOH (Social Determinants of Health) assessments and interventions performed:  SDOH Interventions   Flowsheet Row Most Recent Value  SDOH Interventions   Financial Strain Interventions Intervention Not Indicated       Objective:  Lab Results  Component Value Date   CREATININE 0.73 (L) 11/03/2020   CREATININE 0.83 07/08/2020   CREATININE 0.94 02/22/2020    Lab Results  Component Value Date   HGBA1C 7.3 (H) 11/03/2020       Component Value Date/Time   CHOL 133 11/03/2020 0823   TRIG 121 11/03/2020 0823   HDL 35 (L) 11/03/2020 0823   CHOLHDL 3.8 11/03/2020 0823   LDLCALC 76 11/03/2020 0823     Clinical ASCVD: No  The 10-year ASCVD risk score [name], et al., 2013) is:  9.4%   Values used to calculate the score:     Age: 48 years     Sex: Male     Is Non-Hispanic African American: No     Diabetic: Yes     Tobacco smoker: No     Systolic Blood Pressure: 96 mmHg     Is BP treated: Yes     HDL Cholesterol: 35 mg/dL     Total Cholesterol: 133 mg/dL    Oth BP Readings from Last 3 Encounters:  12/12/20 (!) 96/57  06/28/20 128/72  04/13/20 128/78    Care Plan  No Known Allergies  Medications Reviewed Today    Reviewed by De Hollingshead, RPH-CPP (Pharmacist) on 04/07/21 at 17  Med List Status: <None>  Medication Order Taking? Sig Documenting Provider Last Dose Status Informant  cholecalciferol (VITAMIN D) 1000 units tablet [id number] Yes Take 1,000 Units by mouth daily. [provider] Taking Active Self  Continuous Blood Gluc Sensor (FREESTYLE LIBRE 14 DAY SENSOR) [place] [id number] Yes USE 1 DEVICE ONTO THE SKIN EVERY 14 DAYS [name], MD Taking Active   hydrochlorothiazide (HYDRODIURIL) 25 MG tablet [id number] Yes TAKE 1 TABLET(25 MG) BY MOUTH DAILY [name], MD Taking Active   lisinopril (ZESTRIL) 40 MG tablet [id number] Yes TAKE 1 TABLET BY MOUTH  DAILY [name], MD Taking Active Self  loratadine (CLARITIN) 10 MG tablet [id number] Yes Take 10 mg by mouth daily as needed for allergies.  [provider] Taking Active   metFORMIN (GLUCOPHAGE) 500 MG tablet [id number] Yes TAKE 2 TABLETS BY MOUTH TWICE DAILY  [name], MD Taking Active            Med Note Lula Olszewski Apr 07, 2021  1:09 PM) 2 tablet daily usually   mupirocin ointment (BACTROBAN) 2 % [id number]  APPLY TO THE AFFECTED AREA ON LEG TWICE DAILY  Patient not taking: Reported on 01/20/2021   [name], MD  Active   OZEMPIC, 0.25 OR 0.5 MG/DOSE, 2 MG/1.5ML Bon[employer] Aid [id number] Yes ADMINISTER 0.5 MG UNDER THE SKIN ONCE A Royetta Asal, Randell Patient, MD Taking Active   rosuvastatin (CRESTOR) 10 MG tablet [id number] Yes TAKE 1 TABLET BY MOUTH  DAILY  [name], MD Taking Active           Patient Active Problem List   Diagnosis Date Noted  . COVID-19 virus infection 02/15/2021  . Right foot pain 06/26/2020  . Left leg swelling 03/17/2020  . Skin lesion 02/17/2020  . Type 2 diabetes mellitus with hyperglycemia ([place]) 08/31/2019  . Soft tissue mass 01/08/2019  . Lower extremity edema 02/10/2018  . SOB (shortness of breath) on exertion 07/15/2017  . Chronic venous insufficiency 11/29/2016  . Lymphedema 11/29/2016  . Anemia 05/25/2016  . Hypercholesterolemia 02/29/2016  . Groin pain 09/14/2015  . History of colonic polyps 07/01/2015  . Secondary diabetes with peripheral circulatory disorder ([place]) 03/23/2015  . Essential hypertension 03/23/2015  . Vitamin D deficiency 03/23/2015  . Family history of colon cancer 03/23/2015  . GERD (gastroesophageal reflux disease) 03/23/2015  . BMI 39.0-39.9,adult 03/23/2015    Conditions to be addressed/monitored: HTN, HLD and DMII  Care Plan : Medication Management  Updates made by De Hollingshead, RPH-CPP since 04/07/2021 12:00 AM    Problem: Diabetes, HLD, HTN     Long-Range Goal: Medication Monitoring   Start Date: 12/02/2020  This Visit's Progress: On track  Recent Progress: On track  Priority: High  Note:   Current Barriers:  . Unable to achieve control of diabetes   Pharmacist Clinical Goal(s):  Marland Kitchen Over the next 90 days, patient will achieve control of diabetes as evidenced by A1c, TIR  through collaboration with PharmD and provider.   Interventions: . 1:1 collaboration with [name], MD regarding development and update of comprehensive plan of care as evidenced by provider attestation and co-signature . Inter-disciplinary care team collaboration (see longitudinal plan of care) . Comprehensive medication review performed; medication list updated in electronic medical record  Health Maintenance: . Due for HIV screening, eye exam, Tdap. Discuss w/ PCP at next  visit . Reports he and [name] have recovered from [place]. No long symptoms.   Diabetes: . Uncontrolled but improved; current treatment: metformin 1000 mg BID - though generally only remembering to take 1000 mg QPM, since he takes all other medications QPM, Ozempic 0.5 mg weekly  . Maintaining weight in 275-275 lbs, weight peaked last fall in 330s lbs . Current glucose readings: Date of Download: Libre 2 % Time CGM is active: 25% (<70% limits accurate interpretation) Average Glucose: 152 mg/dL Glucose Management Indicator: too little data for interpretation Glucose Variability: 13.8% (goal <36%) Time in Goal:  - Time in range 70-180: 89% - Time above range: 11% - Time below range: 0% . Current physical activity: not as much lately, but plans to get back into it next week while on vacation . Discussed that we can continue metformin 1000 mg QPM, as if next A1c is >7%, Ozempic can be increased to 1 mg weekly. Patient verbalized understanding. Collaborating with office staff to schedule  in office follow up. Will collaborate w/ PCP to order future labs to be done at [facility].   Hypertension: . Controlled per last office reading; current treatment: lisinopril 40 mg daily, HCTZ 25 mg QPM  . No issues w/ nocturia with taking HCTZ in the evenings.  . Not checking home readings.    . Recommended to continue current regimen at this time. Discussed diuretic impact of HCTZ, but if not causing concerns, appropriate to continue current regimen.   Hyperlipidemia: . Controlled at goal <100; current treatment: rosuvastatin 10 mg daily   . Recommend to continue current regimen at this time.   Patient Goals/Self-Care Activities . Over the next 90 days, patient will:  - check glucose three times daily using CGM, document, and provide at future appointments target a minimum of 150 minutes of moderate intensity exercise weekly engage in dietary modifications by continuing to focus on reduced portion  sizes  Follow Up Plan: Telephone follow up appointment with care management team member scheduled for: ~ 12 weeks     Medication Assistance:  None required.  Patient affirms current coverage meets needs.  Follow Up:  Patient agrees to Care Plan and Follow-up.  Plan: Telephone follow up appointment with care management team member scheduled for:  ~ 12 weeks  [name], PharmD, [place], [place] Clinical Pharmacist [employer] at [employer] [phone]

## 2021-04-07 NOTE — Patient Instructions (Signed)
Visit Information   Goals Addressed               This Visit's Progress     Patient Stated     Medication Monitoring (pt-stated)        Patient Goals/Self-Care Activities Over the next 90 days, patient will:  - check glucose three times daily using CGM, document, and provide at future appointments target a minimum of 150 minutes of moderate intensity exercise weekly engage in dietary modifications by continuing to focus on reduced portion sizes         Patient verbalizes understanding of instructions provided today and agrees to view in MyChart.   Plan: Telephone follow up appointment with care management team member scheduled for:  ~ 12 weeks  Catie Demetrice Combes, PharmD, BCACP, CPP Clinical Pharmacist [facility] at [place] [phone] 

## 2021-06-02 LAB — HEMOGLOBIN A1C: Hemoglobin A1C: 6.5

## 2021-06-02 LAB — BASIC METABOLIC PANEL
Creatinine: 0.9 (ref 0.6–1.3)
Glucose: 162

## 2021-06-02 LAB — LIPID PANEL
Cholesterol: 142 (ref 0–200)
HDL: 40 (ref 35–70)
LDL Cholesterol: 82
Triglycerides: 111 (ref 40–160)

## 2021-06-02 LAB — COMPREHENSIVE METABOLIC PANEL: GFR calc non Af Amer: 99

## 2021-06-08 ENCOUNTER — Telehealth: Payer: Self-pay | Admitting: Internal Medicine

## 2021-06-08 NOTE — Telephone Encounter (Signed)
FYI for you. 

## 2021-06-08 NOTE — Telephone Encounter (Signed)
While calling pt to screen for his 06/19/21 appointment, pt would like for Dr.Scott to know he had a screening at work and his a1c went from 7.3 to 6.5.

## 2021-06-09 NOTE — Telephone Encounter (Signed)
Mychart sent to patient.

## 2021-06-09 NOTE — Telephone Encounter (Signed)
Great! Continue low carb diet, exercise and current medication.

## 2021-06-17 ENCOUNTER — Ambulatory Visit: Payer: Managed Care, Other (non HMO) | Admitting: Internal Medicine

## 2021-06-17 ENCOUNTER — Other Ambulatory Visit: Payer: Self-pay

## 2021-06-17 ENCOUNTER — Encounter: Payer: Self-pay | Admitting: Internal Medicine

## 2021-06-17 DIAGNOSIS — I890 Lymphedema, not elsewhere classified: Secondary | ICD-10-CM

## 2021-06-17 DIAGNOSIS — E7800 Pure hypercholesterolemia, unspecified: Secondary | ICD-10-CM

## 2021-06-17 DIAGNOSIS — I872 Venous insufficiency (chronic) (peripheral): Secondary | ICD-10-CM | POA: Diagnosis not present

## 2021-06-17 DIAGNOSIS — E1351 Other specified diabetes mellitus with diabetic peripheral angiopathy without gangrene: Secondary | ICD-10-CM | POA: Diagnosis not present

## 2021-06-17 DIAGNOSIS — E1165 Type 2 diabetes mellitus with hyperglycemia: Secondary | ICD-10-CM

## 2021-06-17 DIAGNOSIS — I10 Essential (primary) hypertension: Secondary | ICD-10-CM | POA: Diagnosis not present

## 2021-06-17 DIAGNOSIS — D649 Anemia, unspecified: Secondary | ICD-10-CM

## 2021-06-17 DIAGNOSIS — Z86010 Personal history of colonic polyps: Secondary | ICD-10-CM

## 2021-06-17 NOTE — Progress Notes (Signed)
[name], male   DOB: 17-Nov-1961, 60 y.o.   MRN: [id number]   Subjective:    [name], male    DOB: [birth year], 60 y.o.   MRN: [id number]  HPI This visit occurred during the SARS-CoV-2 public health emergency.  Safety protocols were in place, including screening questions prior to the visit, additional usage of staff PPE, and extensive cleaning of exam room while observing appropriate contact time as indicated for disinfecting solutions.   Patient here for a scheduled follow up.  Here to follow up regarding his blood pressure, diabetes, lower extremity swelling and cholesterol.  He is doing better.  Has adjusted his diet.  Lost weight.  Lower extremity swelling is better.  Discussed continued diet and exercise.  No chest pain reported.  Breathing stable.  No acid reflux.  No abdominal pain.  Bowels moving.  Reviewed CGM report.  (Use - 36%).  Target range 92%.  Discussed checking sugars.  Last a1c improved.    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 ([place])    Pt states controlled by diet for last 4 years   Depression    Environmental allergies    Family history of colon cancer    GERD (gastroesophageal reflux disease)    History of chicken pox    Hyperlipidemia    Hypertension    Venous stasis    Vitamin D deficiency    Past Surgical History:  Procedure Laterality Date   CHOLECYSTECTOMY  2002   COLONOSCOPY WITH PROPOFOL N/A 06/27/2015   Procedure: COLONOSCOPY WITH PROPOFOL;  Surgeon: Lollie Sails, MD;  Location: [facility] ENDOSCOPY;  Service: Endoscopy;  Laterality: N/A;   COLONOSCOPY WITH PROPOFOL N/A 12/12/2020   Procedure: COLONOSCOPY WITH PROPOFOL;  Surgeon: Lesly Rubenstein, MD;  Location: [facility] ENDOSCOPY;  Service: Endoscopy;  Laterality: N/A;   HERNIA REPAIR  1962   Family History  Problem Relation Age of Onset   Stomach cancer Mother    Arthritis Mother    Diabetes Mother     Hypertension Mother    Hyperlipidemia Father    Heart disease Father    Diabetes Father    Hypertension Father    Arthritis Maternal Grandmother    Hyperlipidemia Maternal Grandmother    Hypertension Maternal Grandmother    Arthritis Maternal Grandfather    Hyperlipidemia Maternal Grandfather    Heart disease Maternal Grandfather    Hypertension Maternal Grandfather    Arthritis Paternal Grandmother    Breast cancer Paternal Grandmother    Hypertension Paternal Grandmother    Arthritis Paternal Grandfather    Hyperlipidemia Paternal Grandfather    Heart disease Paternal Grandfather    Diabetes Paternal Grandfather    Hypertension Paternal Grandfather    Diabetes Sister    Social History   Socioeconomic History   Marital status: Widowed    Spouse name: Not on file   Number of children: Not on file   Years of education: Not on file   Highest education level: Not on file  Occupational History   Not on file  Tobacco Use   Smoking status: Never   Smokeless tobacco: Never  Vaping Use   Vaping Use: Never used  Substance and Sexual Activity   Alcohol use: Yes    Alcohol/week: 0.0 - 1.0 standard drinks    Comment: 1 drink every 2 months   Drug use: No   Sexual activity: Not on file  Other Topics  Concern   Not on file  Social History Narrative   Not on file   Social Determinants of Health   Financial Resource Strain: Low Risk    Difficulty of Paying Living Expenses: Not hard at all  Food Insecurity: Not on file  Transportation Needs: Not on file  Physical Activity: Not on file  Stress: Not on file  Social Connections: Not on file    Review of Systems  Constitutional:  Negative for appetite change and unexpected weight change.  HENT:  Negative for congestion and sinus pressure.   Respiratory:  Negative for cough, chest tightness and shortness of breath.   Cardiovascular:  Negative for chest pain and palpitations.       Leg swelling improved.     Gastrointestinal:  Negative for abdominal pain, diarrhea, nausea and vomiting.  Genitourinary:  Negative for difficulty urinating and dysuria.  Musculoskeletal:  Negative for joint swelling and myalgias.  Skin:  Negative for color change and rash.       Chronic stasis changes - lower extremity.    Neurological:  Negative for dizziness, light-headedness and headaches.  Psychiatric/Behavioral:  Negative for agitation and dysphoric mood.       Objective:    Physical Exam Constitutional:      General: He is not in acute distress.    Appearance: Normal appearance. He is well-developed.  HENT:     Head: Normocephalic and atraumatic.     Right Ear: External ear normal.     Left Ear: External ear normal.  Eyes:     General: No scleral icterus.       Right eye: No discharge.        Left eye: No discharge.  Cardiovascular:     Rate and Rhythm: Normal rate and regular rhythm.  Pulmonary:     Effort: Pulmonary effort is normal. No respiratory distress.     Breath sounds: Normal breath sounds.  Abdominal:     General: Bowel sounds are normal.     Palpations: Abdomen is soft.     Tenderness: There is no abdominal tenderness.  Musculoskeletal:        General: No tenderness.     Cervical back: Neck supple. No tenderness.     Comments: Lower extremity swelling - improved.    Lymphadenopathy:     Cervical: No cervical adenopathy.  Skin:    Findings: No erythema or rash.     Comments: Chronic stasis changes as outlined.   Neurological:     Mental Status: He is alert.  Psychiatric:        Mood and Affect: Mood normal.        Behavior: Behavior normal.    BP 132/76   Pulse 74   Temp 98 F (36.7 C)   Resp 16   Ht _0  (1.854 m)   Wt 293 lb 9.6 oz (133.2 kg)   SpO2 98%   BMI 38.74 kg/m  Wt Readings from Last 3 Encounters:  06/17/21 293 lb 9.6 oz (133.2 kg)  12/12/20 284 lb (128.8 kg)  10/27/20 296 lb (134.3 kg)    Outpatient Encounter Medications as of 06/17/2021   Medication Sig   cholecalciferol (VITAMIN D) 1000 units tablet Take 1,000 Units by mouth daily.   Continuous Blood Gluc Sensor (FREESTYLE LIBRE 14 DAY SENSOR) MISC USE 1 DEVICE ONTO THE SKIN EVERY 14 DAYS   hydrochlorothiazide (HYDRODIURIL) 25 MG tablet TAKE 1 TABLET(25 MG) BY MOUTH DAILY   lisinopril (ZESTRIL) 40 MG tablet  TAKE 1 TABLET BY MOUTH  DAILY   loratadine (CLARITIN) 10 MG tablet Take 10 mg by mouth daily as needed for allergies.    metFORMIN (GLUCOPHAGE) 500 MG tablet TAKE 2 TABLETS BY MOUTH TWICE DAILY   OZEMPIC, 0.25 OR 0.5 MG/DOSE, 2 MG/1.5ML SOPN ADMINISTER 0.5 MG UNDER THE SKIN ONCE A WEEK   [DISCONTINUED] mupirocin ointment (BACTROBAN) 2 % APPLY TO THE AFFECTED AREA ON LEG TWICE DAILY (Patient not taking: Reported on 01/20/2021)   [DISCONTINUED] rosuvastatin (CRESTOR) 10 MG tablet TAKE 1 TABLET BY MOUTH  DAILY   No facility-administered encounter medications on file as of 06/17/2021.     Lab Results  Component Value Date   WBC 8.2 11/03/2020   HGB 14.2 11/03/2020   HCT 43.0 11/03/2020   PLT 285 11/03/2020   GLUCOSE 143 (H) 11/03/2020   CHOL 142 06/02/2021   TRIG 111 06/02/2021   HDL 40 06/02/2021   LDLCALC 82 06/02/2021   ALT 13 11/03/2020   AST 14 11/03/2020   NA 141 11/03/2020   K 3.7 11/03/2020   CL 97 11/03/2020   CREATININE 0.9 06/02/2021   BUN 15 11/03/2020   CO2 26 11/03/2020   TSH 1.560 07/08/2020   HGBA1C 6.5 06/02/2021       Assessment & Plan:   Problem List Items Addressed This Visit     Anemia    Follow cbc.  Colonoscopy as outlined.        Chronic venous insufficiency    Continue compression hose.  Continue leg elevation.  Swelling improved.  Skin - improved.  Follow.        Essential hypertension    Continue lisinopril and hctz.  Follow pressures.  Doing better.  Follow metabolic panel.        History of colonic polyps    Colonoscopy 12/12/20 - polyp hepatic flexure, polyp - rectum and internal hemorrhoids.         Hypercholesterolemia    On crestor.  Low cholesterol diet and exercise.  Follow lipid panel and liver function tests.         Lymphedema    Has seen [facility].  Continue compression hose. Legs improved.        Secondary diabetes with peripheral circulatory disorder (HCC)    Low carb diet and exercise.  On ozempic and metformin.  Sugars improved.  Swelling improved.         Type 2 diabetes mellitus with hyperglycemia (HCC)    On ozempic and metformin.  Sugars have improved.  Discussed continue diet and exercise  - for elevated sugar.  Follow met b and a1c.           [name], MD

## 2021-06-18 ENCOUNTER — Other Ambulatory Visit: Payer: Self-pay | Admitting: Internal Medicine

## 2021-06-19 ENCOUNTER — Ambulatory Visit: Payer: Managed Care, Other (non HMO) | Admitting: Internal Medicine

## 2021-06-22 ENCOUNTER — Encounter: Payer: Self-pay | Admitting: Internal Medicine

## 2021-06-22 NOTE — Assessment & Plan Note (Signed)
Low carb diet and exercise.  On ozempic and metformin.  Sugars improved.  Swelling improved.

## 2021-06-22 NOTE — Assessment & Plan Note (Signed)
On ozempic and metformin.  Sugars have improved.  Discussed continue diet and exercise  - for elevated sugar.  Follow met b and a1c.

## 2021-06-22 NOTE — Assessment & Plan Note (Signed)
Colonoscopy 12/12/20 - polyp hepatic flexure, polyp - rectum and internal hemorrhoids.

## 2021-06-22 NOTE — Assessment & Plan Note (Signed)
Follow cbc.  Colonoscopy as outlined.

## 2021-06-22 NOTE — Assessment & Plan Note (Signed)
Has seen [facility].  Continue compression hose. Legs improved.

## 2021-06-22 NOTE — Assessment & Plan Note (Signed)
On crestor.  Low cholesterol diet and exercise.  Follow lipid panel and liver function tests.   

## 2021-06-22 NOTE — Assessment & Plan Note (Signed)
Continue lisinopril and hctz.  Follow pressures.  Doing better.  Follow metabolic panel.

## 2021-06-22 NOTE — Assessment & Plan Note (Signed)
Continue compression hose.  Continue leg elevation.  Swelling improved.  Skin - improved.  Follow.  

## 2021-06-24 ENCOUNTER — Ambulatory Visit: Payer: Managed Care, Other (non HMO) | Admitting: Pharmacist

## 2021-06-24 DIAGNOSIS — E559 Vitamin D deficiency, unspecified: Secondary | ICD-10-CM

## 2021-06-24 DIAGNOSIS — E1165 Type 2 diabetes mellitus with hyperglycemia: Secondary | ICD-10-CM

## 2021-06-24 DIAGNOSIS — E7800 Pure hypercholesterolemia, unspecified: Secondary | ICD-10-CM

## 2021-06-24 DIAGNOSIS — I10 Essential (primary) hypertension: Secondary | ICD-10-CM

## 2021-06-24 MED ORDER — METFORMIN HCL ER 500 MG PO TB24: 1000.0000 mg | ORAL_TABLET | Freq: Every day | ORAL | 3 refills | Status: DC

## 2021-06-24 MED ORDER — OZEMPIC (0.25 OR 0.5 MG/DOSE) 2 MG/1.5ML ~~LOC~~ SOPN: 0.5000 mg | PEN_INJECTOR | SUBCUTANEOUS | 3 refills | Status: DC

## 2021-06-24 NOTE — Patient Instructions (Signed)
Visit Information   Goals Addressed               This Visit's Progress     Patient Stated     Medication Monitoring (pt-stated)        Patient Goals/Self-Care Activities Over the next 90 days, patient will:  - check glucose three times daily using CGM, document, and provide at future appointments target a minimum of 150 minutes of moderate intensity exercise weekly engage in dietary modifications by continuing to focus on reduced portion sizes         Patient verbalizes understanding of instructions provided today and agrees to view in [name].   Plan: Telephone follow up appointment with care management team member scheduled for:  ~ 12 weeks  [name], PharmD, [place], [place] Clinical Pharmacist [employer] at [employer] [phone]

## 2021-06-24 NOTE — Chronic Care Management (AMB) (Signed)
**Note Bradley-Identified via Obfuscation** Care Management   Pharmacy Note  06/24/2021 Name: [name] MRN: [id number] DOB: 05-24-1961  Subjective: [name] is a 60 y.o. year old male who is a primary care [name] of Bradley Pheasant, MD. The Care Management team was consulted for assistance with care management and care coordination needs.    Engaged with [name] by telephone for follow up visit in response to provider referral for pharmacy case management and/or care coordination services.   The [name] was given information about Care Management services today including:  Care Management services includes personalized support from designated clinical staff supervised by the [name]'s primary care provider, including individualized plan of care and coordination with other care providers. 24/7 contact phone numbers for assistance for urgent and routine care needs. The [name] may stop case management services at any time by phone call to the office staff.  [name] agreed to services and consent obtained.  Assessment:  Review of [name] status, including review of consultants reports, laboratory and other test data, was performed as part of comprehensive evaluation and provision of chronic care management services.   SDOH (Social Determinants of Health) assessments and interventions performed:  SDOH Interventions    Flowsheet Row Most Recent Value  SDOH Interventions   Financial Strain Interventions Intervention Not Indicated        Objective:  Lab Results  Component Value Date   CREATININE 0.9 06/02/2021   CREATININE 0.73 (L) 11/03/2020   CREATININE 0.83 07/08/2020    Lab Results  Component Value Date   HGBA1C 6.5 06/02/2021       Component Value Date/Time   CHOL 142 06/02/2021 0000   CHOL 133 11/03/2020 0823   TRIG 111 06/02/2021 0000   HDL 40 06/02/2021 0000   HDL 35 (L) 11/03/2020 0823   CHOLHDL 3.8 11/03/2020 0823   LDLCALC 82 06/02/2021 0000   LDLCALC 76 11/03/2020 0823   Last vitamin  D Lab Results  Component Value Date   VD25OH 32.1 07/31/2019     Clinical ASCVD: No  The 10-year ASCVD risk score [name]., et al., 2013) is: 16.8%*   Values used to calculate the score:     Age: 47 years     Sex: Male     Is Non-Hispanic African American: No     Diabetic: Yes     Tobacco smoker: No     Systolic Blood Pressure: [id number] mmHg     Is BP treated: Yes     HDL Cholesterol: 40 mg/dL*     Total Cholesterol: 142 mg/dL*     * - Cholesterol units were assumed for this score calculation     BP Readings from Last 3 Encounters:  06/17/21 132/76  12/12/20 (!) 96/57  06/28/20 128/72    Care Plan  No Known Allergies  Medications Reviewed Today     Reviewed by [name], RPH-CPP (Pharmacist) on 06/24/21 at 1311  Med List Status: <None>   Medication Order Taking? Sig Documenting Provider Last Dose Status Informant  cholecalciferol (VITAMIN D) 1000 units tablet [id number] Yes Take 1,000 Units by mouth daily. [provider] Taking Active Self  Continuous Blood Gluc Sensor (FREESTYLE LIBRE 14 DAY SENSOR) [place] [id number] Yes USE 1 DEVICE ONTO THE SKIN EVERY 14 DAYS Bradley Pheasant, MD Taking Active   hydrochlorothiazide (HYDRODIURIL) 25 MG tablet [id number] Yes TAKE 1 TABLET(25 MG) BY MOUTH DAILY Bradley Pheasant, MD Taking Active   lisinopril (ZESTRIL) 40 MG tablet [id number] Yes TAKE 1 TABLET BY MOUTH  DAILY Bradley Pheasant, MD Taking Active Self  loratadine (CLARITIN) 10 MG tablet [id number] Yes Take 10 mg by mouth daily as needed for allergies.  [provider] Taking Active   Discontinued 06/24/21 1308            Med Note [name], [place]   Wed Jun 24, 2021  1:06 PM) Taking 1000 mg daily  OZEMPIC, 0.25 OR 0.5 MG/DOSE, 2 MG/1.5ML SOPN [id number] Yes ADMINISTER 0.5 MG UNDER THE SKIN ONCE A [name], Bradley Patient, MD Taking Active   rosuvastatin (CRESTOR) 10 MG tablet [id number] Yes TAKE 1 TABLET BY MOUTH  DAILY Bradley Pheasant, MD Taking Active              [name] Active Problem List   Diagnosis Date Noted   COVID-19 virus infection 02/15/2021   Right foot pain 06/26/2020   Left leg swelling 03/17/2020   Skin lesion 02/17/2020   Type 2 diabetes mellitus with hyperglycemia ([place]) 08/31/2019   Soft tissue mass 01/08/2019   Lower extremity edema 02/10/2018   SOB (shortness of breath) on exertion 07/15/2017   Chronic venous insufficiency 11/29/2016   Lymphedema 11/29/2016   Anemia 05/25/2016   Hypercholesterolemia 02/29/2016   Groin pain 09/14/2015   History of colonic polyps 07/01/2015   Secondary diabetes with peripheral circulatory disorder ([place]) 03/23/2015   Essential hypertension 03/23/2015   Vitamin D deficiency 03/23/2015   Family history of colon cancer 03/23/2015   GERD (gastroesophageal reflux disease) 03/23/2015   BMI 39.0-39.9,adult 03/23/2015    Conditions to be addressed/monitored: HTN, HLD, and DMII  Care Plan : Medication Management  Updates made by [name], RPH-CPP since 06/24/2021 12:00 AM     Problem: Diabetes, HLD, HTN      Long-Range Goal: Medication Monitoring   Start Date: 12/02/2020  This Visit's Progress: On track  Recent Progress: On track  Priority: High  Note:   Current Barriers:  Unable to achieve control of diabetes   Pharmacist Clinical Goal(s):  Over the next 90 days, [name] will achieve control of diabetes as evidenced by A1c, TIR  through collaboration with PharmD and provider.   Interventions: 1:1 collaboration with Bradley Pheasant, MD regarding development and update of comprehensive plan of care as evidenced by provider attestation and co-signature Inter-disciplinary care team collaboration (see longitudinal plan of care) Comprehensive medication review performed; medication list updated in electronic medical record  Health Maintenance: Due for HIV screening, eye exam, Tdap. Discuss w/ PCP at next visit  Diabetes: Controlled; current treatment: metformin 1000  mg BID - though generally only remembering to take 1000 mg QPM, since he takes all other medications QPM, Ozempic 0.5 mg weekly  Current glucose readings: Date of Download: Libre 14 day % Time CGM is active: 22% (<70% limits accurate interpretation) Average Glucose: 145 mg/dL Glucose Management Indicator: too little data for interpretation Glucose Variability: 17.4% (goal <36%) Time in Goal:  - Time in range 70-180: 90% - Time above range: 10% - Time below range: 0% Current physical activity: reports he plans to walk more Praised for improvement in A1c. Encouraged continued focus on physical activity, dietary modification. Discussed that XR metformin would provide better coverage if he prefers to just take 2 tablets at night. Complete home supply of metformin IR, but then start metformin XR 1000 mg (2 500 mg tablets) QPM Continue Ozempic 0.5 mg weekly. Discussed availability of higher doses moving forward.  Reviewed benefit of scanning CGM more frequently to remain accountable, ensure he does not lose  glycemic control. He vrbalized understanding.  Hypertension: Controlled per last office reading; current treatment: lisinopril 40 mg daily, HCTZ 25 mg QPM (denies nocturia concerns w/ taking HCTZ QPM) Discussed benefit of home BP readings to evaluate home ambulatory control. Advised to consider purchasing a home BP monitor and check periodically. He verbalized understanding.  Recommended to continue current regimen at this time.  Hyperlipidemia: Controlled at goal <100; current treatment: rosuvastatin 10 mg daily   Praised for continued control of lipids Recommend to continue current regimen at this time.   Supplements: Vitamin D 1000 units daily  [name] Goals/Self-Care Activities Over the next 90 days, [name] will:  - check glucose three times daily using CGM, document, and provide at future appointments target a minimum of 150 minutes of moderate intensity exercise weekly engage in  dietary modifications by continuing to focus on reduced portion sizes  Follow Up Plan: Telephone follow up appointment with care management team member scheduled for: ~ 12 weeks. Offered to close out CCM follow up, but [name] prefers to continue collaboration.     Medication Assistance:  None required.  [name] affirms current coverage meets needs.  Follow Up:  [name] agrees to Care Plan and Follow-up.  Plan: Telephone follow up appointment with care management team member scheduled for:  ~ 12 weeks  Catie [name], PharmD, [place], [place] Clinical Pharmacist [employer] at [employer] [phone]

## 2021-07-02 ENCOUNTER — Other Ambulatory Visit: Payer: Self-pay | Admitting: Internal Medicine

## 2021-07-09 ENCOUNTER — Ambulatory Visit: Payer: Managed Care, Other (non HMO) | Admitting: Podiatry

## 2021-07-09 ENCOUNTER — Other Ambulatory Visit: Payer: Self-pay

## 2021-07-09 ENCOUNTER — Encounter: Payer: Self-pay | Admitting: Podiatry

## 2021-07-09 DIAGNOSIS — I890 Lymphedema, not elsewhere classified: Secondary | ICD-10-CM

## 2021-07-09 DIAGNOSIS — I878 Other specified disorders of veins: Secondary | ICD-10-CM

## 2021-07-09 DIAGNOSIS — M79674 Pain in right toe(s): Secondary | ICD-10-CM | POA: Diagnosis not present

## 2021-07-09 DIAGNOSIS — B351 Tinea unguium: Secondary | ICD-10-CM

## 2021-07-09 DIAGNOSIS — M79675 Pain in left toe(s): Secondary | ICD-10-CM | POA: Diagnosis not present

## 2021-07-09 DIAGNOSIS — E119 Type 2 diabetes mellitus without complications: Secondary | ICD-10-CM

## 2021-07-09 NOTE — Progress Notes (Signed)
This patient returns to my office for at risk foot care.  This patient requires this care by a professional since this patient will be at risk due to having diabetes with circulatory disorder.   This patient is unable to cut nails himself since the patient cannot reach his nails.These nails are painful walking and wearing shoes.  This patient presents for at risk foot care today.  General Appearance  Alert, conversant and in no acute stress.  Vascular  Dorsalis pedis and posterior tibial  pulses are weakly/absent  palpable  bilaterally.  Capillary return is within normal limits  bilaterally. Temperature is within normal limits  Bilaterally. Lymphedema left greater than right.  Venous stasis  B/L.  Neurologic  Senn-Weinstein monofilament wire test within normal limits  bilaterally. Muscle power within normal limits bilaterally.  Nails Thick disfigured discolored nails with subungual debris  from hallux to fifth toes bilaterally. No evidence of bacterial infection or drainage bilaterally.  Orthopedic  No limitations of motion  feet .  No crepitus or effusions noted.  No bony pathology or digital deformities noted.  Skin  normotropic skin with no porokeratosis noted bilaterally.  No signs of infections or ulcers noted.     Onychomycosis  Pain in right toes  Pain in left toes  Consent was obtained for treatment procedures.   Mechanical debridement of nails 1-5  bilaterally performed with a nail nipper.  Filed with dremel without incident.    Return office visit   3 months                   Told patient to return for periodic foot care and evaluation due to potential at risk complications.   Aslin Farinas DPM  

## 2021-09-04 ENCOUNTER — Other Ambulatory Visit: Payer: Self-pay | Admitting: Internal Medicine

## 2021-09-17 ENCOUNTER — Telehealth: Payer: Managed Care, Other (non HMO)

## 2021-09-21 IMAGING — US US EXTREM LOW VENOUS*L*
1 series · 14 of 24 positions shown · non-contrast
Comparison: None.

CLINICAL DATA: Left lower extremity swelling.

EXAM:
Left LOWER EXTREMITY VENOUS DOPPLER ULTRASOUND
TECHNIQUE: Gray-scale sonography with compression, as well as color and duplex
ultrasound, were performed to evaluate the deep venous system(s)
from the level of the common femoral vein through the popliteal and
proximal calf veins.

[Series 1: us venous img lower uni left (dvt) · portal-venous · 14 of 28 slices shown]
[im 1/28]
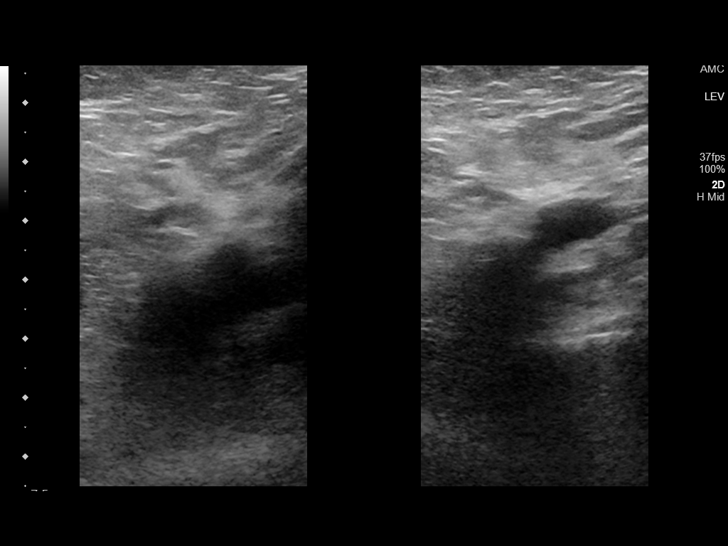
[im 3/28]
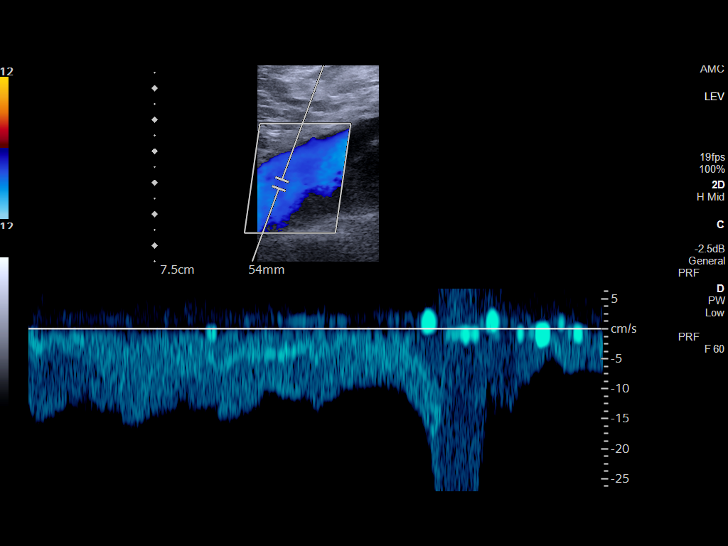
[im 5/28]
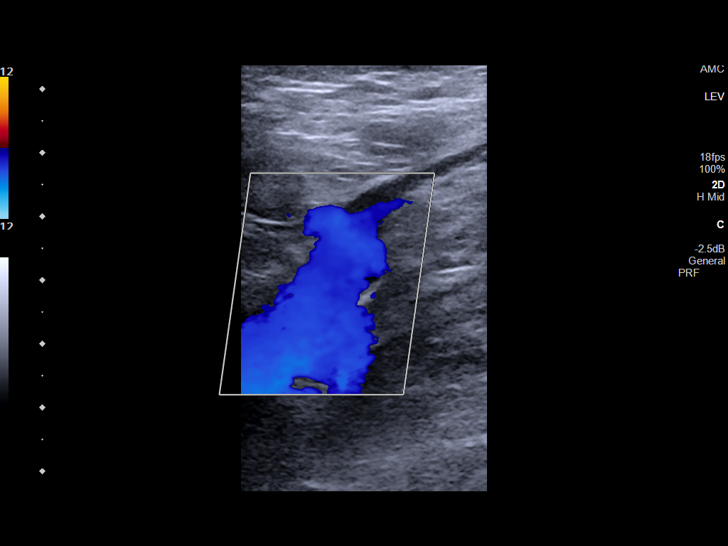
[im 8/28]
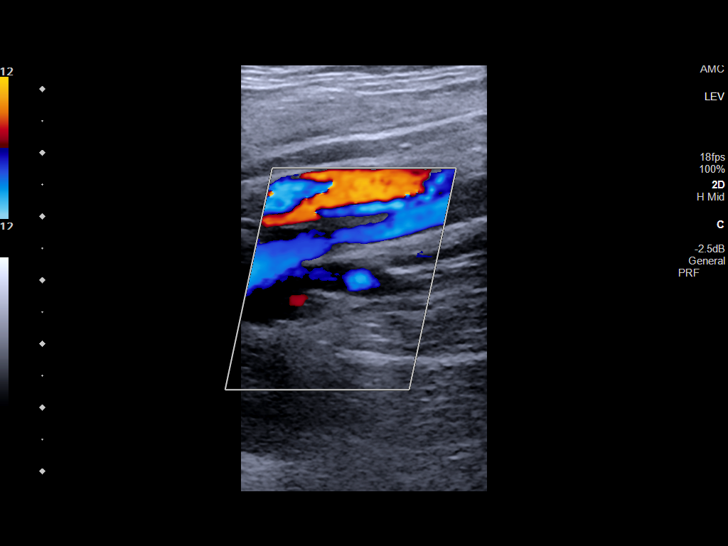
[im 9/28]
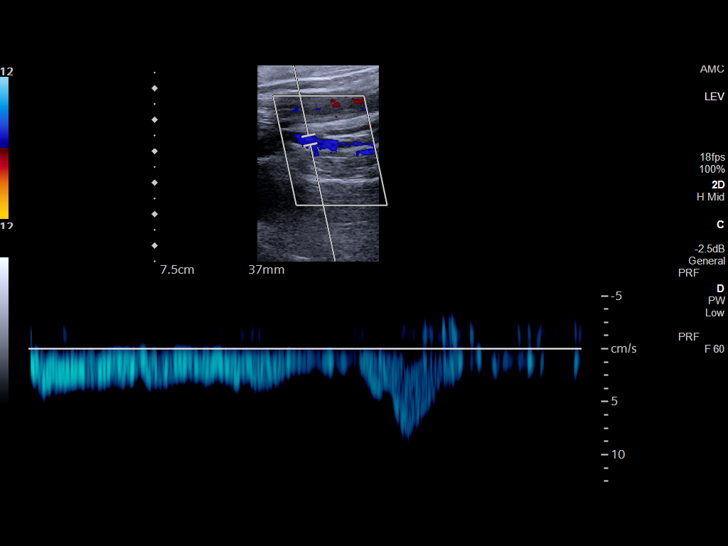
[im 11/28]
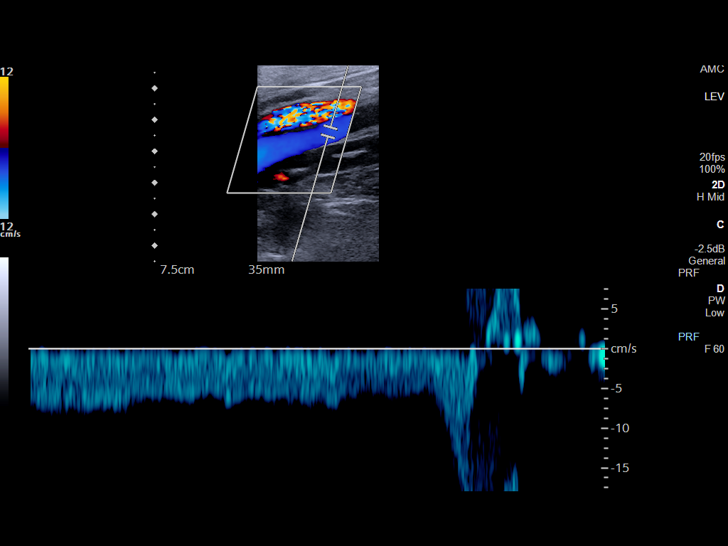
[im 13/28]
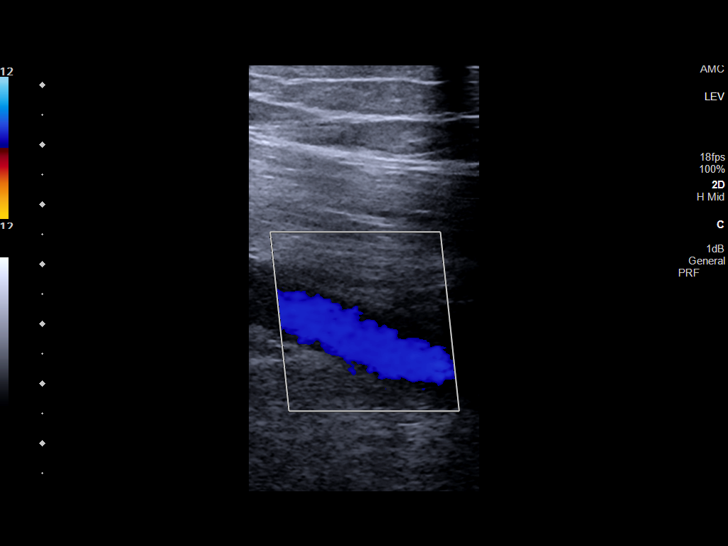
[im 15/28]
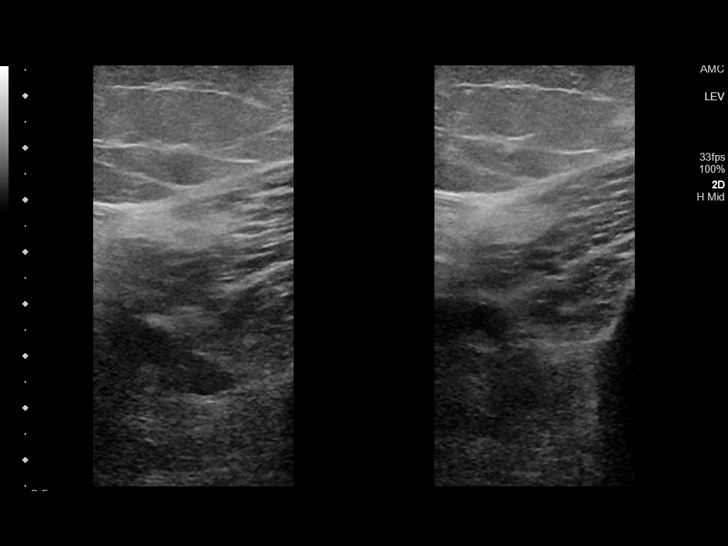
[im 17/28]
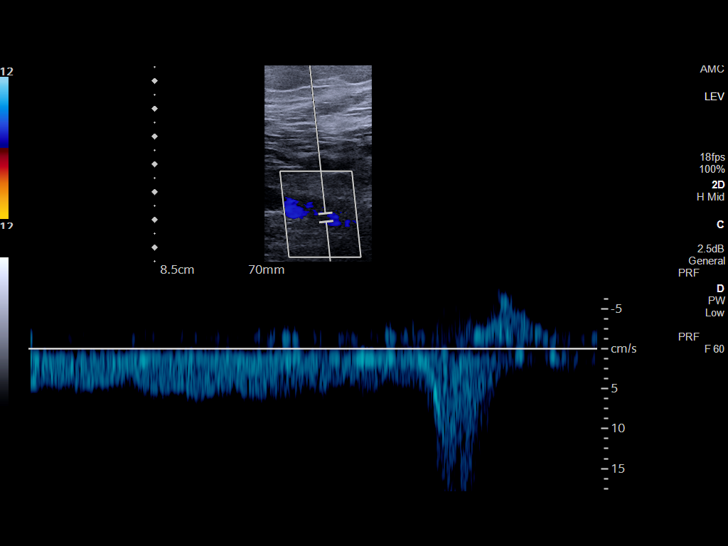
[im 19/28]
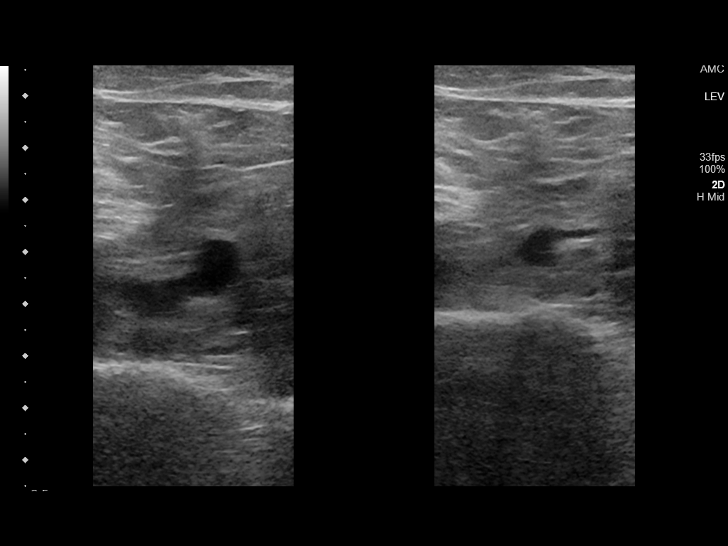
[im 22/28]
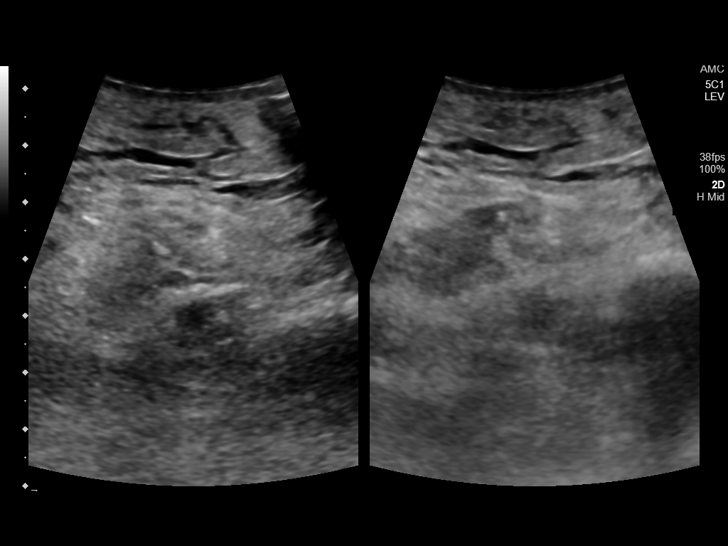
[im 23/28]
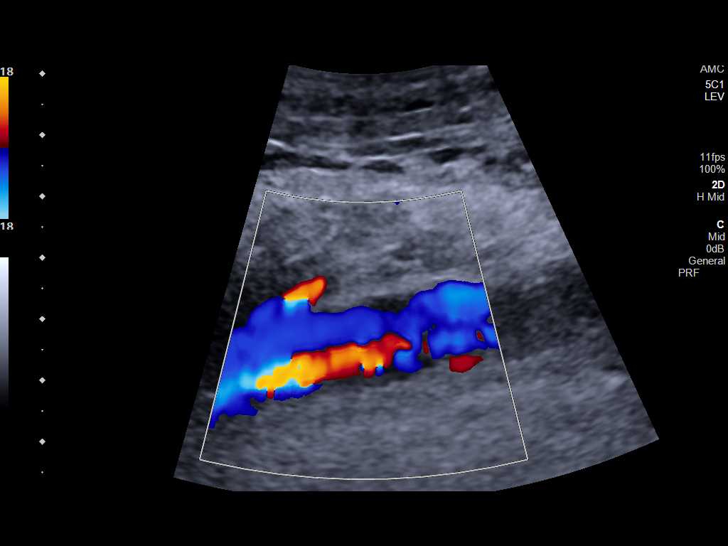
[im 25/28]
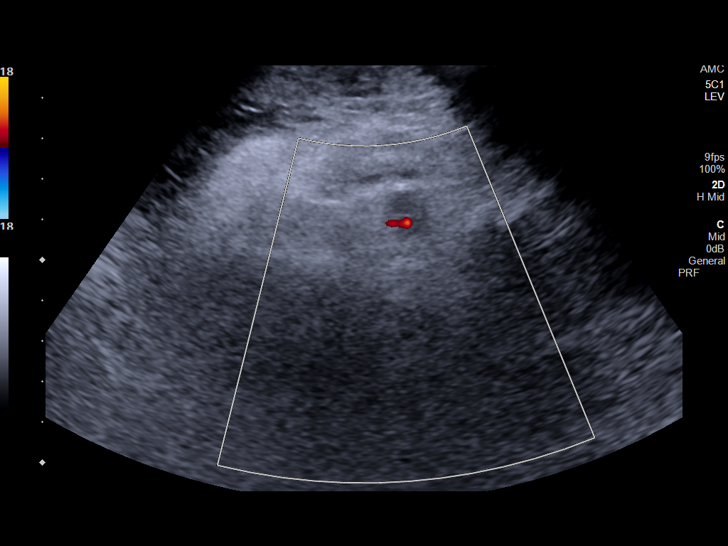
[im 28/28]
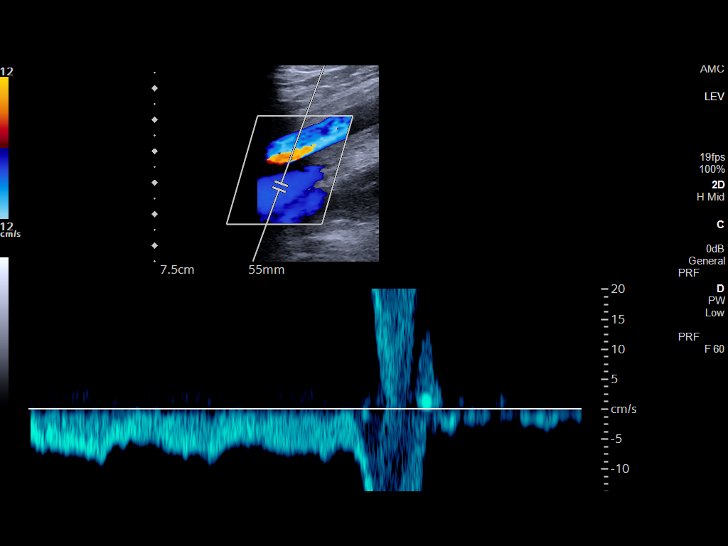

[14 of 24 positions shown; findings below may reference images not displayed]

FINDINGS: VENOUS

Normal compressibility of the common femoral, superficial femoral,
and popliteal veins, as well as the visualized calf veins.
Visualized portions of profunda femoral vein and great saphenous
vein unremarkable. No filling defects to suggest DVT on grayscale or
color Doppler imaging. Doppler waveforms show normal direction of
venous flow, normal respiratory plasticity and response to
augmentation.

Limited views of the contralateral common femoral vein are
unremarkable.

OTHER

None.

Limitations: none
IMPRESSION: Negative.

## 2021-09-25 ENCOUNTER — Encounter: Payer: Managed Care, Other (non HMO) | Admitting: Internal Medicine

## 2021-10-02 ENCOUNTER — Encounter: Payer: Self-pay | Admitting: Internal Medicine

## 2021-10-02 ENCOUNTER — Other Ambulatory Visit: Payer: Self-pay

## 2021-10-02 ENCOUNTER — Ambulatory Visit (INDEPENDENT_AMBULATORY_CARE_PROVIDER_SITE_OTHER): Payer: Managed Care, Other (non HMO) | Admitting: Internal Medicine

## 2021-10-02 VITALS — BP 140/92 | HR 82 | Temp 96.8°F | Ht 72.99 in | Wt 294.0 lb

## 2021-10-02 DIAGNOSIS — Z86010 Personal history of colonic polyps: Secondary | ICD-10-CM

## 2021-10-02 DIAGNOSIS — Z0000 Encounter for general adult medical examination without abnormal findings: Secondary | ICD-10-CM | POA: Diagnosis not present

## 2021-10-02 DIAGNOSIS — I890 Lymphedema, not elsewhere classified: Secondary | ICD-10-CM

## 2021-10-02 DIAGNOSIS — E1351 Other specified diabetes mellitus with diabetic peripheral angiopathy without gangrene: Secondary | ICD-10-CM

## 2021-10-02 DIAGNOSIS — E1165 Type 2 diabetes mellitus with hyperglycemia: Secondary | ICD-10-CM | POA: Diagnosis not present

## 2021-10-02 DIAGNOSIS — E7800 Pure hypercholesterolemia, unspecified: Secondary | ICD-10-CM | POA: Diagnosis not present

## 2021-10-02 DIAGNOSIS — I10 Essential (primary) hypertension: Secondary | ICD-10-CM | POA: Diagnosis not present

## 2021-10-02 DIAGNOSIS — I872 Venous insufficiency (chronic) (peripheral): Secondary | ICD-10-CM

## 2021-10-02 NOTE — Progress Notes (Signed)
[name], male   DOB: May 27, 1961, 60 y.o.   MRN: [id number]   Subjective:    [name], male    DOB: Sep 19, 1961, 60 y.o.   MRN: [id number]  This visit occurred during the SARS-CoV-2 public health emergency.  Safety protocols were in place, including screening questions prior to the visit, additional usage of staff PPE, and extensive cleaning of exam room while observing appropriate contact time as indicated for disinfecting solutions.   Patient here for her physical exam.  Chief Complaint  Patient presents with   Annual Exam   .   HPI States He is doing relatively well.  Discussed sugars.  Reviewed outside checks - % Time CGM active - 44%.  Target range 91% with 9% high readings.  Discussed low carb diet and exercise as tolerated.  On ozempic and metformin.  Converting to XR metformin.  GI symptoms appear to be relatively stable.  No chest pain.  Breathing stable.  Swelling improved. Bowels stable   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)    Pt states controlled by diet for last 4 years   Depression    Environmental allergies    Family history of colon cancer    GERD (gastroesophageal reflux disease)    History of chicken pox    Hyperlipidemia    Hypertension    Venous stasis    Vitamin D deficiency    Past Surgical History:  Procedure Laterality Date   CHOLECYSTECTOMY  2002   COLONOSCOPY WITH PROPOFOL N/A 06/27/2015   Procedure: COLONOSCOPY WITH PROPOFOL;  Surgeon: Lollie Sails, MD;  Location: [facility] ENDOSCOPY;  Service: Endoscopy;  Laterality: N/A;   COLONOSCOPY WITH PROPOFOL N/A 12/12/2020   Procedure: COLONOSCOPY WITH PROPOFOL;  Surgeon: Lesly Rubenstein, MD;  Location: [facility] ENDOSCOPY;  Service: Endoscopy;  Laterality: N/A;   HERNIA REPAIR  1962   Family History  Problem Relation Age of Onset   Stomach cancer Mother    Arthritis Mother    Diabetes Mother     Hypertension Mother    Hyperlipidemia Father    Heart disease Father    Diabetes Father    Hypertension Father    Arthritis Maternal Grandmother    Hyperlipidemia Maternal Grandmother    Hypertension Maternal Grandmother    Arthritis Maternal Grandfather    Hyperlipidemia Maternal Grandfather    Heart disease Maternal Grandfather    Hypertension Maternal Grandfather    Arthritis Paternal Grandmother    Breast cancer Paternal Grandmother    Hypertension Paternal Grandmother    Arthritis Paternal Grandfather    Hyperlipidemia Paternal Grandfather    Heart disease Paternal Grandfather    Diabetes Paternal Grandfather    Hypertension Paternal Grandfather    Diabetes Sister    Social History   Socioeconomic History   Marital status: Widowed    Spouse name: Not on file   Number of children: Not on file   Years of education: Not on file   Highest education level: Not on file  Occupational History   Not on file  Tobacco Use   Smoking status: Never   Smokeless tobacco: Never  Vaping Use   Vaping Use: Never used  Substance and Sexual Activity   Alcohol use: Yes    Alcohol/week: 0.0 - 1.0 standard drinks    Comment: 1 drink every 2 months   Drug use: No   Sexual activity: Not on file  Other Topics Concern   Not on file  Social History Narrative   Not on file   Social Determinants of Health   Financial Resource Strain: Low Risk    Difficulty of Paying Living Expenses: Not hard at all  Food Insecurity: Not on file  Transportation Needs: Not on file  Physical Activity: Not on file  Stress: Not on file  Social Connections: Not on file     Review of Systems  Constitutional:  Negative for appetite change and unexpected weight change.  HENT:  Negative for congestion, sinus pressure and sore throat.   Eyes:  Negative for pain and visual disturbance.  Respiratory:  Negative for cough, chest tightness, shortness of breath and wheezing.   Cardiovascular:  Negative for chest  pain and palpitations.       Swelling improved.   Gastrointestinal:  Negative for abdominal pain, diarrhea, nausea and vomiting.  Genitourinary:  Negative for difficulty urinating and dysuria.  Musculoskeletal:  Negative for joint swelling and myalgias.  Skin:  Negative for color change and rash.  Neurological:  Negative for dizziness, light-headedness and headaches.  Hematological:  Negative for adenopathy. Does not bruise/bleed easily.  Psychiatric/Behavioral:  Negative for agitation and dysphoric mood.       Objective:     BP (!) 140/92   Pulse 82   Temp (!) 96.8 F (36 C)   Ht 6' 0.99" (1.854 m)   Wt 294 lb (133.4 kg)   SpO2 96%   BMI 38.80 kg/m  Wt Readings from Last 3 Encounters:  10/02/21 294 lb (133.4 kg)  06/17/21 293 lb 9.6 oz (133.2 kg)  12/12/20 284 lb (128.8 kg)    Physical Exam Constitutional:      General: He is not in acute distress.    Appearance: Normal appearance. He is well-developed.  HENT:     Head: Normocephalic and atraumatic.     Right Ear: External ear normal.     Left Ear: External ear normal.  Eyes:     General: No scleral icterus.       Right eye: No discharge.        Left eye: No discharge.     Conjunctiva/sclera: Conjunctivae normal.  Neck:     Thyroid: No thyromegaly.  Cardiovascular:     Rate and Rhythm: Normal rate and regular rhythm.  Pulmonary:     Effort: No respiratory distress.     Breath sounds: Normal breath sounds. No wheezing.  Abdominal:     General: Bowel sounds are normal.     Palpations: Abdomen is soft.     Tenderness: There is no abdominal tenderness.  Musculoskeletal:        General: No tenderness.     Cervical back: Neck supple. No tenderness.     Comments: No increased swelling.    Lymphadenopathy:     Cervical: No cervical adenopathy.  Skin:    Findings: No erythema or rash.  Neurological:     Mental Status: He is alert and oriented to person, place, and time.  Psychiatric:        Mood and Affect:  Mood normal.        Behavior: Behavior normal.     Outpatient Encounter Medications as of 10/02/2021  Medication Sig   cholecalciferol (VITAMIN D) 1000 units tablet Take 1,000 Units by mouth daily.   Continuous Blood Gluc Sensor (FREESTYLE LIBRE 14 DAY SENSOR) MISC USE 1 DEVICE ONTO THE SKIN EVERY 14 DAYS   hydrochlorothiazide (HYDRODIURIL) 25 MG  tablet TAKE 1 TABLET(25 MG) BY MOUTH DAILY   lisinopril (ZESTRIL) 40 MG tablet TAKE 1 TABLET BY MOUTH  DAILY   loratadine (CLARITIN) 10 MG tablet Take 10 mg by mouth daily as needed for allergies.    metFORMIN (GLUCOPHAGE XR) 500 MG 24 hr tablet Take 2 tablets (1,000 mg total) by mouth daily.   rosuvastatin (CRESTOR) 10 MG tablet TAKE 1 TABLET BY MOUTH  DAILY   Semaglutide,0.25 or 0.5MG/DOS, (OZEMPIC, 0.25 OR 0.5 MG/DOSE,) 2 MG/1.5ML SOPN Inject 0.5 mg into the skin once a week.   No facility-administered encounter medications on file as of 10/02/2021.     Lab Results  Component Value Date   WBC 8.2 11/03/2020   HGB 14.2 11/03/2020   HCT 43.0 11/03/2020   PLT 285 11/03/2020   GLUCOSE 143 (H) 11/03/2020   CHOL 142 06/02/2021   TRIG 111 06/02/2021   HDL 40 06/02/2021   LDLCALC 82 06/02/2021   ALT 13 11/03/2020   AST 14 11/03/2020   NA 141 11/03/2020   K 3.7 11/03/2020   CL 97 11/03/2020   CREATININE 0.9 06/02/2021   BUN 15 11/03/2020   CO2 26 11/03/2020   TSH 1.560 07/08/2020   HGBA1C 6.5 06/02/2021       Assessment & Plan:   Problem List Items Addressed This Visit     Chronic venous insufficiency    Continue compression hose.  Continue leg elevation.  Swelling improved.  Skin - improved.  Follow.       Essential hypertension    Continue lisinopril and hctz.  Follow pressures.  Doing better.  Follow metabolic panel.       Relevant Orders   CBC with Differential/Platelet   TSH   Basic metabolic panel   Healthcare maintenance    Physical today 10/02/21.  Colonoscopy 11/2020 as outlined.  PSA 11/03/20 - .5.        History of colonic polyps    Colonoscopy 12/12/20 - polyp hepatic flexure, polyp - rectum and internal hemorrhoids. Recommended f/u in 7 years.        Hypercholesterolemia    On crestor.  Low cholesterol diet and exercise.  Follow lipid panel and liver function tests.        Relevant Orders   Hepatic function panel   Lipid panel   Lymphedema    Has seen [facility].  Continue compression hose. Legs improved.       Secondary diabetes with peripheral circulatory disorder (HCC)    Low carb diet and exercise.  On ozempic and metformin.  Sugars last check improved.  Swelling improved.  Changing to metformin XR.  May need to increase ozempic if sugars elevated and GI tolerance.        Type 2 diabetes mellitus with hyperglycemia (HCC)    On ozempic and metformin.  Sugars have improved.  Discussed continue diet and exercise  - for elevated sugar.  Follow met b and a1c.  Changing to XR metformin.  May need to increase ozempic if sugars elevated and if GI tolerance.        Relevant Orders   Hemoglobin A1c   Other Visit Diagnoses     Routine general medical examination at a health care facility    -  Primary        [name], MD

## 2021-10-11 ENCOUNTER — Encounter: Payer: Self-pay | Admitting: Internal Medicine

## 2021-10-11 DIAGNOSIS — Z0000 Encounter for general adult medical examination without abnormal findings: Secondary | ICD-10-CM | POA: Insufficient documentation

## 2021-10-11 NOTE — Assessment & Plan Note (Signed)
On crestor.  Low cholesterol diet and exercise.  Follow lipid panel and liver function tests.   

## 2021-10-11 NOTE — Assessment & Plan Note (Addendum)
Physical today 10/02/21.  Colonoscopy 11/2020 as outlined.  PSA 11/03/20 - .5.

## 2021-10-11 NOTE — Assessment & Plan Note (Signed)
Continue compression hose.  Continue leg elevation.  Swelling improved.  Skin - improved.  Follow.  

## 2021-10-11 NOTE — Assessment & Plan Note (Signed)
Has seen [facility].  Continue compression hose. Legs improved.

## 2021-10-11 NOTE — Assessment & Plan Note (Signed)
Continue lisinopril and hctz.  Follow pressures.  Doing better.  Follow metabolic panel.

## 2021-10-11 NOTE — Assessment & Plan Note (Signed)
On ozempic and metformin.  Sugars have improved.  Discussed continue diet and exercise  - for elevated sugar.  Follow met b and a1c.  Changing to XR metformin.  May need to increase ozempic if sugars elevated and if GI tolerance.

## 2021-10-11 NOTE — Assessment & Plan Note (Signed)
Low carb diet and exercise.  On ozempic and metformin.  Sugars last check improved.  Swelling improved.  Changing to metformin XR.  May need to increase ozempic if sugars elevated and GI tolerance.

## 2021-10-11 NOTE — Assessment & Plan Note (Addendum)
Colonoscopy 12/12/20 - polyp hepatic flexure, polyp - rectum and internal hemorrhoids. Recommended f/u in 7 years.   

## 2021-10-12 ENCOUNTER — Other Ambulatory Visit: Payer: Self-pay

## 2021-10-12 ENCOUNTER — Encounter: Payer: Self-pay | Admitting: Podiatry

## 2021-10-12 ENCOUNTER — Ambulatory Visit: Payer: Managed Care, Other (non HMO) | Admitting: Podiatry

## 2021-10-12 DIAGNOSIS — I890 Lymphedema, not elsewhere classified: Secondary | ICD-10-CM

## 2021-10-12 DIAGNOSIS — I878 Other specified disorders of veins: Secondary | ICD-10-CM

## 2021-10-12 DIAGNOSIS — E119 Type 2 diabetes mellitus without complications: Secondary | ICD-10-CM

## 2021-10-12 DIAGNOSIS — M79674 Pain in right toe(s): Secondary | ICD-10-CM

## 2021-10-12 DIAGNOSIS — B351 Tinea unguium: Secondary | ICD-10-CM

## 2021-10-12 DIAGNOSIS — M79675 Pain in left toe(s): Secondary | ICD-10-CM

## 2021-10-12 DIAGNOSIS — I872 Venous insufficiency (chronic) (peripheral): Secondary | ICD-10-CM

## 2021-10-12 NOTE — Progress Notes (Signed)
This patient returns to my office for at risk foot care.  This patient requires this care by a professional since this patient will be at risk due to having diabetes with circulatory disorder.   This patient is unable to cut nails himself since the patient cannot reach his nails.These nails are painful walking and wearing shoes.  This patient presents for at risk foot care today.  General Appearance  Alert, conversant and in no acute stress.  Vascular  Dorsalis pedis and posterior tibial  pulses are weakly/absent  palpable  bilaterally.  Capillary return is within normal limits  bilaterally. Temperature is within normal limits  Bilaterally. Lymphedema left greater than right.  Venous stasis  B/L.  Neurologic  Senn-Weinstein monofilament wire test within normal limits  bilaterally. Muscle power within normal limits bilaterally.  Nails Thick disfigured discolored nails with subungual debris  from hallux to fifth toes bilaterally. No evidence of bacterial infection or drainage bilaterally.  Orthopedic  No limitations of motion  feet .  No crepitus or effusions noted.  No bony pathology or digital deformities noted.  Skin  normotropic skin with no porokeratosis noted bilaterally.  No signs of infections or ulcers noted.     Onychomycosis  Pain in right toes  Pain in left toes  Consent was obtained for treatment procedures.   Mechanical debridement of nails 1-5  bilaterally performed with a nail nipper.  Filed with dremel without incident.    Return office visit   3 months                   Told patient to return for periodic foot care and evaluation due to potential at risk complications.   Hyrum Shaneyfelt DPM  

## 2021-10-20 ENCOUNTER — Telehealth: Payer: Managed Care, Other (non HMO)

## 2021-10-30 ENCOUNTER — Telehealth: Payer: Self-pay | Admitting: Internal Medicine

## 2021-10-30 DIAGNOSIS — E1165 Type 2 diabetes mellitus with hyperglycemia: Secondary | ICD-10-CM

## 2021-10-30 MED ORDER — FREESTYLE LIBRE 3 SENSOR MISC: 1.0000 | 3 refills | Status: DC

## 2021-10-30 NOTE — Telephone Encounter (Signed)
Pt called in regards to his glucose monitor. Pt is currently using the 14 day freestyle libre and is currently out. Pt has few questions about the next meter he should have.

## 2021-10-30 NOTE — Telephone Encounter (Signed)
Pt called in stating that he wasn't sure if you wanted him to stay on the 14 days Libre or do you want Pt to try the other 14 day glucose monitor with the phone. Pt requesting callback to discuss options with you.

## 2021-10-30 NOTE — Telephone Encounter (Signed)
Spoke with patient. Sent script for [place] 3.

## 2021-11-03 ENCOUNTER — Encounter: Payer: Self-pay | Admitting: Internal Medicine

## 2021-11-03 NOTE — Telephone Encounter (Signed)
Please see attached.  FYI

## 2021-11-20 ENCOUNTER — Ambulatory Visit: Payer: Managed Care, Other (non HMO) | Admitting: Pharmacist

## 2021-11-20 DIAGNOSIS — E7800 Pure hypercholesterolemia, unspecified: Secondary | ICD-10-CM

## 2021-11-20 DIAGNOSIS — I10 Essential (primary) hypertension: Secondary | ICD-10-CM

## 2021-11-20 DIAGNOSIS — E1165 Type 2 diabetes mellitus with hyperglycemia: Secondary | ICD-10-CM

## 2021-11-20 MED ORDER — OZEMPIC (1 MG/DOSE) 4 MG/3ML ~~LOC~~ SOPN: 1.0000 mg | PEN_INJECTOR | SUBCUTANEOUS | 1 refills | Status: DC

## 2021-11-20 NOTE — Chronic Care Management (AMB) (Signed)
Chronic Care Management CCM Pharmacy Note  11/20/2021 Name:  [name] MRN:  [id number] DOB:  [birth year]  Summary: - Per CGM, glucose readings elevated  Recommendations/Changes made from today's visit: - Increase Ozempic to 1 mg weekly.  - If next LDL >70, recommend increasing rosuvastatin to 20 mg daily  Subjective: [name] is an 60 y.o. year old male who is a primary patient of [name], MD.  The CCM team was consulted for assistance with disease management and care coordination needs.    Engaged with patient by telephone for follow up visit for pharmacy case management and/or care coordination services.   Objective:  Medications Reviewed Today     Reviewed by De Hollingshead, RPH-CPP (Pharmacist) on 11/20/21 at 1530  Med List Status: <None>   Medication Order Taking? Sig Documenting Provider Last Dose Status Informant  cholecalciferol (VITAMIN D) 1000 units tablet [id number]  Take 1,000 Units by mouth daily. [provider]  Active Self  Continuous Blood Gluc Sensor (FREESTYLE LIBRE 3 SENSOR) [place] [id number] Yes Apply 1 each topically every 14 (fourteen) days. Place 1 sensor on the skin every 14 days. Use to check glucose continuously [name], MD Taking Active   hydrochlorothiazide (HYDRODIURIL) 25 MG tablet [id number] Yes TAKE 1 TABLET(25 MG) BY MOUTH DAILY [name], MD Taking Active   lisinopril (ZESTRIL) 40 MG tablet [id number] Yes TAKE 1 TABLET BY MOUTH  DAILY [name], MD Taking Active   loratadine (CLARITIN) 10 MG tablet [id number] Yes Take 10 mg by mouth daily as needed for allergies.  [provider] Taking Active   metFORMIN (GLUCOPHAGE XR) 500 MG 24 hr tablet [id number] Yes Take 2 tablets (1,000 mg total) by mouth daily. [name], MD Taking Active   rosuvastatin (CRESTOR) 10 MG tablet [id number] Yes TAKE 1 TABLET BY MOUTH  DAILY [name], MD Taking Active   Semaglutide,0.25 or 0.5MG /DOS, (OZEMPIC,  0.25 OR 0.5 MG/DOSE,) 2 MG/1.5ML SOPN [id number] Yes Inject 0.5 mg into the skin once a week. [name], MD Taking Active             Pertinent Labs:   Lab Results  Component Value Date   HGBA1C 6.5 06/02/2021   Lab Results  Component Value Date   CHOL 142 06/02/2021   HDL 40 06/02/2021   LDLCALC 82 06/02/2021   TRIG 111 06/02/2021   CHOLHDL 3.8 11/03/2020   Lab Results  Component Value Date   CREATININE 0.9 06/02/2021   BUN 15 11/03/2020   NA 141 11/03/2020   K 3.7 11/03/2020   CL 97 11/03/2020   CO2 26 11/03/2020    SDOH:  (Social Determinants of Health) assessments and interventions performed:  SDOH Interventions    Flowsheet Row Most Recent Value  SDOH Interventions   Financial Strain Interventions Intervention Not Indicated       CCM Care Plan  Review of patient past medical history, allergies, medications, health status, including review of consultants reports, laboratory and other test data, was performed as part of comprehensive evaluation and provision of chronic care management services.   Care Plan : Medication Management  Updates made by De Hollingshead, RPH-CPP since 11/20/2021 12:00 AM     Problem: Diabetes, HLD, HTN      Long-Range Goal: Medication Monitoring   Start Date: 12/02/2020  Recent Progress: On track  Priority: High  Note:   Current Barriers:  Unable to achieve control of diabetes   Pharmacist Clinical Goal(s):  Over the  next 90 days, patient will achieve control of diabetes as evidenced by A1c, TIR  through collaboration with PharmD and provider.   Interventions: 1:1 collaboration with [name], MD regarding development and update of comprehensive plan of care as evidenced by provider attestation and co-signature Inter-disciplinary care team collaboration (see longitudinal plan of care) Comprehensive medication review performed; medication list updated in electronic medical record  Health Maintenance    Yearly diabetic eye exam: due - requested he have records sent to [place] Yearly diabetic foot exam: up to date Urine microalbumin: up to date Yearly influenza vaccination: up to date Td/Tdap vaccination: due - recommended to discuss with PCP Pneumonia vaccination: due - recommended to discuss with PCP COVID vaccinations: due - recommended to pursue  Shingrix vaccinations: due - recommended to discuss with PCP Colonoscopy: up to date  Diabetes: Controlled per last A1c, but uncontrolled per CGM readings; current treatment: metformin XR 1000 mg daily, Ozempic 0.5 mg weekly  Current glucose readings: Date of Download: Libre 3 % Time CGM is active: 89% (<70% limits accurate interpretation) Average Glucose: 158 mg/dL Glucose Management Indicator: 7.1 Glucose Variability: 17.3% (goal <36%) Time in Goal:  - Time in range 70-180: 79% - Time above range: 21% - Time below range: 0% Discussed increasing Ozempic to target improvement in weight, blood sugars. Patient amenable. Increase Ozempic to 1 mg weekly. Discussed increased risk of GI side effects.   Hypertension: Controlled per last office reading; current treatment: lisinopril 40 mg daily, HCTZ 25 mg QPM (denies nocturia concerns w/ taking HCTZ QPM) Previously recommended to continue current regimen at this time.  Hyperlipidemia: At goal <100 but consider goal <70 given risk factors; current treatment: rosuvastatin 10 mg daily   Recommend to increase to rosuvastatin 20 mg daily. Patient plans to go get updated fasting labs soon, will follow those results   Supplements: Vitamin D 1000 units daily  Patient Goals/Self-Care Activities Over the next 90 days, patient will:  - check glucose three times daily using CGM, document, and provide at future appointments target a minimum of 150 minutes of moderate intensity exercise weekly engage in dietary modifications by continuing to focus on reduced portion sizes      Plan: Video visit  scheduled for 6 weeks  [name], PharmD, [place], CPP Clinical Pharmacist [employer] [facility] [employer] [phone]

## 2021-11-20 NOTE — Patient Instructions (Addendum)
Harrie Jeans,   Keep up the great work!  Increase Ozempic to 1 mg weekly. Continue metformin XR 1000 mg daily.   Please have your eye doctor send [place] the report from your most recent diabetic eye exam.  We recommend you get the updated bivalent COVID-19 booster, at least 2 months after any prior doses. You may consider delaying a booster dose by 3 months from a prior episode of COVID-19 per the CDC.   You can find pharmacies that have this formulation in stock at [email or web address]   Moving forward, we also recommend the following vaccinations. You can get these at our office: 1) Tdap (tetanus, diphtheria, pertussis)- recommended every 10 years 2) Shingrix (shingles) - recommended for everyone over the age of 78 3) Prevnar 48 (pneumonia) vaccination  Take care!  [name], PharmD

## 2021-12-23 ENCOUNTER — Other Ambulatory Visit: Payer: Self-pay | Admitting: Internal Medicine

## 2021-12-31 IMAGING — DX DG FOOT 2V*R*
2 series · 2 of 2 positions shown · non-contrast
Comparison: None.

CLINICAL DATA: Pain of the fifth toe

EXAM:
RIGHT FOOT - 2 VIEW

[foot ap]
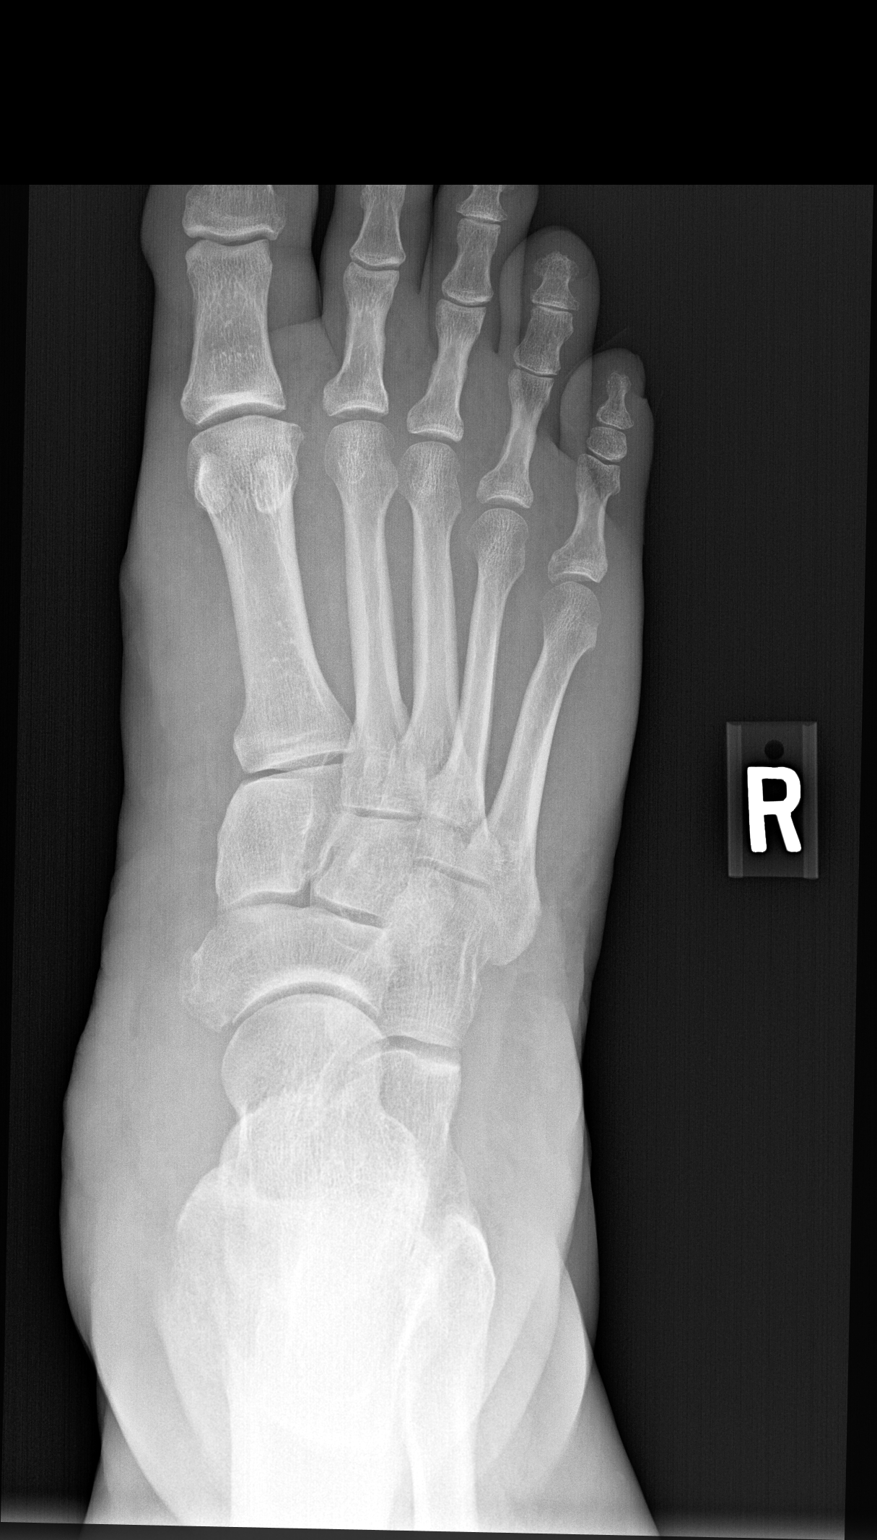

[foot lat]
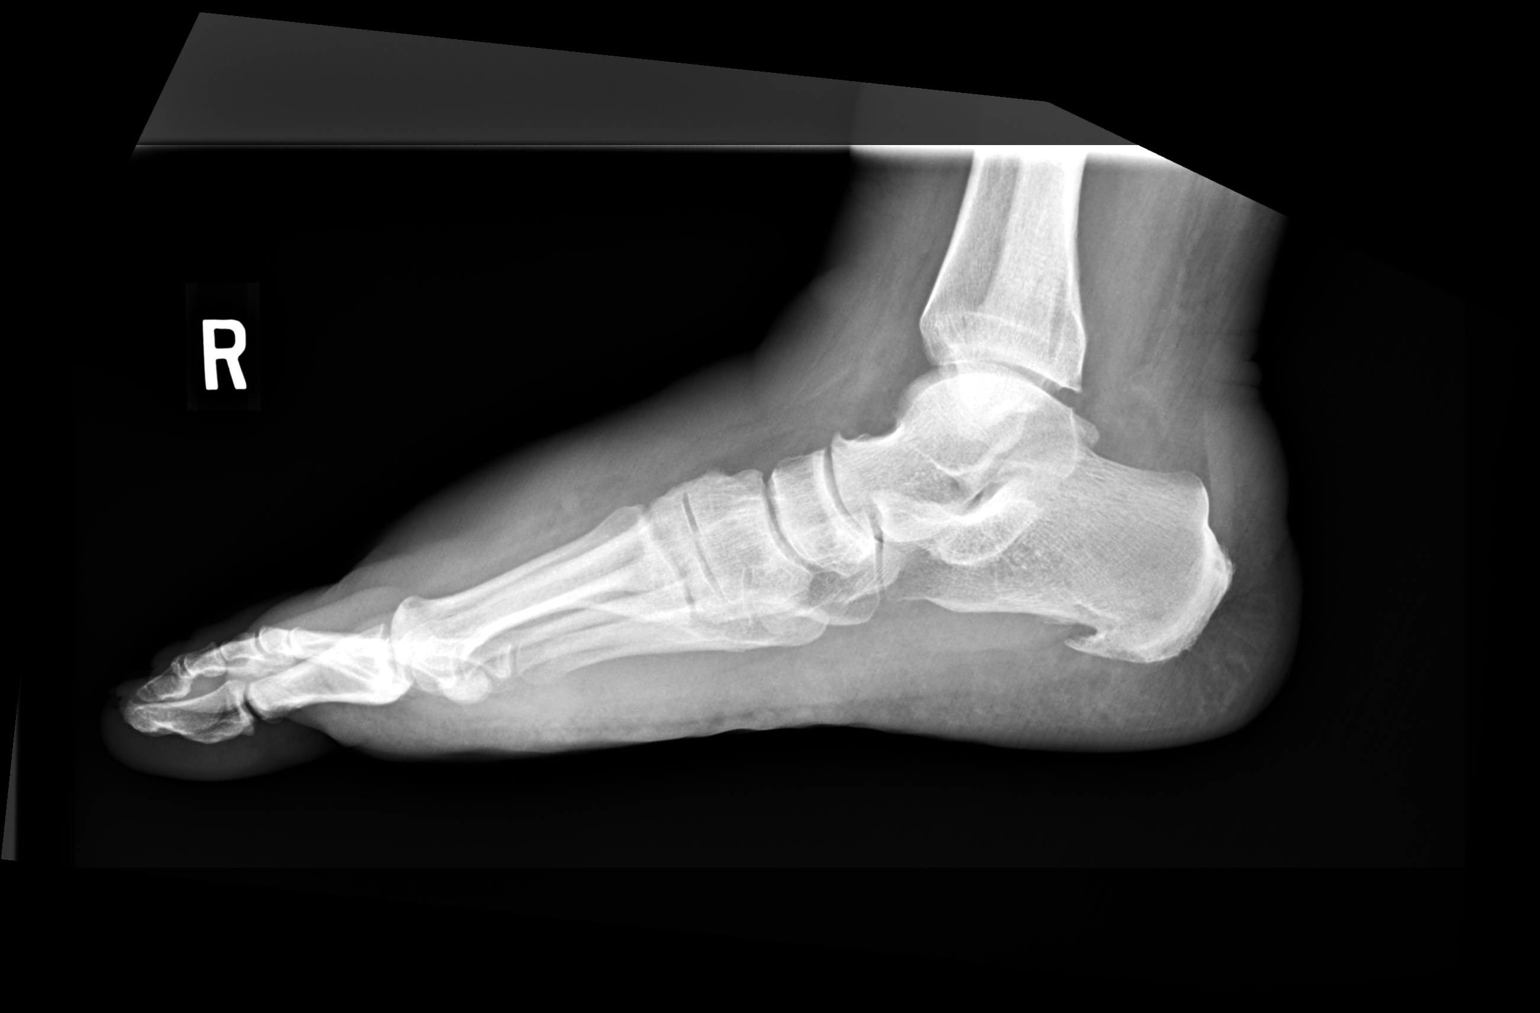

[2 of 2 positions shown; findings below may reference images not displayed]

FINDINGS: No acute fracture or dislocation. Mild midfoot degenerative changes.
No area of erosion or osseous destruction. No unexpected radiopaque
foreign body. Soft tissues are unremarkable.
IMPRESSION: No acute fracture or dislocation.

## 2022-01-08 ENCOUNTER — Telehealth: Payer: Managed Care, Other (non HMO)

## 2022-01-08 LAB — LIPID PANEL

## 2022-01-09 LAB — BASIC METABOLIC PANEL
BUN/Creatinine Ratio: 17 (ref 10–24)
BUN: 17 mg/dL (ref 8–27)
CO2: 28 mmol/L (ref 20–29)
Calcium: 9.3 mg/dL (ref 8.6–10.2)
Chloride: 99 mmol/L (ref 96–106)
Creatinine, Ser: 0.98 mg/dL (ref 0.76–1.27)
Glucose: 151 mg/dL — ABNORMAL HIGH (ref 70–99)
Potassium: 3.5 mmol/L (ref 3.5–5.2)
Sodium: 142 mmol/L (ref 134–144)
eGFR: 88 mL/min/{1.73_m2} (ref >59)

## 2022-01-09 LAB — CBC WITH DIFFERENTIAL/PLATELET
Basophils Absolute: 0.1 10*3/uL (ref 0.0–0.2)
Basos: 1 %
EOS (ABSOLUTE): 0.1 10*3/uL (ref 0.0–0.4)
Eos: 2 %
Hematocrit: 42.5 % (ref 37.5–51.0)
Hemoglobin: 14.3 g/dL (ref 13.0–17.7)
Immature Grans (Abs): 0 10*3/uL (ref 0.0–0.1)
Immature Granulocytes: 0 %
Lymphocytes Absolute: 1.9 10*3/uL (ref 0.7–3.1)
Lymphs: 25 %
MCH: 29.4 pg (ref 26.6–33.0)
MCHC: 33.6 g/dL (ref 31.5–35.7)
MCV: 87 fL (ref 79–97)
Monocytes Absolute: 0.6 10*3/uL (ref 0.1–0.9)
Monocytes: 9 %
Neutrophils Absolute: 4.7 10*3/uL (ref 1.4–7.0)
Neutrophils: 63 %
Platelets: 280 10*3/uL (ref 150–450)
RBC: 4.87 x10E6/uL (ref 4.14–5.80)
RDW: 12.7 % (ref 11.6–15.4)
WBC: 7.4 10*3/uL (ref 3.4–10.8)

## 2022-01-09 LAB — HEPATIC FUNCTION PANEL
ALT: 12 IU/L (ref 0–44)
AST: 13 IU/L (ref 0–40)
Albumin: 3.8 g/dL (ref 3.8–4.9)
Alkaline Phosphatase: 94 IU/L (ref 44–121)
Bilirubin Total: 0.3 mg/dL (ref 0.0–1.2)
Bilirubin, Direct: 0.11 mg/dL (ref 0.00–0.40)
Total Protein: 6.9 g/dL (ref 6.0–8.5)

## 2022-01-09 LAB — HEMOGLOBIN A1C
Est. average glucose Bld gHb Est-mCnc: 143 mg/dL
Hgb A1c MFr Bld: 6.6 % — ABNORMAL HIGH (ref 4.8–5.6)

## 2022-01-09 LAB — LIPID PANEL
Chol/HDL Ratio: 3.6 ratio (ref 0.0–5.0)
Cholesterol, Total: 132 mg/dL (ref 100–199)
HDL: 37 mg/dL — ABNORMAL LOW (ref >39)
LDL Chol Calc (NIH): 68 mg/dL (ref 0–99)
Triglycerides: 158 mg/dL — ABNORMAL HIGH (ref 0–149)
VLDL Cholesterol Cal: 27 mg/dL (ref 5–40)

## 2022-01-09 LAB — TSH: TSH: 1.69 u[IU]/mL (ref 0.450–4.500)

## 2022-01-11 ENCOUNTER — Telehealth (INDEPENDENT_AMBULATORY_CARE_PROVIDER_SITE_OTHER): Payer: Managed Care, Other (non HMO) | Admitting: Pharmacist

## 2022-01-11 DIAGNOSIS — E1165 Type 2 diabetes mellitus with hyperglycemia: Secondary | ICD-10-CM | POA: Diagnosis not present

## 2022-01-11 MED ORDER — OZEMPIC (1 MG/DOSE) 4 MG/3ML ~~LOC~~ SOPN: 1.0000 mg | PEN_INJECTOR | SUBCUTANEOUS | 1 refills | Status: DC

## 2022-01-11 NOTE — Progress Notes (Signed)
Chief Complaint  Patient presents with   Diabetes    [name] is a 61 y.o. year old male who was referred for medication management by their primary care provider, [name], MD. They presented for a virtual visit in the context of the COVID-19 pandemic.   I connected with [name] on 01/11/22 at 4 pm by video and verified that I am speaking with the correct person using two identifiers.   I discussed the limitations, risks, security and privacy concerns of performing an evaluation and management service by video and the availability of in person appointments. I also discussed with the patient that there may be a patient responsible charge related to this service. The patient expressed understanding and agreed to proceed.   Patient location:  home My Location:  clinic Persons on the video call:  myself and patient   Subjective: Diabetes:  Current medications: metformin XR 1000 mg twice daily, Ozempic 1 mg weekly   Current glucose readings: using Libre 3 CGM, reports his insurance prefers DexCom but he plans to see if they will authorize coverage for [place]  Date of Download: 2/7-2/20/23 % Time CGM is active: 94% Average Glucose: 143 mg/dL Glucose Management Indicator: 6.7  Glucose Variability: 14.6 (goal <36%) Time in Goal:  - Time in range 70-180: 95% - Time above range: 5% - Time below range: 0% Observed patterns: at goal   Current medication access support: none needed Hypertension:  Current medications: lisinopril 40 mg daily, HCTZ 25 mg daily  Current blood pressure readings readings: reports a reading at the eye doctor of 130/80, not checking at home and is thinking about getting a new cuff  Hyperlipidemia/ASCVD Risk Reduction  Current lipid lowering medications: rosuvastatin 10 mg daily  Objective: Lab Results  Component Value Date   HGBA1C 6.6 (H) 01/08/2022    Lab Results  Component Value Date   CREATININE 0.98 01/08/2022   BUN 17  01/08/2022   NA 142 01/08/2022   K 3.5 01/08/2022   CL 99 01/08/2022   CO2 28 01/08/2022    Lab Results  Component Value Date   CHOL 132 01/08/2022   HDL 37 (L) 01/08/2022   LDLCALC 68 01/08/2022   TRIG 158 (H) 01/08/2022   CHOLHDL 3.6 01/08/2022    Medications Reviewed Today     Reviewed by De Hollingshead, RPH-CPP (Pharmacist) on 01/11/22 at 1609  Med List Status: <None>   Medication Order Taking? Sig Documenting Provider Last Dose Status Informant  cholecalciferol (VITAMIN D) 1000 units tablet [id number]  Take 1,000 Units by mouth daily. [provider]  Active Self  Continuous Blood Gluc Sensor (FREESTYLE LIBRE 3 SENSOR) [place] [id number] Yes Apply 1 each topically every 14 (fourteen) days. Place 1 sensor on the skin every 14 days. Use to check glucose continuously [name], MD Taking Active   hydrochlorothiazide (HYDRODIURIL) 25 MG tablet [id number]  TAKE 1 TABLET(25 MG) BY MOUTH DAILY [name], MD  Active   lisinopril (ZESTRIL) 40 MG tablet [id number]  TAKE 1 TABLET BY MOUTH  DAILY [name], MD  Active   loratadine (CLARITIN) 10 MG tablet [id number]  Take 10 mg by mouth daily as needed for allergies.  [provider]  Active   metFORMIN (GLUCOPHAGE XR) 500 MG 24 hr tablet [id number]  Take 2 tablets (1,000 mg total) by mouth daily. [name], MD  Active   rosuvastatin (CRESTOR) 10 MG tablet [id number]  TAKE 1 TABLET BY MOUTH  DAILY [name], MD  Active   Semaglutide, 1 MG/DOSE, (OZEMPIC, 1 MG/DOSE,) 4 MG/3ML SOPN [id number] Yes Inject 1 mg into the skin once a week. [name], MD Taking Active             Assessment/Plan:   Diabetes: - Currently controlled - Praised for maintenance of goal A1c - Recommend to recommended to continue current regimen at this time - Recommend to check glucose using CGM. He will let us know if his insurance prefers DexCom G7 and I can send that order.   Hypertension: - Currently  controlled - Reviewed long term cardiovascular and renal outcomes of uncontrolled blood pressure - Reviewed appropriate blood pressure monitoring technique and reviewed goal blood pressure. Recommended to check home blood pressure and heart rate periodically. He will consider purchasing a home BP cuff - Recommend to continue current regimen at this time   Hyperlipidemia/ASCVD Risk Reduction: - Currently controlled.  - Reviewed long term complications of uncontrolled cholesterol - Recommend to continue current regimen at this time    Health Maintenance - Discussed CDC/ACIP recommendations for Shingrix, COVID booster vaccine(s). Recommended to consider COVID booster at pharmacy, Shingrix with next PCP appointment   Follow Up Plan: video visit in 6 months  [name], PharmD, [place], [place] Pharmacist [employer] [facility] [employer] [facility] [phone]

## 2022-01-11 NOTE — Patient Instructions (Signed)
[name],   It was great talking to you today! Congratulations on your great lab work.   Let me know if I need to send the prescription for the DexCom G7.   Please have your eye doctor send the results of your most recent diabetic eye exam to our office.   We recommend you get the updated bivalent COVID-19 booster, at least 2 months after any prior doses. You may consider delaying a booster dose by 3 months from a prior episode of COVID-19 per the CDC. You can pursue this without a prescription at your local pharmacy, or feel free to call our [place] at [facility] at [phone].  When you see Dr. Nicki Reaper next, talk to her about the Shingrix vaccine.   Take care!  [name], PharmD

## 2022-01-14 ENCOUNTER — Encounter: Payer: Self-pay | Admitting: Podiatry

## 2022-01-14 ENCOUNTER — Other Ambulatory Visit: Payer: Self-pay

## 2022-01-14 ENCOUNTER — Ambulatory Visit: Payer: Managed Care, Other (non HMO) | Admitting: Podiatry

## 2022-01-14 DIAGNOSIS — E119 Type 2 diabetes mellitus without complications: Secondary | ICD-10-CM | POA: Diagnosis not present

## 2022-01-14 DIAGNOSIS — I878 Other specified disorders of veins: Secondary | ICD-10-CM | POA: Diagnosis not present

## 2022-01-14 DIAGNOSIS — I890 Lymphedema, not elsewhere classified: Secondary | ICD-10-CM | POA: Diagnosis not present

## 2022-01-14 DIAGNOSIS — B351 Tinea unguium: Secondary | ICD-10-CM

## 2022-01-14 DIAGNOSIS — M79674 Pain in right toe(s): Secondary | ICD-10-CM

## 2022-01-14 DIAGNOSIS — M79675 Pain in left toe(s): Secondary | ICD-10-CM

## 2022-01-14 NOTE — Progress Notes (Signed)
This patient returns to my office for at risk foot care.  This patient requires this care by a professional since this patient will be at risk due to having diabetes with circulatory disorder.   This patient is unable to cut nails himself since the patient cannot reach his nails.These nails are painful walking and wearing shoes.  This patient presents for at risk foot care today.  General Appearance  Alert, conversant and in no acute stress.  Vascular  Dorsalis pedis and posterior tibial  pulses are weakly/absent  palpable  bilaterally.  Capillary return is within normal limits  bilaterally. Temperature is within normal limits  Bilaterally. Lymphedema left greater than right.  Venous stasis  B/L.  Neurologic  Senn-Weinstein monofilament wire test within normal limits  bilaterally. Muscle power within normal limits bilaterally.  Nails Thick disfigured discolored nails with subungual debris  from hallux to fifth toes bilaterally. No evidence of bacterial infection or drainage bilaterally.  Orthopedic  No limitations of motion  feet .  No crepitus or effusions noted.  No bony pathology or digital deformities noted.  Skin  normotropic skin with no porokeratosis noted bilaterally.  No signs of infections or ulcers noted.     Onychomycosis  Pain in right toes  Pain in left toes  Consent was obtained for treatment procedures.   Mechanical debridement of nails 1-5  bilaterally performed with a nail nipper.  Filed with dremel without incident.    Return office visit   3 months                   Told patient to return for periodic foot care and evaluation due to potential at risk complications.   Kagen Kunath DPM  

## 2022-01-22 ENCOUNTER — Telehealth: Payer: Self-pay | Admitting: Pharmacist

## 2022-01-22 NOTE — Telephone Encounter (Signed)
Per patient request, urgent PA submitted for Libre 3 CGM via phone call. PA reference  ?[id number] ?

## 2022-01-26 ENCOUNTER — Ambulatory Visit: Payer: Self-pay | Admitting: Pharmacist

## 2022-01-26 NOTE — Patient Instructions (Signed)
Hi Chuck,  ? ?Unfortunately, I am being asked to quickly transition into another role within the health system, so I am unable to keep our next appointment. Please continue to follow up with Dr. Nicki Reaper as scheduled.  ? ?It has been a pleasure working with you! ? ?[name], PharmD ? ?

## 2022-01-26 NOTE — Chronic Care Management (AMB) (Signed)
?  Chronic Care Management  ? ?Note ? ?01/26/2022 ?Name: [name] MRN: [id number] DOB: 11-Dec-1960 ? ? ? ?Closing pharmacy CCM case at this time. Patient has clinic contact information for future questions or concerns.  ? ?[name], PharmD, [place], CPP ?Clinical Pharmacist ?Therapist, music at [employer] ?[phone] ? ?

## 2022-01-28 ENCOUNTER — Encounter: Payer: Self-pay | Admitting: Internal Medicine

## 2022-01-29 ENCOUNTER — Telehealth: Payer: Self-pay | Admitting: Pharmacist

## 2022-01-29 DIAGNOSIS — E1165 Type 2 diabetes mellitus with hyperglycemia: Secondary | ICD-10-CM

## 2022-01-29 MED ORDER — DEXCOM G7 SENSOR MISC: 1.0000 | 11 refills | Status: DC

## 2022-01-29 NOTE — Telephone Encounter (Signed)
Patient called, reported that [place] PA was denied, his insurance prefers DexCom. Script for [place] sent to the pharmacy.  ?

## 2022-02-04 ENCOUNTER — Ambulatory Visit: Payer: Managed Care, Other (non HMO) | Admitting: Internal Medicine

## 2022-02-04 ENCOUNTER — Other Ambulatory Visit: Payer: Self-pay

## 2022-02-04 ENCOUNTER — Encounter: Payer: Self-pay | Admitting: Internal Medicine

## 2022-02-04 DIAGNOSIS — I872 Venous insufficiency (chronic) (peripheral): Secondary | ICD-10-CM

## 2022-02-04 DIAGNOSIS — E1351 Other specified diabetes mellitus with diabetic peripheral angiopathy without gangrene: Secondary | ICD-10-CM | POA: Diagnosis not present

## 2022-02-04 DIAGNOSIS — Z86010 Personal history of colonic polyps: Secondary | ICD-10-CM

## 2022-02-04 DIAGNOSIS — E1165 Type 2 diabetes mellitus with hyperglycemia: Secondary | ICD-10-CM | POA: Diagnosis not present

## 2022-02-04 DIAGNOSIS — I10 Essential (primary) hypertension: Secondary | ICD-10-CM

## 2022-02-04 DIAGNOSIS — I890 Lymphedema, not elsewhere classified: Secondary | ICD-10-CM

## 2022-02-04 DIAGNOSIS — E7800 Pure hypercholesterolemia, unspecified: Secondary | ICD-10-CM

## 2022-02-04 LAB — HM DIABETES FOOT EXAM

## 2022-02-04 NOTE — Progress Notes (Signed)
[name], male   DOB: 12-23-1960, 61 y.o.   MRN: [id number] ? ? ?Subjective:  ? ? [name], male    DOB: 1961-08-19, 61 y.o.   MRN: [id number] ? ?This visit occurred during the SARS-CoV-2 public health emergency.  Safety protocols were in place, including screening questions prior to the visit, additional usage of staff PPE, and extensive cleaning of exam room while observing appropriate contact time as indicated for disinfecting solutions.  ? ?Patient here for a scheduled follow up.  ? ?Chief Complaint  ?Patient presents with  ? Follow-up  ?  4 month follow up   ? .  ? ?HPI ?Here to follow up regarding his blood sugars,  lower extremity swelling and cholesterol.  He reports he is doing relatively well.  Feels better.  Is watching his sugar/diet intake.  Has lost weight.  Sugars doing better.  Has libre sensor.  % time active 95%.  87% target range.  13% high.  Discussed recent labs 6.6% a1c.  His insurance is changing - apparently not covering [place].  Had questions about this.  Trying to stay more active.  No chest pain or sob reported.  No cough or congestion.  No acid reflux or abdominal pain or bowel change reported.   ? ? ?Past Medical History:  ?Diagnosis Date  ? Controlled diabetes mellitus with peripheral circulatory disorder (HCC)   ? Pt states controlled by diet for last 4 years  ? Depression   ? Environmental allergies   ? Family history of colon cancer   ? GERD (gastroesophageal reflux disease)   ? History of chicken pox   ? Hyperlipidemia   ? Hypertension   ? Venous stasis   ? Vitamin D deficiency   ? ?Past Surgical History:  ?Procedure Laterality Date  ? CHOLECYSTECTOMY  2002  ? COLONOSCOPY WITH PROPOFOL N/A 06/27/2015  ? Procedure: COLONOSCOPY WITH PROPOFOL;  Surgeon: Lollie Sails, MD;  Location: [facility] ENDOSCOPY;  Service: Endoscopy;  Laterality: N/A;  ? COLONOSCOPY WITH PROPOFOL N/A 12/12/2020  ? Procedure: COLONOSCOPY WITH PROPOFOL;  Surgeon: Lesly Rubenstein,  MD;  Location: [facility] ENDOSCOPY;  Service: Endoscopy;  Laterality: N/A;  ? [place]  ? ?Family History  ?Problem Relation Age of Onset  ? Stomach cancer Mother   ? Arthritis Mother   ? Diabetes Mother   ? Hypertension Mother   ? Hyperlipidemia Father   ? Heart disease Father   ? Diabetes Father   ? Hypertension Father   ? Arthritis Maternal Grandmother   ? Hyperlipidemia Maternal Grandmother   ? Hypertension Maternal Grandmother   ? Arthritis Maternal Grandfather   ? Hyperlipidemia Maternal Grandfather   ? Heart disease Maternal Grandfather   ? Hypertension Maternal Grandfather   ? Arthritis Paternal Grandmother   ? Breast cancer Paternal Grandmother   ? Hypertension Paternal Grandmother   ? Arthritis Paternal Grandfather   ? Hyperlipidemia Paternal Grandfather   ? Heart disease Paternal Grandfather   ? Diabetes Paternal Grandfather   ? Hypertension Paternal Grandfather   ? Diabetes Sister   ? ?Social History  ? ?Socioeconomic History  ? Marital status: Widowed  ?  Spouse name: Not on file  ? Number of children: Not on file  ? Years of education: Not on file  ? Highest education level: Not on file  ?Occupational History  ? Not on file  ?Tobacco Use  ? Smoking status: Never  ? Smokeless tobacco: Never  ?  Vaping Use  ? Vaping Use: Never used  ?Substance and Sexual Activity  ? Alcohol use: Yes  ?  Alcohol/week: 0.0 - 1.0 standard drinks  ?  Comment: 1 drink every 2 months  ? Drug use: No  ? Sexual activity: Not on file  ?Other Topics Concern  ? Not on file  ?Social History Narrative  ? Not on file  ? ?Social Determinants of Health  ? ?Financial Resource Strain: Low Risk   ? Difficulty of Paying Living Expenses: Not hard at all  ?Food Insecurity: Not on file  ?Transportation Needs: Not on file  ?Physical Activity: Not on file  ?Stress: Not on file  ?Social Connections: Not on file  ? ? ? ?Review of Systems  ?Constitutional:  Negative for appetite change and unexpected weight change.  ?HENT:  Negative for  congestion and sinus pressure.   ?Respiratory:  Negative for cough, chest tightness and shortness of breath.   ?Cardiovascular:  Negative for chest pain and palpitations.  ?     Leg swelling improved.   ?Gastrointestinal:  Negative for abdominal pain, diarrhea, nausea and vomiting.  ?Genitourinary:  Negative for difficulty urinating and dysuria.  ?Musculoskeletal:  Negative for joint swelling and myalgias.  ?Skin:  Negative for color change and rash.  ?Neurological:  Negative for dizziness, light-headedness and headaches.  ?Psychiatric/Behavioral:  Negative for agitation and dysphoric mood.   ? ?   ?Objective:  ?  ? ?BP 132/84   Pulse 76   Temp 97.6 ?F (36.4 ?C) (Oral)   Ht 6' (1.829 m)   Wt 293 lb 6.4 oz (133.1 kg)   SpO2 97%   BMI 39.79 kg/m?  ?Wt Readings from Last 3 Encounters:  ?02/04/22 293 lb 6.4 oz (133.1 kg)  ?10/02/21 294 lb (133.4 kg)  ?06/17/21 293 lb 9.6 oz (133.2 kg)  ? ? ?Physical Exam ?Constitutional:   ?   General: He is not in acute distress. ?   Appearance: Normal appearance. He is well-developed.  ?HENT:  ?   Head: Normocephalic and atraumatic.  ?   Right Ear: External ear normal.  ?   Left Ear: External ear normal.  ?Eyes:  ?   General: No scleral icterus.    ?   Right eye: No discharge.     ?   Left eye: No discharge.  ?Cardiovascular:  ?   Rate and Rhythm: Normal rate and regular rhythm.  ?Pulmonary:  ?   Effort: Pulmonary effort is normal. No respiratory distress.  ?   Breath sounds: Normal breath sounds.  ?Abdominal:  ?   General: Bowel sounds are normal.  ?   Palpations: Abdomen is soft.  ?   Tenderness: There is no abdominal tenderness.  ?Musculoskeletal:     ?   General: No swelling or tenderness.  ?   Cervical back: Neck supple. No tenderness.  ?Lymphadenopathy:  ?   Cervical: No cervical adenopathy.  ?Skin: ?   Findings: No erythema or rash.  ?Neurological:  ?   Mental Status: He is alert.  ?Psychiatric:     ?   Mood and Affect: Mood normal.     ?   Behavior: Behavior normal.   ? ? ? ?Outpatient Encounter Medications as of 02/04/2022  ?Medication Sig  ? cholecalciferol (VITAMIN D) 1000 units tablet Take 1,000 Units by mouth daily.  ? Continuous Blood Gluc Sensor (DEXCOM G7 SENSOR) MISC Apply 1 each topically as directed.  ? Continuous Blood Gluc Sensor (FREESTYLE LIBRE 3 SENSOR)  MISC Apply 1 each topically every 14 (fourteen) days. Place 1 sensor on the skin every 14 days. Use to check glucose continuously  ? hydrochlorothiazide (HYDRODIURIL) 25 MG tablet TAKE 1 TABLET(25 MG) BY MOUTH DAILY  ? lisinopril (ZESTRIL) 40 MG tablet TAKE 1 TABLET BY MOUTH  DAILY  ? loratadine (CLARITIN) 10 MG tablet Take 10 mg by mouth daily as needed for allergies.   ? metFORMIN (GLUCOPHAGE XR) 500 MG 24 hr tablet Take 2 tablets (1,000 mg total) by mouth daily.  ? rosuvastatin (CRESTOR) 10 MG tablet TAKE 1 TABLET BY MOUTH  DAILY  ? Semaglutide, 1 MG/DOSE, (OZEMPIC, 1 MG/DOSE,) 4 MG/3ML SOPN Inject 1 mg into the skin once a week.  ? ?No facility-administered encounter medications on file as of 02/04/2022.  ?  ? ?Lab Results  ?Component Value Date  ? WBC 7.4 01/08/2022  ? HGB 14.3 01/08/2022  ? HCT 42.5 01/08/2022  ? PLT 280 01/08/2022  ? GLUCOSE 151 (H) 01/08/2022  ? CHOL 132 01/08/2022  ? TRIG 158 (H) 01/08/2022  ? HDL 37 (L) 01/08/2022  ? [place] 68 01/08/2022  ? ALT 12 01/08/2022  ? AST 13 01/08/2022  ? NA 142 01/08/2022  ? K 3.5 01/08/2022  ? CL 99 01/08/2022  ? CREATININE 0.98 01/08/2022  ? BUN 17 01/08/2022  ? CO2 28 01/08/2022  ? TSH 1.690 01/08/2022  ? HGBA1C 6.6 (H) 01/08/2022  ? ? ?   ?Assessment & Plan:  ? ?Problem List Items Addressed This Visit   ? ? Chronic venous insufficiency  ?  Continue compression hose.  Continue leg elevation.  Swelling improved.  Skin - improved.  Follow.  ?  ?  ? Essential hypertension  ?  Continue lisinopril and hctz.  Follow pressures.  Doing better.  Follow metabolic panel.  ?  ?  ? History of colonic polyps  ?  Colonoscopy 12/12/20 - polyp hepatic flexure, polyp - rectum  and internal hemorrhoids. Recommended f/u in 7 years.   ?  ?  ? Hypercholesterolemia  ?  On crestor.  Low cholesterol diet and exercise.  Follow lipid panel and liver function tests.   ?  ?  ? Lymphedema  ?  Compress

## 2022-02-07 ENCOUNTER — Encounter: Payer: Self-pay | Admitting: Internal Medicine

## 2022-02-07 NOTE — Assessment & Plan Note (Signed)
Compression hose.  Leg elevation.   ?

## 2022-02-07 NOTE — Assessment & Plan Note (Signed)
Continue compression hose.  Continue leg elevation.  Swelling improved.  Skin - improved.  Follow.  

## 2022-02-07 NOTE — Assessment & Plan Note (Signed)
Colonoscopy 12/12/20 - polyp hepatic flexure, polyp - rectum and internal hemorrhoids. Recommended f/u in 7 years.   

## 2022-02-07 NOTE — Assessment & Plan Note (Signed)
On crestor.  Low cholesterol diet and exercise.  Follow lipid panel and liver function tests.   

## 2022-02-07 NOTE — Assessment & Plan Note (Signed)
Continue lisinopril and hctz.  Follow pressures.  Doing better.  Follow metabolic panel.  ?

## 2022-02-07 NOTE — Assessment & Plan Note (Signed)
Low carb diet and exercise.  On ozempic and metformin.  Sugars improved.  Follow met b and a1c.  Lower extremity swelling improved.     

## 2022-02-07 NOTE — Assessment & Plan Note (Signed)
On ozempic and metformin.  Sugars have improved.  Discussed diet and exercise.  Has libre sensor.  Insurance issue.   ?

## 2022-02-11 ENCOUNTER — Encounter: Payer: Self-pay | Admitting: Internal Medicine

## 2022-02-15 ENCOUNTER — Other Ambulatory Visit: Payer: Self-pay

## 2022-02-15 DIAGNOSIS — E1165 Type 2 diabetes mellitus with hyperglycemia: Secondary | ICD-10-CM

## 2022-02-15 MED ORDER — METFORMIN HCL ER 500 MG PO TB24: 1000.0000 mg | ORAL_TABLET | Freq: Every day | ORAL | 3 refills | Status: DC

## 2022-03-08 ENCOUNTER — Other Ambulatory Visit: Payer: Self-pay

## 2022-03-08 ENCOUNTER — Encounter: Payer: Self-pay | Admitting: Internal Medicine

## 2022-03-08 DIAGNOSIS — E1165 Type 2 diabetes mellitus with hyperglycemia: Secondary | ICD-10-CM

## 2022-03-08 MED ORDER — OZEMPIC (1 MG/DOSE) 4 MG/3ML ~~LOC~~ SOPN: 1.0000 mg | PEN_INJECTOR | SUBCUTANEOUS | 1 refills | Status: DC

## 2022-04-15 ENCOUNTER — Encounter: Payer: Self-pay | Admitting: Internal Medicine

## 2022-04-15 ENCOUNTER — Ambulatory Visit: Payer: Managed Care, Other (non HMO) | Admitting: Internal Medicine

## 2022-04-15 VITALS — BP 138/82 | HR 73 | Temp 98.2°F | Resp 18 | Ht 70.0 in | Wt 290.8 lb

## 2022-04-15 DIAGNOSIS — D649 Anemia, unspecified: Secondary | ICD-10-CM

## 2022-04-15 DIAGNOSIS — E7800 Pure hypercholesterolemia, unspecified: Secondary | ICD-10-CM

## 2022-04-15 DIAGNOSIS — I872 Venous insufficiency (chronic) (peripheral): Secondary | ICD-10-CM | POA: Diagnosis not present

## 2022-04-15 DIAGNOSIS — I10 Essential (primary) hypertension: Secondary | ICD-10-CM

## 2022-04-15 DIAGNOSIS — Z86010 Personal history of colonic polyps: Secondary | ICD-10-CM

## 2022-04-15 DIAGNOSIS — R600 Localized edema: Secondary | ICD-10-CM

## 2022-04-15 DIAGNOSIS — E1351 Other specified diabetes mellitus with diabetic peripheral angiopathy without gangrene: Secondary | ICD-10-CM

## 2022-04-15 DIAGNOSIS — E1165 Type 2 diabetes mellitus with hyperglycemia: Secondary | ICD-10-CM

## 2022-04-15 DIAGNOSIS — I890 Lymphedema, not elsewhere classified: Secondary | ICD-10-CM

## 2022-04-15 NOTE — Progress Notes (Signed)
Patient ID: ELIZARDO CHILSON, male   DOB: Mar 27, 1961, 61 y.o.   MRN: [id number]   Subjective:    Patient ID: Vonzella Nipple, male    DOB: May 19, 1961, 61 y.o.   MRN: [id number]   Patient here for a scheduled follow up.   Chief Complaint  Patient presents with   Follow-up   Diabetes   .   HPI Here to follow up regarding his blood sugars, blood pressure and lower extremity swelling.  Overall doing well.  Feels better overall.  Watching diet.  Sugars overall better.  No chest pain.  Breathing stable.  No acid reflux or abdominal pain reported.  No bowel change.     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 ([place])    Pt states controlled by diet for last 4 years   Depression    Environmental allergies    Family history of colon cancer    GERD (gastroesophageal reflux disease)    History of chicken pox    Hyperlipidemia    Hypertension    Venous stasis    Vitamin D deficiency    Past Surgical History:  Procedure Laterality Date   CHOLECYSTECTOMY  2002   COLONOSCOPY WITH PROPOFOL N/A 06/27/2015   Procedure: COLONOSCOPY WITH PROPOFOL;  Surgeon: Lollie Sails, MD;  Location: [facility] ENDOSCOPY;  Service: Endoscopy;  Laterality: N/A;   COLONOSCOPY WITH PROPOFOL N/A 12/12/2020   Procedure: COLONOSCOPY WITH PROPOFOL;  Surgeon: Lesly Rubenstein, MD;  Location: [facility] ENDOSCOPY;  Service: Endoscopy;  Laterality: N/A;   HERNIA REPAIR  1962   Family History  Problem Relation Age of Onset   Stomach cancer Mother    Arthritis Mother    Diabetes Mother    Hypertension Mother    Hyperlipidemia Father    Heart disease Father    Diabetes Father    Hypertension Father    Arthritis Maternal Grandmother    Hyperlipidemia Maternal Grandmother    Hypertension Maternal Grandmother    Arthritis Maternal Grandfather    Hyperlipidemia Maternal Grandfather    Heart disease Maternal Grandfather    Hypertension Maternal Grandfather     Arthritis Paternal Grandmother    Breast cancer Paternal Grandmother    Hypertension Paternal Grandmother    Arthritis Paternal Grandfather    Hyperlipidemia Paternal Grandfather    Heart disease Paternal Grandfather    Diabetes Paternal Grandfather    Hypertension Paternal Grandfather    Diabetes Sister    Social History   Socioeconomic History   Marital status: Widowed    Spouse name: Not on file   Number of children: Not on file   Years of education: Not on file   Highest education level: Not on file  Occupational History   Not on file  Tobacco Use   Smoking status: Never   Smokeless tobacco: Never  Vaping Use   Vaping Use: Never used  Substance and Sexual Activity   Alcohol use: Yes    Alcohol/week: 0.0 - 1.0 standard drinks    Comment: 1 drink every 2 months   Drug use: No   Sexual activity: Not on file  Other Topics Concern   Not on file  Social History Narrative   Not on file   Social Determinants of Health   Financial Resource Strain: Low Risk    Difficulty of Paying Living Expenses: Not hard at all  Food Insecurity: Not on file  Transportation Needs: Not on file  Physical Activity:  Not on file  Stress: Not on file  Social Connections: Not on file     Review of Systems  Constitutional:  Negative for appetite change and unexpected weight change.  HENT:  Negative for congestion and sinus pressure.   Respiratory:  Negative for cough and chest tightness.        Breathing stable.   Cardiovascular:  Negative for chest pain and palpitations.       Swelling better.  Wears compression hose.   Gastrointestinal:  Negative for abdominal pain, diarrhea, nausea and vomiting.  Genitourinary:  Negative for difficulty urinating and dysuria.  Musculoskeletal:  Negative for joint swelling and myalgias.  Skin:  Negative for color change and rash.       Chronic stasis changes.   Neurological:  Negative for dizziness, light-headedness and headaches.   Psychiatric/Behavioral:  Negative for agitation and dysphoric mood.       Objective:     BP 138/82   Pulse 73   Temp 98.2 F (36.8 C) (Temporal)   Resp 18   Ht 5' 10"  (1.778 m)   Wt 290 lb 12.8 oz (131.9 kg)   SpO2 95%   BMI 41.73 kg/m  Wt Readings from Last 3 Encounters:  04/15/22 290 lb 12.8 oz (131.9 kg)  02/04/22 293 lb 6.4 oz (133.1 kg)  10/02/21 294 lb (133.4 kg)    Physical Exam Constitutional:      General: He is not in acute distress.    Appearance: Normal appearance. He is well-developed.  HENT:     Head: Normocephalic and atraumatic.     Right Ear: External ear normal.     Left Ear: External ear normal.  Eyes:     General: No scleral icterus.       Right eye: No discharge.        Left eye: No discharge.  Cardiovascular:     Rate and Rhythm: Normal rate and regular rhythm.  Pulmonary:     Effort: Pulmonary effort is normal. No respiratory distress.     Breath sounds: Normal breath sounds.  Abdominal:     General: Bowel sounds are normal.     Palpations: Abdomen is soft.     Tenderness: There is no abdominal tenderness.  Musculoskeletal:        General: No tenderness.     Cervical back: Neck supple. No tenderness.     Comments: No increased swelling.   Lymphadenopathy:     Cervical: No cervical adenopathy.  Skin:    Findings: No erythema.     Comments: Chronic stasis changes - lower extremities.   Neurological:     Mental Status: He is alert.  Psychiatric:        Mood and Affect: Mood normal.        Behavior: Behavior normal.     Outpatient Encounter Medications as of 04/15/2022  Medication Sig   cholecalciferol (VITAMIN D) 1000 units tablet Take 1,000 Units by mouth daily.   Continuous Blood Gluc Sensor (DEXCOM G7 SENSOR) MISC Apply 1 each topically as directed.   Continuous Blood Gluc Sensor (FREESTYLE LIBRE 3 SENSOR) MISC Apply 1 each topically every 14 (fourteen) days. Place 1 sensor on the skin every 14 days. Use to check glucose  continuously   hydrochlorothiazide (HYDRODIURIL) 25 MG tablet TAKE 1 TABLET(25 MG) BY MOUTH DAILY   lisinopril (ZESTRIL) 40 MG tablet TAKE 1 TABLET BY MOUTH  DAILY   loratadine (CLARITIN) 10 MG tablet Take 10 mg by mouth daily as  needed for allergies.    metFORMIN (GLUCOPHAGE XR) 500 MG 24 hr tablet Take 2 tablets (1,000 mg total) by mouth daily.   rosuvastatin (CRESTOR) 10 MG tablet TAKE 1 TABLET BY MOUTH  DAILY   Semaglutide, 1 MG/DOSE, (OZEMPIC, 1 MG/DOSE,) 4 MG/3ML SOPN Inject 1 mg into the skin once a week.   No facility-administered encounter medications on file as of 04/15/2022.     Lab Results  Component Value Date   WBC 7.4 01/08/2022   HGB 14.3 01/08/2022   HCT 42.5 01/08/2022   PLT 280 01/08/2022   GLUCOSE 151 (H) 01/08/2022   CHOL 132 01/08/2022   TRIG 158 (H) 01/08/2022   HDL 37 (L) 01/08/2022   LDLCALC 68 01/08/2022   ALT 12 01/08/2022   AST 13 01/08/2022   NA 142 01/08/2022   K 3.5 01/08/2022   CL 99 01/08/2022   CREATININE 0.98 01/08/2022   BUN 17 01/08/2022   CO2 28 01/08/2022   TSH 1.690 01/08/2022   HGBA1C 6.6 (H) 01/08/2022       Assessment & Plan:   Problem List Items Addressed This Visit     Anemia    Follow cbc.        Chronic venous insufficiency    Continue compression hose.  Continue leg elevation.  Swelling improved.  Skin - improved.  Follow.        Essential hypertension - Primary    Continue lisinopril and hctz.  Follow pressures. Recheck as outlined.  Follow metabolic panel.        Relevant Orders   Basic Metabolic Panel (BMET)   History of colonic polyps    Colonoscopy 12/12/20 - polyp hepatic flexure, polyp - rectum and internal hemorrhoids. Recommended f/u in 7 years.         Hypercholesterolemia    On crestor.  Low cholesterol diet and exercise.  Follow lipid panel and liver function tests.         Relevant Orders   Hepatic function panel   Lipid Profile   Lower extremity edema    Overall improved.         Lymphedema    Compression hose.  Leg elevation.         Secondary diabetes with peripheral circulatory disorder (HCC)    Low carb diet and exercise.  On ozempic and metformin.  Sugars improved.  Follow met b and a1c.  Lower extremity swelling improved.           Type 2 diabetes mellitus with hyperglycemia (HCC)    On ozempic and metformin.  Sugars have improved.  Discussed diet and exercise.  Follow met b and a1c.         Relevant Orders   HgB A1c   Urine Microalbumin w/creat. ratio     [name], MD

## 2022-04-25 ENCOUNTER — Encounter: Payer: Self-pay | Admitting: Internal Medicine

## 2022-04-25 NOTE — Assessment & Plan Note (Signed)
Follow cbc.  

## 2022-04-25 NOTE — Assessment & Plan Note (Signed)
Continue compression hose.  Continue leg elevation.  Swelling improved.  Skin - improved.  Follow.  

## 2022-04-25 NOTE — Assessment & Plan Note (Signed)
Overall improved.  

## 2022-04-25 NOTE — Assessment & Plan Note (Addendum)
Continue lisinopril and hctz.  Follow pressures. Recheck as outlined.  Follow metabolic panel.

## 2022-04-25 NOTE — Assessment & Plan Note (Signed)
On crestor.  Low cholesterol diet and exercise.  Follow lipid panel and liver function tests.   

## 2022-04-25 NOTE — Assessment & Plan Note (Signed)
Low carb diet and exercise.  On ozempic and metformin.  Sugars improved.  Follow met b and a1c.  Lower extremity swelling improved.

## 2022-04-25 NOTE — Assessment & Plan Note (Addendum)
On ozempic and metformin.  Sugars have improved.  Discussed diet and exercise.  Follow met b and a1c.

## 2022-04-25 NOTE — Assessment & Plan Note (Signed)
Colonoscopy 12/12/20 - polyp hepatic flexure, polyp - rectum and internal hemorrhoids. Recommended f/u in 7 years.   

## 2022-04-25 NOTE — Assessment & Plan Note (Signed)
Compression hose.  Leg elevation.

## 2022-04-26 ENCOUNTER — Encounter: Payer: Self-pay | Admitting: Podiatry

## 2022-04-26 ENCOUNTER — Ambulatory Visit: Payer: Managed Care, Other (non HMO) | Admitting: Podiatry

## 2022-04-26 DIAGNOSIS — E119 Type 2 diabetes mellitus without complications: Secondary | ICD-10-CM | POA: Diagnosis not present

## 2022-04-26 DIAGNOSIS — I878 Other specified disorders of veins: Secondary | ICD-10-CM

## 2022-04-26 DIAGNOSIS — M79674 Pain in right toe(s): Secondary | ICD-10-CM

## 2022-04-26 DIAGNOSIS — B351 Tinea unguium: Secondary | ICD-10-CM

## 2022-04-26 DIAGNOSIS — M79675 Pain in left toe(s): Secondary | ICD-10-CM

## 2022-04-26 DIAGNOSIS — I890 Lymphedema, not elsewhere classified: Secondary | ICD-10-CM

## 2022-04-26 NOTE — Progress Notes (Signed)
This patient returns to my office for at risk foot care.  This patient requires this care by a professional since this patient will be at risk due to having diabetes with circulatory disorder.   This patient is unable to cut nails himself since the patient cannot reach his nails.These nails are painful walking and wearing shoes.  This patient presents for at risk foot care today.  General Appearance  Alert, conversant and in no acute stress.  Vascular  Dorsalis pedis and posterior tibial  pulses are weakly/absent  palpable  bilaterally.  Capillary return is within normal limits  bilaterally. Temperature is within normal limits  Bilaterally. Lymphedema left greater than right.  Venous stasis  B/L.  Neurologic  Senn-Weinstein monofilament wire test within normal limits  bilaterally. Muscle power within normal limits bilaterally.  Nails Thick disfigured discolored nails with subungual debris  from hallux to fifth toes bilaterally. No evidence of bacterial infection or drainage bilaterally.  Orthopedic  No limitations of motion  feet .  No crepitus or effusions noted.  No bony pathology or digital deformities noted.  Skin  normotropic skin with no porokeratosis noted bilaterally.  No signs of infections or ulcers noted.     Onychomycosis  Pain in right toes  Pain in left toes  Consent was obtained for treatment procedures.   Mechanical debridement of nails 1-5  bilaterally performed with a nail nipper.  Filed with dremel without incident.    Return office visit   3 months                   Told patient to return for periodic foot care and evaluation due to potential at risk complications.   Blanche Scovell DPM  

## 2022-05-25 ENCOUNTER — Other Ambulatory Visit: Payer: Self-pay | Admitting: Internal Medicine

## 2022-05-27 ENCOUNTER — Telehealth: Payer: Managed Care, Other (non HMO)

## 2022-06-04 ENCOUNTER — Other Ambulatory Visit: Payer: Self-pay | Admitting: Internal Medicine

## 2022-06-17 LAB — LIPID PANEL
Cholesterol: 115 (ref 0–200)
HDL: 35 (ref 35–70)
LDL Cholesterol: 60
Triglycerides: 109 (ref 40–160)

## 2022-06-17 LAB — HEMOGLOBIN A1C: Hemoglobin A1C: 6.4

## 2022-06-17 LAB — BASIC METABOLIC PANEL
Creatinine: 0.8 (ref 0.6–1.3)
Glucose: 173

## 2022-06-17 LAB — COMPREHENSIVE METABOLIC PANEL: eGFR: 101

## 2022-06-28 ENCOUNTER — Encounter: Payer: Self-pay | Admitting: Internal Medicine

## 2022-06-29 ENCOUNTER — Telehealth: Payer: Self-pay

## 2022-06-29 DIAGNOSIS — Z125 Encounter for screening for malignant neoplasm of prostate: Secondary | ICD-10-CM

## 2022-06-29 NOTE — Telephone Encounter (Signed)
Pt request psa to be added to labs  Last done 2021 Added for draw at [facility]

## 2022-06-30 ENCOUNTER — Telehealth: Payer: Self-pay | Admitting: Internal Medicine

## 2022-06-30 NOTE — Telephone Encounter (Signed)
Received lab results from [facility] - overall sugar control looks good.  - a1c 6.4.  cholesterol levels are ok.  These are the only two labs I received.  See lab orders.  Not sure if any other labs checked.  Need to clarify.  Will need other labs if not drawn.  (Labs placed in box)

## 2022-07-01 NOTE — Telephone Encounter (Signed)
Labs sent to [place] were from pt wellness exam through [facility]. Pt is having rest of labs drawn prior to next appointment - 9/25. Orders already in.

## 2022-07-06 LAB — LIPID PANEL
Chol/HDL Ratio: 3.6 ratio (ref 0.0–5.0)
Cholesterol, Total: 130 mg/dL (ref 100–199)
HDL: 36 mg/dL — ABNORMAL LOW (ref >39)
LDL Chol Calc (NIH): 75 mg/dL (ref 0–99)
Triglycerides: 100 mg/dL (ref 0–149)
VLDL Cholesterol Cal: 19 mg/dL (ref 5–40)

## 2022-07-06 LAB — PSA: Prostate Specific Ag, Serum: 0.5 ng/mL (ref 0.0–4.0)

## 2022-07-06 LAB — BASIC METABOLIC PANEL
BUN/Creatinine Ratio: 23 (ref 10–24)
BUN: 19 mg/dL (ref 8–27)
CO2: 26 mmol/L (ref 20–29)
Calcium: 9.2 mg/dL (ref 8.6–10.2)
Chloride: 97 mmol/L (ref 96–106)
Creatinine, Ser: 0.81 mg/dL (ref 0.76–1.27)
Glucose: 171 mg/dL — ABNORMAL HIGH (ref 70–99)
Potassium: 3.3 mmol/L — ABNORMAL LOW (ref 3.5–5.2)
Sodium: 139 mmol/L (ref 134–144)
eGFR: 100 mL/min/{1.73_m2} (ref >59)

## 2022-07-06 LAB — MICROALBUMIN / CREATININE URINE RATIO
Creatinine, Urine: 60.7 mg/dL
Microalb/Creat Ratio: 12 mg/g creat (ref 0–29)
Microalbumin, Urine: 7.2 ug/mL

## 2022-07-06 LAB — HEMOGLOBIN A1C
Est. average glucose Bld gHb Est-mCnc: 143 mg/dL
Hgb A1c MFr Bld: 6.6 % — ABNORMAL HIGH (ref 4.8–5.6)

## 2022-07-06 LAB — HEPATIC FUNCTION PANEL
ALT: 11 IU/L (ref 0–44)
AST: 15 IU/L (ref 0–40)
Albumin: 4 g/dL (ref 3.9–4.9)
Alkaline Phosphatase: 99 IU/L (ref 44–121)
Bilirubin Total: 0.4 mg/dL (ref 0.0–1.2)
Bilirubin, Direct: 0.15 mg/dL (ref 0.00–0.40)
Total Protein: 6.6 g/dL (ref 6.0–8.5)

## 2022-07-07 ENCOUNTER — Other Ambulatory Visit: Payer: Self-pay

## 2022-07-07 DIAGNOSIS — I10 Essential (primary) hypertension: Secondary | ICD-10-CM

## 2022-07-07 NOTE — Progress Notes (Signed)
Bmet has been ordered through [facility] for recheck in 10 days.

## 2022-08-02 ENCOUNTER — Ambulatory Visit: Payer: Managed Care, Other (non HMO) | Admitting: Podiatry

## 2022-08-04 ENCOUNTER — Encounter: Payer: Self-pay | Admitting: Internal Medicine

## 2022-08-16 ENCOUNTER — Ambulatory Visit: Payer: Managed Care, Other (non HMO) | Admitting: Internal Medicine

## 2022-08-16 ENCOUNTER — Encounter: Payer: Self-pay | Admitting: Internal Medicine

## 2022-08-16 VITALS — BP 148/88 | HR 70 | Temp 97.5°F | Ht 70.0 in | Wt 283.2 lb

## 2022-08-16 DIAGNOSIS — E7800 Pure hypercholesterolemia, unspecified: Secondary | ICD-10-CM

## 2022-08-16 DIAGNOSIS — R600 Localized edema: Secondary | ICD-10-CM

## 2022-08-16 DIAGNOSIS — I872 Venous insufficiency (chronic) (peripheral): Secondary | ICD-10-CM

## 2022-08-16 DIAGNOSIS — E1165 Type 2 diabetes mellitus with hyperglycemia: Secondary | ICD-10-CM

## 2022-08-16 DIAGNOSIS — E1351 Other specified diabetes mellitus with diabetic peripheral angiopathy without gangrene: Secondary | ICD-10-CM

## 2022-08-16 DIAGNOSIS — Z23 Encounter for immunization: Secondary | ICD-10-CM | POA: Diagnosis not present

## 2022-08-16 DIAGNOSIS — E1151 Type 2 diabetes mellitus with diabetic peripheral angiopathy without gangrene: Secondary | ICD-10-CM | POA: Diagnosis not present

## 2022-08-16 DIAGNOSIS — I10 Essential (primary) hypertension: Secondary | ICD-10-CM

## 2022-08-16 DIAGNOSIS — Z86010 Personal history of colonic polyps: Secondary | ICD-10-CM

## 2022-08-16 MED ORDER — AMLODIPINE BESYLATE 5 MG PO TABS: 5.0000 mg | ORAL_TABLET | Freq: Every day | ORAL | 3 refills | Status: DC

## 2022-08-16 NOTE — Patient Instructions (Signed)
Start amlodipine '5mg'$  per day

## 2022-08-16 NOTE — Progress Notes (Signed)
[name], male   DOB: 1961/03/25, 61 y.o.   MRN: [id number]   Subjective:    [name], male    DOB: 02/07/1961, 61 y.o.   MRN: [id number]   Patient here for  Chief Complaint  Patient presents with   Follow-up    4 month follow up   .   HPI Here to follow up regarding his blood pressure and blood sugar.  Has done well with diet and weight loss.  States he feels better now than he has in a long time.  No chest pain reported.  No abdominal pain or bowel change reported.  Breathing stable.  States with last blood draw, noticed a shock in his arm.  Two hours - resolved.  Some change sensation - left arm/finger.  No neck pain.  Discussed further evaluation/work up - for example NCS, etc.  Will notify me if desires further w/up.     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 ([place])    Pt states controlled by diet for last 4 years   Depression    Environmental allergies    Family history of colon cancer    GERD (gastroesophageal reflux disease)    History of chicken pox    Hyperlipidemia    Hypertension    Venous stasis    Vitamin D deficiency    Past Surgical History:  Procedure Laterality Date   CHOLECYSTECTOMY  2002   COLONOSCOPY WITH PROPOFOL N/A 06/27/2015   Procedure: COLONOSCOPY WITH PROPOFOL;  Surgeon: Lollie Sails, MD;  Location: [facility] ENDOSCOPY;  Service: Endoscopy;  Laterality: N/A;   COLONOSCOPY WITH PROPOFOL N/A 12/12/2020   Procedure: COLONOSCOPY WITH PROPOFOL;  Surgeon: Lesly Rubenstein, MD;  Location: [facility] ENDOSCOPY;  Service: Endoscopy;  Laterality: N/A;   HERNIA REPAIR  1962   Family History  Problem Relation Age of Onset   Stomach cancer Mother    Arthritis Mother    Diabetes Mother    Hypertension Mother    Hyperlipidemia Father    Heart disease Father    Diabetes Father    Hypertension Father    Arthritis Maternal Grandmother    Hyperlipidemia  Maternal Grandmother    Hypertension Maternal Grandmother    Arthritis Maternal Grandfather    Hyperlipidemia Maternal Grandfather    Heart disease Maternal Grandfather    Hypertension Maternal Grandfather    Arthritis Paternal Grandmother    Breast cancer Paternal Grandmother    Hypertension Paternal Grandmother    Arthritis Paternal Grandfather    Hyperlipidemia Paternal Grandfather    Heart disease Paternal Grandfather    Diabetes Paternal Grandfather    Hypertension Paternal Grandfather    Diabetes Sister    Social History   Socioeconomic History   Marital status: Widowed    Spouse name: Not on file   Number of children: Not on file   Years of education: Not on file   Highest education level: Not on file  Occupational History   Not on file  Tobacco Use   Smoking status: Never   Smokeless tobacco: Never  Vaping Use   Vaping Use: Never used  Substance and Sexual Activity   Alcohol use: Yes    Alcohol/week: 0.0 - 1.0 standard drinks of alcohol    Comment: 1 drink every 2 months   Drug use: No   Sexual activity: Not on file  Other Topics Concern   Not on file  Social History Narrative   Not on file   Social Determinants of Health   Financial Resource Strain: Low Risk  (11/20/2021)   Overall Financial Resource Strain (CARDIA)    Difficulty of Paying Living Expenses: Not hard at all  Food Insecurity: Not on file  Transportation Needs: Not on file  Physical Activity: Inactive (05/29/2020)   Exercise Vital Sign    Days of Exercise per Week: 0 days    Minutes of Exercise per Session: 0 min  Stress: Not on file  Social Connections: Not on file     Review of Systems  Constitutional:  Negative for appetite change and unexpected weight change.  HENT:  Negative for congestion and sinus pressure.   Respiratory:  Negative for cough, chest tightness and shortness of breath.   Cardiovascular:  Negative for chest pain and palpitations.       Leg swelling - improved.    Gastrointestinal:  Negative for abdominal pain, nausea and vomiting.  Genitourinary:  Negative for difficulty urinating and dysuria.  Musculoskeletal:  Negative for joint swelling and myalgias.  Skin:  Negative for color change and rash.  Neurological:  Negative for dizziness, light-headedness and headaches.  Psychiatric/Behavioral:  Negative for agitation and dysphoric mood.        Objective:     BP (!) 148/88 (BP Location: Left Arm, Patient Position: Sitting, Cuff Size: Large)   Pulse 70   Temp (!) 97.5 F (36.4 C) (Oral)   Ht 5' 10"  (1.778 m)   Wt 283 lb 3.2 oz (128.5 kg)   SpO2 97%   BMI 40.63 kg/m  Wt Readings from Last 3 Encounters:  08/16/22 283 lb 3.2 oz (128.5 kg)  04/15/22 290 lb 12.8 oz (131.9 kg)  02/04/22 293 lb 6.4 oz (133.1 kg)    Physical Exam Constitutional:      General: He is not in acute distress.    Appearance: Normal appearance. He is well-developed.  HENT:     Head: Normocephalic and atraumatic.     Right Ear: External ear normal.     Left Ear: External ear normal.  Eyes:     General: No scleral icterus.       Right eye: No discharge.        Left eye: No discharge.  Cardiovascular:     Rate and Rhythm: Normal rate and regular rhythm.  Pulmonary:     Effort: Pulmonary effort is normal. No respiratory distress.     Breath sounds: Normal breath sounds.  Abdominal:     General: Bowel sounds are normal.     Palpations: Abdomen is soft.     Tenderness: There is no abdominal tenderness.  Musculoskeletal:        General: No tenderness.     Cervical back: Neck supple. No tenderness.     Comments: Swelling improved.   Lymphadenopathy:     Cervical: No cervical adenopathy.  Skin:    Findings: No erythema.     Comments: Stable stasis changes - lower extremities.   Neurological:     Mental Status: He is alert.  Psychiatric:        Mood and Affect: Mood normal.        Behavior: Behavior normal.      Outpatient Encounter Medications as of  08/16/2022  Medication Sig   amLODipine (NORVASC) 5 MG tablet Take 1 tablet (5 mg total) by mouth daily.   cholecalciferol (VITAMIN D) 1000 units tablet Take 1,000 Units by mouth daily.  Continuous Blood Gluc Sensor (DEXCOM G7 SENSOR) MISC Apply 1 each topically as directed.   Continuous Blood Gluc Sensor (FREESTYLE LIBRE 3 SENSOR) MISC Apply 1 each topically every 14 (fourteen) days. Place 1 sensor on the skin every 14 days. Use to check glucose continuously   hydrochlorothiazide (HYDRODIURIL) 25 MG tablet TAKE 1 TABLET(25 MG) BY MOUTH DAILY   lisinopril (ZESTRIL) 40 MG tablet TAKE 1 TABLET BY MOUTH  DAILY   loratadine (CLARITIN) 10 MG tablet Take 10 mg by mouth daily as needed for allergies.    metFORMIN (GLUCOPHAGE XR) 500 MG 24 hr tablet Take 2 tablets (1,000 mg total) by mouth daily.   rosuvastatin (CRESTOR) 10 MG tablet TAKE 1 TABLET BY MOUTH  DAILY   Semaglutide, 1 MG/DOSE, (OZEMPIC, 1 MG/DOSE,) 4 MG/3ML SOPN Inject 1 mg into the skin once a week.   No facility-administered encounter medications on file as of 08/16/2022.     Lab Results  Component Value Date   WBC 7.4 01/08/2022   HGB 14.3 01/08/2022   HCT 42.5 01/08/2022   PLT 280 01/08/2022   GLUCOSE 171 (H) 07/05/2022   CHOL 130 07/05/2022   TRIG 100 07/05/2022   HDL 36 (L) 07/05/2022   LDLCALC 75 07/05/2022   ALT 11 07/05/2022   AST 15 07/05/2022   NA 139 07/05/2022   K 3.3 (L) 07/05/2022   CL 97 07/05/2022   CREATININE 0.81 07/05/2022   BUN 19 07/05/2022   CO2 26 07/05/2022   TSH 1.690 01/08/2022   HGBA1C 6.6 (H) 07/05/2022       Assessment & Plan:   Problem List Items Addressed This Visit     Chronic venous insufficiency    Continue compression hose.  Continue leg elevation.  Swelling improved.  Skin - improved.  Follow.       Relevant Medications   amLODipine (NORVASC) 5 MG tablet   Essential hypertension    Continue lisinopril and hctz.  Blood pressure elevated above goal.  Start amlodipine.  Follow  for lower extremity swelling. Follow pressures. Follow metabolic panel.       Relevant Medications   amLODipine (NORVASC) 5 MG tablet   History of colonic polyps    Colonoscopy 12/12/20 - polyp hepatic flexure, polyp - rectum and internal hemorrhoids. Recommended f/u in 7 years.        Hypercholesterolemia    On crestor.  Low cholesterol diet and exercise.  Follow lipid panel and liver function tests.        Relevant Medications   amLODipine (NORVASC) 5 MG tablet   Lower extremity edema    Improved.  Follow.       Secondary diabetes with peripheral circulatory disorder (HCC)    Low carb diet and exercise.  On ozempic and metformin.  Sugars improved.  Follow met b and a1c.  Lower extremity swelling improved.          Relevant Medications   amLODipine (NORVASC) 5 MG tablet   Type 2 diabetes mellitus with hyperglycemia (HCC)    On ozempic and metformin.  Sugars have improved.  Discussed diet and exercise.  Follow met b and a1c.        Other Visit Diagnoses     Need for immunization against influenza    -  Primary   Relevant Orders   Flu Vaccine QUAD 56moIM (Fluarix, Fluzone & Alfiuria Quad PF) (Completed)        CEinar Pheasant MD

## 2022-08-19 ENCOUNTER — Encounter: Payer: Self-pay | Admitting: Podiatry

## 2022-08-19 ENCOUNTER — Ambulatory Visit: Payer: Managed Care, Other (non HMO) | Admitting: Podiatry

## 2022-08-19 DIAGNOSIS — M79675 Pain in left toe(s): Secondary | ICD-10-CM

## 2022-08-19 DIAGNOSIS — I878 Other specified disorders of veins: Secondary | ICD-10-CM | POA: Diagnosis not present

## 2022-08-19 DIAGNOSIS — I890 Lymphedema, not elsewhere classified: Secondary | ICD-10-CM | POA: Diagnosis not present

## 2022-08-19 DIAGNOSIS — I872 Venous insufficiency (chronic) (peripheral): Secondary | ICD-10-CM

## 2022-08-19 DIAGNOSIS — E119 Type 2 diabetes mellitus without complications: Secondary | ICD-10-CM | POA: Diagnosis not present

## 2022-08-19 DIAGNOSIS — B351 Tinea unguium: Secondary | ICD-10-CM

## 2022-08-19 DIAGNOSIS — M79674 Pain in right toe(s): Secondary | ICD-10-CM

## 2022-08-19 NOTE — Progress Notes (Signed)
This patient returns to my office for at risk foot care.  This patient requires this care by a professional since this patient will be at risk due to having diabetes with circulatory disorder.   This patient is unable to cut nails himself since the patient cannot reach his nails.These nails are painful walking and wearing shoes.  This patient presents for at risk foot care today.  General Appearance  Alert, conversant and in no acute stress.  Vascular  Dorsalis pedis and posterior tibial  pulses are weakly/absent  palpable  bilaterally.  Capillary return is within normal limits  bilaterally. Temperature is within normal limits  Bilaterally. Lymphedema left greater than right.  Venous stasis  B/L.  Neurologic  Senn-Weinstein monofilament wire test within normal limits  bilaterally. Muscle power within normal limits bilaterally.  Nails Thick disfigured discolored nails with subungual debris  from hallux to fifth toes bilaterally. No evidence of bacterial infection or drainage bilaterally.  Orthopedic  No limitations of motion  feet .  No crepitus or effusions noted.  No bony pathology or digital deformities noted.  Skin  normotropic skin with no porokeratosis noted bilaterally.  No signs of infections or ulcers noted.     Onychomycosis  Pain in right toes  Pain in left toes  Consent was obtained for treatment procedures.   Mechanical debridement of nails 1-5  bilaterally performed with a nail nipper.  Filed with dremel without incident.    Return office visit   3 months                   Told patient to return for periodic foot care and evaluation due to potential at risk complications.   Tahliyah Anagnos DPM  

## 2022-08-22 ENCOUNTER — Encounter: Payer: Self-pay | Admitting: Internal Medicine

## 2022-08-22 NOTE — Assessment & Plan Note (Signed)
Continue compression hose.  Continue leg elevation.  Swelling improved.  Skin - improved.  Follow.  

## 2022-08-22 NOTE — Assessment & Plan Note (Signed)
Improved.  Follow.  

## 2022-08-22 NOTE — Assessment & Plan Note (Signed)
Continue lisinopril and hctz.  Blood pressure elevated above goal.  Start amlodipine.  Follow for lower extremity swelling. Follow pressures. Follow metabolic panel.

## 2022-08-22 NOTE — Assessment & Plan Note (Signed)
On ozempic and metformin.  Sugars have improved.  Discussed diet and exercise.  Follow met b and a1c.

## 2022-08-22 NOTE — Assessment & Plan Note (Signed)
Colonoscopy 12/12/20 - polyp hepatic flexure, polyp - rectum and internal hemorrhoids. Recommended f/u in 7 years.   

## 2022-08-22 NOTE — Assessment & Plan Note (Signed)
On crestor.  Low cholesterol diet and exercise.  Follow lipid panel and liver function tests.   

## 2022-08-22 NOTE — Assessment & Plan Note (Signed)
Low carb diet and exercise.  On ozempic and metformin.  Sugars improved.  Follow met b and a1c.  Lower extremity swelling improved.

## 2022-08-25 ENCOUNTER — Other Ambulatory Visit: Payer: Self-pay | Admitting: Internal Medicine

## 2022-08-25 DIAGNOSIS — E1165 Type 2 diabetes mellitus with hyperglycemia: Secondary | ICD-10-CM

## 2022-10-05 ENCOUNTER — Ambulatory Visit: Payer: Managed Care, Other (non HMO) | Admitting: Internal Medicine

## 2022-10-05 ENCOUNTER — Encounter: Payer: Self-pay | Admitting: Internal Medicine

## 2022-10-05 VITALS — BP 142/90 | HR 68 | Temp 97.9°F | Resp 17 | Ht 72.0 in | Wt 285.6 lb

## 2022-10-05 DIAGNOSIS — E1165 Type 2 diabetes mellitus with hyperglycemia: Secondary | ICD-10-CM

## 2022-10-05 DIAGNOSIS — Z86010 Personal history of colonic polyps: Secondary | ICD-10-CM

## 2022-10-05 DIAGNOSIS — E876 Hypokalemia: Secondary | ICD-10-CM

## 2022-10-05 DIAGNOSIS — D649 Anemia, unspecified: Secondary | ICD-10-CM

## 2022-10-05 DIAGNOSIS — E7800 Pure hypercholesterolemia, unspecified: Secondary | ICD-10-CM

## 2022-10-05 DIAGNOSIS — Z800 Family history of malignant neoplasm of digestive organs: Secondary | ICD-10-CM

## 2022-10-05 DIAGNOSIS — R600 Localized edema: Secondary | ICD-10-CM

## 2022-10-05 DIAGNOSIS — E1351 Other specified diabetes mellitus with diabetic peripheral angiopathy without gangrene: Secondary | ICD-10-CM

## 2022-10-05 DIAGNOSIS — I872 Venous insufficiency (chronic) (peripheral): Secondary | ICD-10-CM

## 2022-10-05 DIAGNOSIS — I10 Essential (primary) hypertension: Secondary | ICD-10-CM | POA: Diagnosis not present

## 2022-10-05 NOTE — Progress Notes (Signed)
[name], male   DOB: 1961/08/24, 61 y.o.   MRN: [id number]   Subjective:    [name], male    DOB: 1961/05/05, 61 y.o.   MRN: [id number]  Patient here for  Chief Complaint  Patient presents with   Follow-up   Hypertension   .   HPI Reports he is doing relatively well. Has adjusted diet.  Tries to stay active.  Low back issues - better with movement.  No chest pain or sob reported.  No abdominal pain or bowel change reported.  Last A1c 6.6.  discussed diet and exercise.  Discussed blood pressure.     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 ([place])    Pt states controlled by diet for last 4 years   Depression    Environmental allergies    Family history of colon cancer    GERD (gastroesophageal reflux disease)    History of chicken pox    Hyperlipidemia    Hypertension    Venous stasis    Vitamin D deficiency    Past Surgical History:  Procedure Laterality Date   CHOLECYSTECTOMY  2002   COLONOSCOPY WITH PROPOFOL N/A 06/27/2015   Procedure: COLONOSCOPY WITH PROPOFOL;  Surgeon: Lollie Sails, MD;  Location: [facility] ENDOSCOPY;  Service: Endoscopy;  Laterality: N/A;   COLONOSCOPY WITH PROPOFOL N/A 12/12/2020   Procedure: COLONOSCOPY WITH PROPOFOL;  Surgeon: Lesly Rubenstein, MD;  Location: [facility] ENDOSCOPY;  Service: Endoscopy;  Laterality: N/A;   HERNIA REPAIR  1962   Family History  Problem Relation Age of Onset   Stomach cancer Mother    Arthritis Mother    Diabetes Mother    Hypertension Mother    Hyperlipidemia Father    Heart disease Father    Diabetes Father    Hypertension Father    Arthritis Maternal Grandmother    Hyperlipidemia Maternal Grandmother    Hypertension Maternal Grandmother    Arthritis Maternal Grandfather    Hyperlipidemia Maternal Grandfather    Heart disease Maternal Grandfather    Hypertension Maternal Grandfather    Arthritis Paternal  Grandmother    Breast cancer Paternal Grandmother    Hypertension Paternal Grandmother    Arthritis Paternal Grandfather    Hyperlipidemia Paternal Grandfather    Heart disease Paternal Grandfather    Diabetes Paternal Grandfather    Hypertension Paternal Grandfather    Diabetes Sister    Social History   Socioeconomic History   Marital status: Widowed    Spouse name: Not on file   Number of children: Not on file   Years of education: Not on file   Highest education level: Not on file  Occupational History   Not on file  Tobacco Use   Smoking status: Never   Smokeless tobacco: Never  Vaping Use   Vaping Use: Never used  Substance and Sexual Activity   Alcohol use: Yes    Alcohol/week: 0.0 - 1.0 standard drinks of alcohol    Comment: 1 drink every 2 months   Drug use: No   Sexual activity: Not on file  Other Topics Concern   Not on file  Social History Narrative   Not on file   Social Determinants of Health   Financial Resource Strain: Low Risk  (11/20/2021)   Overall Financial Resource Strain (CARDIA)    Difficulty of Paying Living Expenses: Not hard at all  Food Insecurity: Not on file  Transportation  Needs: Not on file  Physical Activity: Inactive (05/29/2020)   Exercise Vital Sign    Days of Exercise per Week: 0 days    Minutes of Exercise per Session: 0 min  Stress: Not on file  Social Connections: Not on file     Review of Systems  Constitutional:  Negative for appetite change and unexpected weight change.  HENT:  Negative for congestion and sinus pressure.   Respiratory:  Negative for cough, chest tightness and shortness of breath.   Cardiovascular:  Negative for chest pain, palpitations and leg swelling.  Gastrointestinal:  Negative for abdominal pain, diarrhea, nausea and vomiting.  Genitourinary:  Negative for difficulty urinating and dysuria.  Musculoskeletal:  Negative for joint swelling and myalgias.  Skin:  Negative for color change and rash.   Neurological:  Negative for dizziness and headaches.  Psychiatric/Behavioral:  Negative for agitation and dysphoric mood.        Objective:     BP (!) 142/90 (BP Location: Left Arm, Patient Position: Sitting, Cuff Size: Large)   Pulse 68   Temp 97.9 F (36.6 C) (Temporal)   Resp 17   Ht 6' (1.829 m)   Wt 285 lb 9.6 oz (129.5 kg)   SpO2 97%   BMI 38.73 kg/m  Wt Readings from Last 3 Encounters:  10/05/22 285 lb 9.6 oz (129.5 kg)  08/16/22 283 lb 3.2 oz (128.5 kg)  04/15/22 290 lb 12.8 oz (131.9 kg)    Physical Exam Vitals reviewed.  Constitutional:      General: He is not in acute distress.    Appearance: Normal appearance. He is well-developed.  HENT:     Head: Normocephalic and atraumatic.     Right Ear: External ear normal.     Left Ear: External ear normal.  Eyes:     General: No scleral icterus.       Right eye: No discharge.        Left eye: No discharge.     Conjunctiva/sclera: Conjunctivae normal.  Cardiovascular:     Rate and Rhythm: Normal rate and regular rhythm.  Pulmonary:     Effort: Pulmonary effort is normal. No respiratory distress.     Breath sounds: Normal breath sounds.  Abdominal:     General: Bowel sounds are normal.     Palpations: Abdomen is soft.     Tenderness: There is no abdominal tenderness.  Musculoskeletal:        General: No swelling or tenderness.     Cervical back: Neck supple. No tenderness.  Lymphadenopathy:     Cervical: No cervical adenopathy.  Skin:    Findings: No erythema or rash.  Neurological:     Mental Status: He is alert.  Psychiatric:        Mood and Affect: Mood normal.        Behavior: Behavior normal.      Outpatient Encounter Medications as of 10/05/2022  Medication Sig   amLODipine (NORVASC) 5 MG tablet Take 1 tablet (5 mg total) by mouth daily.   cholecalciferol (VITAMIN D) 1000 units tablet Take 1,000 Units by mouth daily.   Continuous Blood Gluc Sensor (DEXCOM G7 SENSOR) MISC Apply 1 each  topically as directed.   Continuous Blood Gluc Sensor (FREESTYLE LIBRE 3 SENSOR) MISC Apply 1 each topically every 14 (fourteen) days. Place 1 sensor on the skin every 14 days. Use to check glucose continuously   lisinopril (ZESTRIL) 40 MG tablet TAKE 1 TABLET BY MOUTH  DAILY  loratadine (CLARITIN) 10 MG tablet Take 10 mg by mouth daily as needed for allergies.    metFORMIN (GLUCOPHAGE XR) 500 MG 24 hr tablet Take 2 tablets (1,000 mg total) by mouth daily.   OZEMPIC, 1 MG/DOSE, 4 MG/3ML SOPN INJECT 1 MG UNDER THE SKIN ONCE A WEEK   rosuvastatin (CRESTOR) 10 MG tablet TAKE 1 TABLET BY MOUTH  DAILY   [DISCONTINUED] hydrochlorothiazide (HYDRODIURIL) 25 MG tablet TAKE 1 TABLET(25 MG) BY MOUTH DAILY   No facility-administered encounter medications on file as of 10/05/2022.     Lab Results  Component Value Date   WBC 7.4 01/08/2022   HGB 14.3 01/08/2022   HCT 42.5 01/08/2022   PLT 280 01/08/2022   GLUCOSE 171 (H) 07/05/2022   CHOL 130 07/05/2022   TRIG 100 07/05/2022   HDL 36 (L) 07/05/2022   LDLCALC 75 07/05/2022   ALT 11 07/05/2022   AST 15 07/05/2022   NA 139 07/05/2022   K 3.3 (L) 07/05/2022   CL 97 07/05/2022   CREATININE 0.81 07/05/2022   BUN 19 07/05/2022   CO2 26 07/05/2022   TSH 1.690 01/08/2022   HGBA1C 6.6 (H) 07/05/2022       Assessment & Plan:   Problem List Items Addressed This Visit     Anemia    Follow cbc.       Relevant Orders   CBC w/Diff   Chronic venous insufficiency    Continue compression hose.  Continue leg elevation.  Swelling improved.  Skin - improved.  Follow.       Essential hypertension - Primary    Continue lisinopril and hctz.  Blood pressure elevated above goal.  Was started on amlodipine.  Per report, not takiing daily. Take regularly.  Follow pressures. Follow metabolic panel. Send in readings.       Relevant Orders   Basic Metabolic Panel (BMET)   Family history of colon cancer    Colonoscopy 11/2020.  Per report, recommended f/u  in 7 years.       History of colonic polyps    Colonoscopy 12/12/20 - polyp hepatic flexure, polyp - rectum and internal hemorrhoids. Recommended f/u in 7 years.        Hypercholesterolemia    On crestor.  Low cholesterol diet and exercise.  Follow lipid panel and liver function tests.        Relevant Orders   Hepatic function panel   Lipid Profile   TSH   Hypokalemia    Potassium slightly decreased last labs.  Recheck potassium to confirm wnl.       Lower extremity edema    Improved.  Follow.  Compression hose.       Secondary diabetes with peripheral circulatory disorder (HCC)    Low carb diet and exercise.  On ozempic and metformin.  Sugars improved.  Follow met b and a1c.  Lower extremity swelling improved.          Type 2 diabetes mellitus with hyperglycemia ([facility])    On ozempic and metformin.  Sugars have improved.  Discussed diet and exercise.  Follow met b and a1c.        Relevant Orders   HgB A1c     [name], MD

## 2022-10-06 ENCOUNTER — Other Ambulatory Visit: Payer: Self-pay | Admitting: Internal Medicine

## 2022-10-17 ENCOUNTER — Encounter: Payer: Self-pay | Admitting: Internal Medicine

## 2022-10-17 DIAGNOSIS — E876 Hypokalemia: Secondary | ICD-10-CM | POA: Insufficient documentation

## 2022-10-17 NOTE — Assessment & Plan Note (Signed)
On ozempic and metformin.  Sugars have improved.  Discussed diet and exercise.  Follow met b and a1c.

## 2022-10-17 NOTE — Assessment & Plan Note (Signed)
On crestor.  Low cholesterol diet and exercise.  Follow lipid panel and liver function tests.   

## 2022-10-17 NOTE — Assessment & Plan Note (Signed)
Potassium slightly decreased last labs.  Recheck potassium to confirm wnl.

## 2022-10-17 NOTE — Assessment & Plan Note (Signed)
Improved.  Follow.  Compression hose.

## 2022-10-17 NOTE — Assessment & Plan Note (Signed)
Continue lisinopril and hctz.  Blood pressure elevated above goal.  Was started on amlodipine.  Per report, not takiing daily. Take regularly.  Follow pressures. Follow metabolic panel. Send in readings.

## 2022-10-17 NOTE — Assessment & Plan Note (Signed)
Follow cbc.  

## 2022-10-17 NOTE — Assessment & Plan Note (Signed)
Colonoscopy 11/2020.  Per report, recommended f/u in 7 years.

## 2022-10-17 NOTE — Assessment & Plan Note (Signed)
Continue compression hose.  Continue leg elevation.  Swelling improved.  Skin - improved.  Follow.

## 2022-10-17 NOTE — Assessment & Plan Note (Signed)
Colonoscopy 12/12/20 - polyp hepatic flexure, polyp - rectum and internal hemorrhoids. Recommended f/u in 7 years.

## 2022-10-17 NOTE — Assessment & Plan Note (Signed)
Low carb diet and exercise.  On ozempic and metformin.  Sugars improved.  Follow met b and a1c.  Lower extremity swelling improved.     

## 2022-11-09 ENCOUNTER — Encounter: Payer: Self-pay | Admitting: Internal Medicine

## 2022-11-09 ENCOUNTER — Other Ambulatory Visit: Payer: Self-pay | Admitting: Internal Medicine

## 2022-11-25 ENCOUNTER — Encounter: Payer: Self-pay | Admitting: Podiatry

## 2022-11-25 ENCOUNTER — Ambulatory Visit (INDEPENDENT_AMBULATORY_CARE_PROVIDER_SITE_OTHER): Payer: Managed Care, Other (non HMO) | Admitting: Podiatry

## 2022-11-25 VITALS — BP 127/66 | HR 70

## 2022-11-25 DIAGNOSIS — M79675 Pain in left toe(s): Secondary | ICD-10-CM

## 2022-11-25 DIAGNOSIS — B351 Tinea unguium: Secondary | ICD-10-CM

## 2022-11-25 DIAGNOSIS — I890 Lymphedema, not elsewhere classified: Secondary | ICD-10-CM

## 2022-11-25 DIAGNOSIS — M79674 Pain in right toe(s): Secondary | ICD-10-CM

## 2022-11-25 DIAGNOSIS — E119 Type 2 diabetes mellitus without complications: Secondary | ICD-10-CM | POA: Diagnosis not present

## 2022-11-25 DIAGNOSIS — I878 Other specified disorders of veins: Secondary | ICD-10-CM

## 2022-11-25 NOTE — Progress Notes (Signed)
This patient returns to my office for at risk foot care.  This patient requires this care by a professional since this patient will be at risk due to having diabetes with circulatory disorder.   This patient is unable to cut nails himself since the patient cannot reach his nails.These nails are painful walking and wearing shoes.  This patient presents for at risk foot care today.  General Appearance  Alert, conversant and in no acute stress.  Vascular  Dorsalis pedis and posterior tibial  pulses are weakly/absent  palpable  bilaterally.  Capillary return is within normal limits  bilaterally. Temperature is within normal limits  Bilaterally. Lymphedema left greater than right.  Venous stasis  B/L.  Neurologic  Senn-Weinstein monofilament wire test within normal limits  bilaterally. Muscle power within normal limits bilaterally.  Nails Thick disfigured discolored nails with subungual debris  from hallux to fifth toes bilaterally. No evidence of bacterial infection or drainage bilaterally.  Orthopedic  No limitations of motion  feet .  No crepitus or effusions noted.  No bony pathology or digital deformities noted.  Skin  normotropic skin with no porokeratosis noted bilaterally.  No signs of infections or ulcers noted.     Onychomycosis  Pain in right toes  Pain in left toes  Consent was obtained for treatment procedures.   Mechanical debridement of nails 1-5  bilaterally performed with a nail nipper.  Filed with dremel without incident.    Return office visit   3 months                   Told patient to return for periodic foot care and evaluation due to potential at risk complications.   Gardiner Barefoot DPM

## 2022-12-18 ENCOUNTER — Other Ambulatory Visit: Payer: Self-pay | Admitting: Internal Medicine

## 2022-12-21 ENCOUNTER — Encounter: Payer: Self-pay | Admitting: Internal Medicine

## 2022-12-21 ENCOUNTER — Ambulatory Visit: Payer: Managed Care, Other (non HMO) | Admitting: Internal Medicine

## 2022-12-21 VITALS — BP 130/72 | HR 75 | Temp 97.9°F | Resp 16 | Ht 73.0 in | Wt 286.0 lb

## 2022-12-21 DIAGNOSIS — I10 Essential (primary) hypertension: Secondary | ICD-10-CM

## 2022-12-21 DIAGNOSIS — E1165 Type 2 diabetes mellitus with hyperglycemia: Secondary | ICD-10-CM | POA: Diagnosis not present

## 2022-12-21 DIAGNOSIS — D649 Anemia, unspecified: Secondary | ICD-10-CM | POA: Diagnosis not present

## 2022-12-21 DIAGNOSIS — I872 Venous insufficiency (chronic) (peripheral): Secondary | ICD-10-CM

## 2022-12-21 DIAGNOSIS — R0683 Snoring: Secondary | ICD-10-CM

## 2022-12-21 DIAGNOSIS — R600 Localized edema: Secondary | ICD-10-CM

## 2022-12-21 DIAGNOSIS — Z136 Encounter for screening for cardiovascular disorders: Secondary | ICD-10-CM

## 2022-12-21 DIAGNOSIS — E7800 Pure hypercholesterolemia, unspecified: Secondary | ICD-10-CM

## 2022-12-21 DIAGNOSIS — Z800 Family history of malignant neoplasm of digestive organs: Secondary | ICD-10-CM

## 2022-12-21 NOTE — Progress Notes (Signed)
Subjective:    [name], male    DOB: [birth year], 62 y.o.   MRN: [id number]  Patient here for  Chief Complaint  Patient presents with   Medical Management of Chronic Issues    HPI Here to follow up regarding diabetes and hypertension.  Last visit, blood pressure elevated.  Had not been taking amlodipine daily.  Continues on lisinopril and hctz and back on amlodipine.  Blood pressure better.  Discussed diet and exercise.  No chest pain or sob reported.  No increased cough or congestion.  Does have increased snoring and reported - falls asleep easily.  Discussed possible sleep apnea.  No abdominal pain or bowel change reported.     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 ([place])    Pt states controlled by diet for last 4 years   Depression    Environmental allergies    Family history of colon cancer    GERD (gastroesophageal reflux disease)    History of chicken pox    Hyperlipidemia    Hypertension    Venous stasis    Vitamin D deficiency    Past Surgical History:  Procedure Laterality Date   CHOLECYSTECTOMY  2002   COLONOSCOPY WITH PROPOFOL N/A 06/27/2015   Procedure: COLONOSCOPY WITH PROPOFOL;  Surgeon: Lollie Sails, MD;  [facility]COPY;  Service: Endoscopy;  Laterality: N/A;   COLONOSCOPY WITH PROPOFOL N/A 12/12/2020   Procedure: COLONOSCOPY WITH PROPOFOL;  Surgeon: Lesly Rubenstein, MD;  Location: [facility] ENDOSCOPY;  Service: Endoscopy;  Laterality: N/A;   HERNIA REPAIR  1962   Family History  Problem Relation Age of Onset   Stomach cancer Mother    Arthritis Mother    Diabetes Mother    Hypertension Mother    Hyperlipidemia Father    Heart disease Father    Diabetes Father    Hypertension Father    Arthritis Maternal Grandmother    Hyperlipidemia Maternal Grandmother    Hypertension Maternal Grandmother    Arthritis Maternal Grandfather    Hyperlipidemia Maternal  Grandfather    Heart disease Maternal Grandfather    Hypertension Maternal Grandfather    Arthritis Paternal Grandmother    Breast cancer Paternal Grandmother    Hypertension Paternal Grandmother    Arthritis Paternal Grandfather    Hyperlipidemia Paternal Grandfather    Heart disease Paternal Grandfather    Diabetes Paternal Grandfather    Hypertension Paternal Grandfather    Diabetes Sister    Social History   Socioeconomic History   Marital status: Widowed    Spouse name: Not on file   Number of children: Not on file   Years of education: Not on file   Highest education level: Not on file  Occupational History   Not on file  Tobacco Use   Smoking status: Never   Smokeless tobacco: Never  Vaping Use   Vaping Use: Never used  Substance and Sexual Activity   Alcohol use: Yes    Alcohol/week: 0.0 - 1.0 standard drinks of alcohol    Comment: 1 drink every 2 months   Drug use: No   Sexual activity: Not on file  Other Topics Concern   Not on file  Social History Narrative   Not on file   Social Determinants of Health   Financial Resource Strain: Low Risk  (11/20/2021)   Overall Financial Resource Strain (CARDIA)    Difficulty of Paying Living Expenses: Not hard at  all  Food Insecurity: Not on file  Transportation Needs: Not on file  Physical Activity: Inactive (05/29/2020)   Exercise Vital Sign    Days of Exercise per Week: 0 days    Minutes of Exercise per Session: 0 min  Stress: Not on file  Social Connections: Not on file     Review of Systems  Constitutional:  Negative for appetite change and unexpected weight change.  HENT:  Negative for congestion and sinus pressure.   Respiratory:  Negative for cough, chest tightness and shortness of breath.   Cardiovascular:  Negative for chest pain and palpitations.       Leg swelling overall improved.    Gastrointestinal:  Negative for abdominal pain, diarrhea, nausea and vomiting.  Genitourinary:  Negative for  difficulty urinating and dysuria.  Musculoskeletal:  Negative for joint swelling and myalgias.  Skin:  Negative for color change and rash.  Neurological:  Negative for dizziness and headaches.  Psychiatric/Behavioral:  Negative for agitation and dysphoric mood.        Objective:     BP 130/72   Pulse 75   Temp 97.9 F (36.6 C)   Resp 16   Ht '6\' 1"'$  (1.854 m)   Wt 286 lb (129.7 kg)   SpO2 98%   BMI 37.73 kg/m  Wt Readings from Last 3 Encounters:  12/21/22 286 lb (129.7 kg)  10/05/22 285 lb 9.6 oz (129.5 kg)  08/16/22 283 lb 3.2 oz (128.5 kg)    Physical Exam Vitals reviewed.  Constitutional:      General: He is not in acute distress.    Appearance: Normal appearance. He is well-developed.  HENT:     Head: Normocephalic and atraumatic.     Right Ear: External ear normal.     Left Ear: External ear normal.  Eyes:     General: No scleral icterus.       Right eye: No discharge.        Left eye: No discharge.     Conjunctiva/sclera: Conjunctivae normal.  Cardiovascular:     Rate and Rhythm: Normal rate and regular rhythm.  Pulmonary:     Effort: Pulmonary effort is normal. No respiratory distress.     Breath sounds: Normal breath sounds.  Abdominal:     General: Bowel sounds are normal.     Palpations: Abdomen is soft.     Tenderness: There is no abdominal tenderness.  Musculoskeletal:        General: No tenderness.     Cervical back: Neck supple. No tenderness.     Comments: No increased swelling.   Lymphadenopathy:     Cervical: No cervical adenopathy.  Skin:    Findings: No erythema or rash.  Neurological:     Mental Status: He is alert.  Psychiatric:        Mood and Affect: Mood normal.        Behavior: Behavior normal.      Outpatient Encounter Medications as of 12/21/2022  Medication Sig   amLODipine (NORVASC) 5 MG tablet TAKE 1 TABLET(5 MG) BY MOUTH DAILY   cholecalciferol (VITAMIN D) 1000 units tablet Take 1,000 Units by mouth daily.   Continuous  Blood Gluc Sensor (DEXCOM G7 SENSOR) MISC Apply 1 each topically as directed.   Continuous Blood Gluc Sensor (FREESTYLE LIBRE 3 SENSOR) MISC Apply 1 each topically every 14 (fourteen) days. Place 1 sensor on the skin every 14 days. Use to check glucose continuously   hydrochlorothiazide (HYDRODIURIL) 25 MG tablet  TAKE 1 TABLET(25 MG) BY MOUTH DAILY   lisinopril (ZESTRIL) 40 MG tablet TAKE 1 TABLET BY MOUTH  DAILY   loratadine (CLARITIN) 10 MG tablet Take 10 mg by mouth daily as needed for allergies.    metFORMIN (GLUCOPHAGE XR) 500 MG 24 hr tablet Take 2 tablets (1,000 mg total) by mouth daily.   metFORMIN (GLUCOPHAGE) 500 MG tablet TAKE 2 TABLETS BY MOUTH TWICE DAILY   OZEMPIC, 1 MG/DOSE, 4 MG/3ML SOPN INJECT 1 MG UNDER THE SKIN ONCE A WEEK   rosuvastatin (CRESTOR) 10 MG tablet TAKE 1 TABLET BY MOUTH  DAILY   No facility-administered encounter medications on file as of 12/21/2022.     Lab Results  Component Value Date   WBC 7.5 12/22/2022   HGB 13.8 12/22/2022   HCT 41.7 12/22/2022   PLT 297 12/22/2022   GLUCOSE 151 (H) 12/22/2022   CHOL 122 12/22/2022   TRIG 115 12/22/2022   HDL 36 (L) 12/22/2022   LDLCALC 65 12/22/2022   ALT 12 12/22/2022   AST 10 12/22/2022   NA 141 12/22/2022   K 3.5 12/22/2022   CL 102 12/22/2022   CREATININE 0.77 12/22/2022   BUN 18 12/22/2022   CO2 26 12/22/2022   TSH 1.400 12/22/2022   HGBA1C 7.0 (H) 12/22/2022    No results found.     Assessment & Plan:  Snoring Assessment & Plan: Increased snoring.  Reported to fall asleep easily.  Discussed possible sleep apnea.  Agreeable for evaluation.  Refer to pulmonary.   Orders: -     Ambulatory referral to Pulmonology  Type 2 diabetes mellitus with hyperglycemia, without long-term current use of insulin ([place]) Assessment & Plan: On ozempic and metformin.  Sugars have improved.  Discussed diet and exercise.  Follow met b and a1c.     Anemia, unspecified type Assessment & Plan: Follow cbc.     Chronic venous insufficiency Assessment & Plan: Continue compression hose.  Continue leg elevation.  Swelling improved.  Skin - improved.  Follow.    Essential hypertension Assessment & Plan: Continue lisinopril and hctz and now taking amlodipine regularly.  Pressures improved.  Follow pressures. Follow metabolic panel. Send in readings.    Family history of colon cancer Assessment & Plan: Colonoscopy 11/2020.  Per report, recommended f/u in 7 years.    Hypercholesterolemia Assessment & Plan: On crestor.  Low cholesterol diet and exercise.  Follow lipid panel and liver function tests.     Lower extremity edema Assessment & Plan: Improved.  Follow.  Compression hose.    Encounter for screening for coronary artery disease -     CT CARDIAC SCORING (SELF PAY ONLY); Future     [name], MD

## 2022-12-24 ENCOUNTER — Other Ambulatory Visit: Payer: Self-pay

## 2022-12-24 ENCOUNTER — Telehealth: Payer: Self-pay

## 2022-12-24 DIAGNOSIS — E1165 Type 2 diabetes mellitus with hyperglycemia: Secondary | ICD-10-CM

## 2022-12-24 NOTE — Progress Notes (Signed)
   Care Guide Note  12/24/2022 Name: [name] MRN: [id number] DOB: November 22, 1961  Referred by: [name], MD Reason for referral : Care Coordination (Outreach to schedule referral )   [name] is a 62 y.o. year old male who is a primary care patient of [name], MD. [name] was referred to the pharmacist for assistance related to DM.    Successful contact was made with the patient to discuss pharmacy services including being ready for the pharmacist to call at least 5 minutes before the scheduled appointment time, to have medication bottles and any blood sugar or blood pressure readings ready for review. The patient agreed to meet with the pharmacist via with the pharmacist via telephone visit on (date/time).  02/10/2023  Noreene Larsson, [place], [place] [address] Direct Dial: [phone] [email or web address]

## 2022-12-26 ENCOUNTER — Encounter: Payer: Self-pay | Admitting: Internal Medicine

## 2022-12-26 DIAGNOSIS — R0683 Snoring: Secondary | ICD-10-CM | POA: Insufficient documentation

## 2022-12-26 LAB — CBC WITH DIFFERENTIAL/PLATELET
Basophils Absolute: 0 10*3/uL (ref 0.0–0.2)
Basos: 1 %
EOS (ABSOLUTE): 0.3 10*3/uL (ref 0.0–0.4)
Eos: 3 %
Hematocrit: 41.7 % (ref 37.5–51.0)
Hemoglobin: 13.8 g/dL (ref 13.0–17.7)
Immature Grans (Abs): 0 10*3/uL (ref 0.0–0.1)
Immature Granulocytes: 0 %
Lymphocytes Absolute: 1.9 10*3/uL (ref 0.7–3.1)
Lymphs: 25 %
MCH: 29.6 pg (ref 26.6–33.0)
MCHC: 33.1 g/dL (ref 31.5–35.7)
MCV: 89 fL (ref 79–97)
Monocytes Absolute: 0.7 10*3/uL (ref 0.1–0.9)
Monocytes: 9 %
Neutrophils Absolute: 4.6 10*3/uL (ref 1.4–7.0)
Neutrophils: 62 %
Platelets: 297 10*3/uL (ref 150–450)
RBC: 4.67 x10E6/uL (ref 4.14–5.80)
RDW: 13.1 % (ref 11.6–15.4)
WBC: 7.5 10*3/uL (ref 3.4–10.8)

## 2022-12-26 LAB — HEPATIC FUNCTION PANEL
ALT: 12 IU/L (ref 0–44)
AST: 10 IU/L (ref 0–40)
Albumin: 3.6 g/dL — ABNORMAL LOW (ref 3.9–4.9)
Alkaline Phosphatase: 105 IU/L (ref 44–121)
Bilirubin Total: 0.2 mg/dL (ref 0.0–1.2)
Bilirubin, Direct: <0.1 mg/dL (ref 0.00–0.40)
Total Protein: 6.4 g/dL (ref 6.0–8.5)

## 2022-12-26 LAB — BASIC METABOLIC PANEL
BUN/Creatinine Ratio: 23 (ref 10–24)
BUN: 18 mg/dL (ref 8–27)
CO2: 26 mmol/L (ref 20–29)
Calcium: 8.9 mg/dL (ref 8.6–10.2)
Chloride: 102 mmol/L (ref 96–106)
Creatinine, Ser: 0.77 mg/dL (ref 0.76–1.27)
Glucose: 151 mg/dL — ABNORMAL HIGH (ref 70–99)
Potassium: 3.5 mmol/L (ref 3.5–5.2)
Sodium: 141 mmol/L (ref 134–144)
eGFR: 102 mL/min/{1.73_m2} (ref >59)

## 2022-12-26 LAB — LIPID PANEL
Chol/HDL Ratio: 3.4 ratio (ref 0.0–5.0)
Cholesterol, Total: 122 mg/dL (ref 100–199)
HDL: 36 mg/dL — ABNORMAL LOW (ref >39)
LDL Chol Calc (NIH): 65 mg/dL (ref 0–99)
Triglycerides: 115 mg/dL (ref 0–149)
VLDL Cholesterol Cal: 21 mg/dL (ref 5–40)

## 2022-12-26 LAB — HEMOGLOBIN A1C
Est. average glucose Bld gHb Est-mCnc: 154 mg/dL
Hgb A1c MFr Bld: 7 % — ABNORMAL HIGH (ref 4.8–5.6)

## 2022-12-26 LAB — TSH: TSH: 1.4 u[IU]/mL (ref 0.450–4.500)

## 2022-12-26 NOTE — Assessment & Plan Note (Signed)
On ozempic and metformin.  Sugars have improved.  Discussed diet and exercise.  Follow met b and a1c.   

## 2022-12-26 NOTE — Assessment & Plan Note (Signed)
Continue lisinopril and hctz and now taking amlodipine regularly.  Pressures improved.  Follow pressures. Follow metabolic panel. Send in readings.

## 2022-12-26 NOTE — Assessment & Plan Note (Signed)
Colonoscopy 11/2020.  Per report, recommended f/u in 7 years.

## 2022-12-26 NOTE — Assessment & Plan Note (Signed)
On crestor.  Low cholesterol diet and exercise.  Follow lipid panel and liver function tests.   

## 2022-12-26 NOTE — Assessment & Plan Note (Signed)
Continue compression hose.  Continue leg elevation.  Swelling improved.  Skin - improved.  Follow.

## 2022-12-26 NOTE — Assessment & Plan Note (Signed)
Increased snoring.  Reported to fall asleep easily.  Discussed possible sleep apnea.  Agreeable for evaluation.  Refer to pulmonary.

## 2022-12-26 NOTE — Assessment & Plan Note (Signed)
Improved.  Follow.  Compression hose.

## 2022-12-26 NOTE — Assessment & Plan Note (Signed)
Follow cbc.  

## 2023-01-05 ENCOUNTER — Ambulatory Visit
Admission: RE | Admit: 2023-01-05 | Discharge: 2023-01-05 | Disposition: A | Discharge status: Home / Self Care | Payer: Managed Care, Other (non HMO) | Source: Ambulatory Visit | Attending: Internal Medicine | Admitting: Internal Medicine

## 2023-01-05 DIAGNOSIS — Z136 Encounter for screening for cardiovascular disorders: Secondary | ICD-10-CM | POA: Insufficient documentation

## 2023-02-01 ENCOUNTER — Other Ambulatory Visit: Payer: Self-pay | Admitting: Internal Medicine

## 2023-02-01 DIAGNOSIS — E1165 Type 2 diabetes mellitus with hyperglycemia: Secondary | ICD-10-CM

## 2023-02-10 ENCOUNTER — Other Ambulatory Visit: Payer: Managed Care, Other (non HMO) | Admitting: Pharmacist

## 2023-02-10 MED ORDER — METFORMIN HCL 500 MG PO TABS: 1000.0000 mg | ORAL_TABLET | Freq: Every day | ORAL | 1 refills | Status: DC

## 2023-02-10 NOTE — Progress Notes (Signed)
02/10/2023 Name: [name] MRN: [id number] DOB: 12-24-1960  Chief Complaint  Patient presents with   Medication Management   Diabetes    [name] is a 62 y.o. year old male who presented for a telephone visit.   They were referred to the pharmacist by their PCP for assistance in managing diabetes.    Subjective:  Care Team: Primary Care Provider: [name], MD ; Next Scheduled Visit: 04/22/23  Medication Access/Adherence  Current Pharmacy:  [facility] ([place], [facility] [address] [place] [address] [place] [address] Phone: [phone] Fax: [place] [id number] [place], [place] - [place] [address] [place] [address] Phone: [phone] Fax: [place] [id number] [place], [place] - [address] [place] [address] Phone: [phone] Fax: [phone]  [place], [place] [address] Phone: [phone] Fax: [phone]   Patient reports affordability concerns with their medications: No  Patient reports access/transportation concerns to their pharmacy: No  Patient reports adherence concerns with their medications:  No     Diabetes:  Current medications: metformin 1000 mg twice daily- though reports he has been taking 1000 mg daily; Ozempic 1 mg weekly  Current glucose readings: fastings more 110-120s; reports readings remain 140-180 throughout the day; using DexCom but not connected to patient portal yet  Patient denies hypoglycemic s/sx including dizziness, shakiness, sweating. Patient denies hyperglycemic symptoms including polyuria, polydipsia, polyphagia, nocturia, neuropathy, blurred vision.  Current meal patterns:  - Breakfast: ensures he gets protein; if a bagel, he  has [place] or ham or cheese on there; - Lunch: leftovers from the day before  - Supper: meat and vegetables combos, cooks generally but will occasionally get out as he and his partner both work from home - Snacks: tries for protein based snacks;  - Drinks: diet soda, some unsweet tea; water   Current physical activity: reports he wants to work on this; notes that he has been struggling with fatigue lately (upcoming sleep study);   Hypertension:  Current medications: amlodipine 5 mg daily, lisinopril 40 mg daily, HCTZ 25 mg daily  Patient has a validated, automated, upper arm home BP cuff Current blood pressure readings readings: has not been checking at home lately.   Hyperlipidemia/ASCVD Risk Reduction  Current lipid lowering medications: rosuvastatin 10 mg daily  CAC score = 0   Objective:  Lab Results  Component Value Date   HGBA1C 7.0 (H) 12/22/2022    Lab Results  Component Value Date   CREATININE 0.77 12/22/2022   BUN 18 12/22/2022   NA 141 12/22/2022   K 3.5 12/22/2022   CL 102 12/22/2022   CO2 26 12/22/2022    Lab Results  Component Value Date   CHOL 122 12/22/2022   HDL 36 (L) 12/22/2022   LDLCALC 65 12/22/2022   TRIG 115 12/22/2022   CHOLHDL 3.4 12/22/2022    Medications Reviewed Today     Reviewed by Osker Mason, RPH-CPP (Pharmacist) on 02/10/23 at 66  Med List Status: <None>   Medication Order Taking? Sig Documenting Provider Last Dose Status Informant  amLODipine (NORVASC) 5 MG tablet [id number] Yes TAKE 1 TABLET(5 MG) BY MOUTH DAILY [name], MD Taking Active   cholecalciferol (VITAMIN D)  1000 units tablet [id number] Yes Take 1,000 Units by mouth daily. [provider] Taking Active Self  Continuous Blood Gluc Sensor (DEXCOM G7 SENSOR) MISC [id number] Yes APPLY 1 SENSOR TOPICALLY AS DIRECTED [name], MD Taking Active   hydrochlorothiazide (HYDRODIURIL) 25 MG tablet [id number] Yes TAKE 1 TABLET(25 MG) BY MOUTH DAILY  [name], MD Taking Active   lisinopril (ZESTRIL) 40 MG tablet [id number] Yes TAKE 1 TABLET BY MOUTH  DAILY Dutch Quint B, FNP Taking Active   loratadine (CLARITIN) 10 MG tablet [id number] Yes Take 10 mg by mouth daily as needed for allergies.  [provider] Taking Active   metFORMIN (GLUCOPHAGE) 500 MG tablet [id number] Yes TAKE 2 TABLETS BY MOUTH TWICE DAILY [name], MD Taking Active            Med Note Jodi Mourning, Tillie Fantasia Feb 10, 2023  1:09 PM) Taking 2 tablets daily  OZEMPIC, 1 MG/DOSE, 4 MG/3ML [place] Aid [id number] Yes INJECT 1 MG UNDER THE SKIN ONCE A WEEK [name], MD Taking Active   rosuvastatin (CRESTOR) 10 MG tablet [id number] Yes TAKE 1 TABLET BY MOUTH  DAILY [name], MD Taking Active               Assessment/Plan:   Diabetes: - Currently uncontrolled - Reviewed long term cardiovascular and renal outcomes of uncontrolled blood sugar - Reviewed goal A1c, goal fasting, and goal 2 hour post prandial glucose - Reviewed dietary modifications including: focus on lean proteins, vegetables, whole grains, moderating carbohydrate portion sizes - Reviewed lifestyle modifications including: goal of increasing physical activity - Discussed focus on nutritional/lifestyle modifications vs increasing metformin to 2000 mg daily vs increasing Ozempic to 2 mg weekly. Patient elects to continue current medication regimen and focus on nutritional and dietary changes.  - Recommend to check glucose using CGM. Will send link to [place].   Hypertension: - Currently controlled - Reviewed long term cardiovascular and renal outcomes of uncontrolled blood pressure - Reviewed appropriate blood pressure monitoring technique and reviewed goal blood pressure. Recommended to check home blood pressure and heart rate periodically - Recommend to continue current regimen  Hyperlipidemia/ASCVD Risk Reduction: - Currently controlled. - Recommend to continue  current regimen at this time   Follow Up Plan: phone call in 8 weeks   Catie Hedwig Morton, PharmD, [place], [facility] [phone]

## 2023-02-10 NOTE — Patient Instructions (Addendum)
[name],   Accept an Invitation to [employer] professional uses their Dexcom Clarity account to invite you to share data with their clinic. They will provide the invitation in print or by email.  The invitation includes a sharing code that you enter on your [facility] page to accept the invitation before the expiration date shown. Once you have completed the acceptance, your account and the clinic's account will automatically share data between them.  You can accept the invitation from an internet browser or from a smart device with the [place].  To accept the invitation from an internet browser, log into your Dexcom Clarity account:  Click Settings.  Click Share Data with a New Clinic.  Enter the sharing code (ours is [id number])  and your date of birth.  Click Continue.  Click the I consent to share my data with my clinic box.  Click Yes, Share My Data on the verification page to confirm sharing.  Click Close on the confirmation page.  Please let me know if you have any questions.   Thanks!  [name], PharmD

## 2023-02-24 ENCOUNTER — Ambulatory Visit: Payer: Managed Care, Other (non HMO) | Admitting: Podiatry

## 2023-02-24 ENCOUNTER — Encounter: Payer: Self-pay | Admitting: Podiatry

## 2023-02-24 VITALS — BP 146/75 | HR 71

## 2023-02-24 DIAGNOSIS — E119 Type 2 diabetes mellitus without complications: Secondary | ICD-10-CM | POA: Diagnosis not present

## 2023-02-24 DIAGNOSIS — M79675 Pain in left toe(s): Secondary | ICD-10-CM

## 2023-02-24 DIAGNOSIS — B351 Tinea unguium: Secondary | ICD-10-CM

## 2023-02-24 DIAGNOSIS — M79674 Pain in right toe(s): Secondary | ICD-10-CM | POA: Diagnosis not present

## 2023-02-24 NOTE — Progress Notes (Signed)
This patient returns to my office for at risk foot care.  This patient requires this care by a professional since this patient will be at risk due to having diabetes with circulatory disorder.   This patient is unable to cut nails himself since the patient cannot reach his nails.These nails are painful walking and wearing shoes.  This patient presents for at risk foot care today.  General Appearance  Alert, conversant and in no acute stress.  Vascular  Dorsalis pedis and posterior tibial  pulses are weakly/absent  palpable  bilaterally.  Capillary return is within normal limits  bilaterally. Temperature is within normal limits  Bilaterally. Lymphedema left greater than right.  Venous stasis  B/L.  Neurologic  Senn-Weinstein monofilament wire test within normal limits  bilaterally. Muscle power within normal limits bilaterally.  Nails Thick disfigured discolored nails with subungual debris  from hallux to fifth toes bilaterally. No evidence of bacterial infection or drainage bilaterally.  Orthopedic  No limitations of motion  feet .  No crepitus or effusions noted.  No bony pathology or digital deformities noted.  Skin  normotropic skin with no porokeratosis noted bilaterally.  No signs of infections or ulcers noted.     Onychomycosis  Pain in right toes  Pain in left toes  Consent was obtained for treatment procedures.   Mechanical debridement of nails 1-5  bilaterally performed with a nail nipper.  Filed with dremel without incident.    Return office visit   3 months                   Told patient to return for periodic foot care and evaluation due to potential at risk complications.   Teena Mangus DPM  

## 2023-03-10 ENCOUNTER — Encounter: Payer: Self-pay | Admitting: Internal Medicine

## 2023-03-10 ENCOUNTER — Ambulatory Visit: Payer: Managed Care, Other (non HMO) | Admitting: Internal Medicine

## 2023-03-10 VITALS — BP 136/70 | HR 87 | Temp 98.0°F | Ht 73.0 in | Wt 287.0 lb

## 2023-03-10 DIAGNOSIS — G4719 Other hypersomnia: Secondary | ICD-10-CM | POA: Diagnosis not present

## 2023-03-10 NOTE — Progress Notes (Signed)
Name: THARUN CAPPELLA MRN: [id number] DOB: [birth year]    CHIEF COMPLAINT:  EXCESSIVE DAYTIME SLEEPINESS   HISTORY OF PRESENT ILLNESS: Patient is seen today for problems and issues with sleep related to excessive daytime sleepiness Patient  has been having sleep problems for many years Patient has been having excessive daytime sleepiness for a long time Patient has been having extreme fatigue and tiredness, lack of energy +  very Loud snoring every night + Nonrefreshing sleep   Encouraged proper weight management.  Discussed driving precautions and its relationship with hypersomnolence.  Discussed operating dangerous equipment and its relationship with hypersomnolence.  Discussed sleep hygiene, and benefits of a fixed sleep waked time.  The importance of getting eight or more hours of sleep discussed with patient.  Discussed limiting the use of the computer and television before bedtime.  Decrease naps during the day, so night time sleep will become enhanced.  Limit caffeine, and sleep deprivation.  HTN, stroke, and heart failure are potential risk factors.    EPWORTH SLEEP SCORE  20    PAST MEDICAL HISTORY :   has a past medical history of Controlled diabetes mellitus with peripheral circulatory disorder, Depression, Environmental allergies, Family history of colon cancer, GERD (gastroesophageal reflux disease), History of chicken pox, Hyperlipidemia, Hypertension, Venous stasis, and Vitamin D deficiency.  has a past surgical history that includes Cholecystectomy (2002); Hernia repair [phone]); Colonoscopy with propofol (N/A, 06/27/2015); and Colonoscopy with propofol (N/A, 12/12/2020). Prior to Admission medications   Medication Sig Start Date End Date Taking? Authorizing Provider  amLODipine (NORVASC) 5 MG tablet TAKE 1 TABLET(5 MG) BY MOUTH DAILY 12/20/22   Dale Arabi, MD  cholecalciferol (VITAMIN D) 1000 units tablet Take 1,000 Units by mouth daily.    [provider]  Continuous Blood Gluc Sensor (DEXCOM G7 SENSOR) MISC APPLY 1 SENSOR TOPICALLY AS DIRECTED 02/01/23   Dale [place], MD  hydrochlorothiazide (HYDRODIURIL) 25 MG tablet TAKE 1 TABLET(25 MG) BY MOUTH DAILY 10/06/22   Dale [place], MD  lisinopril (ZESTRIL) 40 MG tablet TAKE 1 TABLET BY MOUTH  DAILY 06/06/22   Worthy Rancher B, FNP  loratadine (CLARITIN) 10 MG tablet Take 10 mg by mouth daily as needed for allergies.     [provider]  metFORMIN (GLUCOPHAGE) 500 MG tablet Take 2 tablets (1,000 mg total) by mouth daily. 02/10/23   Dale [place], MD  OZEMPIC, 1 MG/DOSE, 4 MG/3ML SOPN INJECT 1 MG UNDER THE SKIN ONCE A WEEK 08/25/22   Dale [place], MD  rosuvastatin (CRESTOR) 10 MG tablet TAKE 1 TABLET BY MOUTH  DAILY 05/26/22   Dale [place], MD   No Known Allergies  FAMILY HISTORY:  family history includes Arthritis in his maternal grandfather, maternal grandmother, mother, paternal grandfather, and paternal grandmother; Breast cancer in his paternal grandmother; Diabetes in his father, mother, paternal grandfather, and sister; Heart disease in his father, maternal grandfather, and paternal grandfather; Hyperlipidemia in his father, maternal grandfather, maternal grandmother, and paternal grandfather; Hypertension in his father, maternal grandfather, maternal grandmother, mother, paternal grandfather, and paternal grandmother; Stomach cancer in his mother. SOCIAL HISTORY:  reports that he has never smoked. He has never used smokeless tobacco. He reports current alcohol use. He reports that he does not use drugs.   Review of Systems:  Gen:  Denies  fever, sweats, chills weight loss  HEENT: Denies blurred vision, double vision, ear pain, eye pain, hearing loss, nose bleeds, sore throat Cardiac:  No dizziness, chest pain or heaviness, chest tightness,edema,  No JVD Resp:   No cough, -sputum production, -shortness of breath,-wheezing, -hemoptysis,  Gi: Denies swallowing  difficulty, stomach pain, nausea or vomiting, diarrhea, constipation, bowel incontinence Gu:  Denies bladder incontinence, burning urine Ext:   Denies Joint pain, stiffness or swelling Skin: Denies  skin rash, easy bruising or bleeding or hives Endoc:  Denies polyuria, polydipsia , polyphagia or weight change Psych:   Denies depression, insomnia or hallucinations  Other:  All other systems negative   ALL OTHER ROS ARE NEGATIVE  BP 136/70 (BP Location: Right Arm, Cuff Size: Normal)   Pulse 87   Temp 98 F (36.7 C) (Temporal)   Ht  (1.854 m)   Wt 287 lb (130.2 kg)   SpO2 96%   BMI 37.87 kg/m     Physical Examination:   General Appearance: No distress  EYES PERRLA, EOM intact.   NECK Supple, No JVD Pulmonary: normal breath sounds, No wheezing.  CardiovascularNormal S1,S2.  No m/r/g.   Abdomen: Benign, Soft, non-tender. Skin:   warm, no rashes, no ecchymosis  Extremities: normal, no cyanosis, clubbing. Neuro:without focal findings,  speech normal  PSYCHIATRIC: Mood, affect within normal limits.   ALL OTHER ROS ARE NEGATIVE    ASSESSMENT AND PLAN SYNOPSIS  Patient with signs and symptoms of excessive daytime sleepiness with probable underlying diagnosis of obstructive sleep apnea in the setting of obesity and deconditioned state   Recommend Sleep Study for definitve diagnosis  Obesity -recommend significant weight loss -recommend changing diet  Deconditioned state -Recommend increased daily activity and exercise   MEDICATION ADJUSTMENTS/LABS AND TESTS ORDERED: Recommend Sleep Study Recommend weight loss   CURRENT MEDICATIONS REVIEWED AT LENGTH WITH PATIENT TODAY   Patient  satisfied with Plan of action and management. All questions answered  Follow up  3 months  Total Time Spent  35 mins   Wallis Bamberg Santiago Glad, M.D.  [facility]  Medical Director [facility] Medical Director [facility] Cardio-Pulmonary Department

## 2023-03-10 NOTE — Patient Instructions (Signed)
Recommend home sleep study to assess for sleep apnea Recommend weight loss 

## 2023-03-29 ENCOUNTER — Telehealth: Payer: Self-pay | Admitting: Pharmacy Technician

## 2023-04-07 ENCOUNTER — Other Ambulatory Visit: Payer: Managed Care, Other (non HMO) | Admitting: Pharmacist

## 2023-04-16 ENCOUNTER — Other Ambulatory Visit (HOSPITAL_COMMUNITY): Payer: Self-pay

## 2023-04-16 NOTE — Telephone Encounter (Signed)
Pharmacy Patient Advocate Encounter  Prior Authorization for Dexcom G7 Sensor has been approved by [employer] (ins).    PA # [id number] Effective dates: 03/24/2023 through 03/23/2024   Melanee Spry CPhT Rx Patient Advocate [phone]

## 2023-04-22 ENCOUNTER — Encounter: Payer: Self-pay | Admitting: Internal Medicine

## 2023-04-22 ENCOUNTER — Ambulatory Visit (INDEPENDENT_AMBULATORY_CARE_PROVIDER_SITE_OTHER): Payer: Managed Care, Other (non HMO) | Admitting: Internal Medicine

## 2023-04-22 VITALS — BP 138/80 | HR 72 | Temp 98.0°F | Resp 16 | Ht 73.0 in | Wt 288.0 lb

## 2023-04-22 DIAGNOSIS — I10 Essential (primary) hypertension: Secondary | ICD-10-CM | POA: Diagnosis not present

## 2023-04-22 DIAGNOSIS — Z7984 Long term (current) use of oral hypoglycemic drugs: Secondary | ICD-10-CM

## 2023-04-22 DIAGNOSIS — E1351 Other specified diabetes mellitus with diabetic peripheral angiopathy without gangrene: Secondary | ICD-10-CM

## 2023-04-22 DIAGNOSIS — E7800 Pure hypercholesterolemia, unspecified: Secondary | ICD-10-CM

## 2023-04-22 DIAGNOSIS — I890 Lymphedema, not elsewhere classified: Secondary | ICD-10-CM

## 2023-04-22 DIAGNOSIS — Z860100 Personal history of colon polyps, unspecified: Secondary | ICD-10-CM

## 2023-04-22 DIAGNOSIS — E1165 Type 2 diabetes mellitus with hyperglycemia: Secondary | ICD-10-CM

## 2023-04-22 DIAGNOSIS — E559 Vitamin D deficiency, unspecified: Secondary | ICD-10-CM

## 2023-04-22 DIAGNOSIS — Z0000 Encounter for general adult medical examination without abnormal findings: Secondary | ICD-10-CM

## 2023-04-22 LAB — HM DIABETES FOOT EXAM

## 2023-04-22 NOTE — Assessment & Plan Note (Signed)
Physical today 04/22/23.  Colonoscopy 11/2020 as outlined.  PSA 07/05/22 - .5.

## 2023-04-22 NOTE — Progress Notes (Unsigned)
Subjective:    [name], male    DOB: [birth year], 62 y.o.   MRN: [id number]  Patient here for  Chief Complaint  Patient presents with   Annual Exam    HPI Here for his physical exam.  Saw Dr Belia Heman 03/10/23 - recommended sleep study. On ozempic and metformin. Just recently traveled to [place].  Was active.  Discussed diet and exercise.  Reports sugars mostly averaging <180. No chest pain reported.  Breathing stable.  No abdominal pain or bowel change reported.     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)    Pt states controlled by diet for last 4 years   Depression    Environmental allergies    Family history of colon cancer    GERD (gastroesophageal reflux disease)    History of chicken pox    Hyperlipidemia    Hypertension    Venous stasis    Vitamin D deficiency    Past Surgical History:  Procedure Laterality Date   CHOLECYSTECTOMY  2002   COLONOSCOPY WITH PROPOFOL N/A 06/27/2015   Procedure: COLONOSCOPY WITH PROPOFOL;  Surgeon: Christena Deem, MD;  Location[facility] ENDOSCOPY;  Service: Endoscopy;  Laterality: N/A;   COLONOSCOPY WITH PROPOFOL N/A 12/12/2020   Procedure: COLONOSCOPY WITH PROPOFOL;  Surgeon: Regis Bill, MD;  Location: [facility] ENDOSCOPY;  Service: Endoscopy;  Laterality: N/A;   HERNIA REPAIR  1962   Family History  Problem Relation Age of Onset   Stomach cancer Mother    Arthritis Mother    Diabetes Mother    Hypertension Mother    Hyperlipidemia Father    Heart disease Father    Diabetes Father    Hypertension Father    Arthritis Maternal Grandmother    Hyperlipidemia Maternal Grandmother    Hypertension Maternal Grandmother    Arthritis Maternal Grandfather    Hyperlipidemia Maternal Grandfather    Heart disease Maternal Grandfather    Hypertension Maternal Grandfather    Arthritis Paternal Grandmother    Breast cancer Paternal Grandmother     Hypertension Paternal Grandmother    Arthritis Paternal Grandfather    Hyperlipidemia Paternal Grandfather    Heart disease Paternal Grandfather    Diabetes Paternal Grandfather    Hypertension Paternal Grandfather    Diabetes Sister    Social History   Socioeconomic History   Marital status: Widowed    Spouse name: Not on file   Number of children: Not on file   Years of education: Not on file   Highest education level: Not on file  Occupational History   Not on file  Tobacco Use   Smoking status: Never   Smokeless tobacco: Never  Vaping Use   Vaping Use: Never used  Substance and Sexual Activity   Alcohol use: Yes    Alcohol/week: 0.0 - 1.0 standard drinks of alcohol    Comment: 1 drink every 2 months   Drug use: No   Sexual activity: Not on file  Other Topics Concern   Not on file  Social History Narrative   Not on file   Social Determinants of Health   Financial Resource Strain: Low Risk  (11/20/2021)   Overall Financial Resource Strain (CARDIA)    Difficulty of Paying Living Expenses: Not hard at all  Food Insecurity: Not on file  Transportation Needs: Not on file  Physical Activity: Inactive (05/29/2020)   Exercise Vital Sign    Days of  Exercise per Week: 0 days    Minutes of Exercise per Session: 0 min  Stress: Not on file  Social Connections: Not on file     Review of Systems  Constitutional:  Negative for appetite change and unexpected weight change.  HENT:  Negative for congestion, sinus pressure and sore throat.   Eyes:  Negative for pain and visual disturbance.  Respiratory:  Negative for cough, chest tightness and shortness of breath.   Cardiovascular:  Negative for chest pain and palpitations.  Gastrointestinal:  Negative for abdominal pain, diarrhea, nausea and vomiting.  Genitourinary:  Negative for difficulty urinating and dysuria.  Musculoskeletal:  Negative for joint swelling and myalgias.  Skin:  Negative for color change and rash.        Chronic venostasis changes.  No acute erythema - lower extremities.   Neurological:  Negative for dizziness and headaches.  Hematological:  Negative for adenopathy. Does not bruise/bleed easily.  Psychiatric/Behavioral:  Negative for agitation and dysphoric mood.        Objective:     BP 138/80   Pulse 72   Temp 98 F (36.7 C)   Resp 16   Ht 6\' 1"  (1.854 m)   Wt 288 lb (130.6 kg)   SpO2 98%   BMI 38.00 kg/m  Wt Readings from Last 3 Encounters:  04/22/23 288 lb (130.6 kg)  03/10/23 287 lb (130.2 kg)  12/21/22 286 lb (129.7 kg)    Physical Exam Constitutional:      General: He is not in acute distress.    Appearance: Normal appearance. He is well-developed.  HENT:     Head: Normocephalic and atraumatic.     Right Ear: External ear normal.     Left Ear: External ear normal.  Eyes:     General: No scleral icterus.       Right eye: No discharge.        Left eye: No discharge.     Conjunctiva/sclera: Conjunctivae normal.  Neck:     Thyroid: No thyromegaly.  Cardiovascular:     Rate and Rhythm: Normal rate and regular rhythm.  Pulmonary:     Effort: No respiratory distress.     Breath sounds: Normal breath sounds. No wheezing.  Abdominal:     General: Bowel sounds are normal.     Palpations: Abdomen is soft.     Tenderness: There is no abdominal tenderness.  Musculoskeletal:        General: No tenderness.     Cervical back: Neck supple. No tenderness.     Comments: No increased swelling.  Chronic venostasis changes. DP pulses palpable and equal bilaterally.   Lymphadenopathy:     Cervical: No cervical adenopathy.  Skin:    Findings: No erythema or rash.  Neurological:     Mental Status: He is alert and oriented to person, place, and time.  Psychiatric:        Mood and Affect: Mood normal.        Behavior: Behavior normal.      Outpatient Encounter Medications as of 04/22/2023  Medication Sig   amLODipine (NORVASC) 5 MG tablet TAKE 1 TABLET(5 MG) BY MOUTH  DAILY   cholecalciferol (VITAMIN D) 1000 units tablet Take 1,000 Units by mouth daily.   Continuous Blood Gluc Sensor (DEXCOM G7 SENSOR) MISC APPLY 1 SENSOR TOPICALLY AS DIRECTED   hydrochlorothiazide (HYDRODIURIL) 25 MG tablet TAKE 1 TABLET(25 MG) BY MOUTH DAILY   lisinopril (ZESTRIL) 40 MG tablet TAKE 1 TABLET BY  MOUTH  DAILY   loratadine (CLARITIN) 10 MG tablet Take 10 mg by mouth daily as needed for allergies.    metFORMIN (GLUCOPHAGE) 500 MG tablet Take 2 tablets (1,000 mg total) by mouth daily.   OZEMPIC, 1 MG/DOSE, 4 MG/3ML SOPN INJECT 1 MG UNDER THE SKIN ONCE A WEEK   rosuvastatin (CRESTOR) 10 MG tablet TAKE 1 TABLET BY MOUTH  DAILY   No facility-administered encounter medications on file as of 04/22/2023.     Lab Results  Component Value Date   WBC 7.5 12/22/2022   HGB 13.8 12/22/2022   HCT 41.7 12/22/2022   PLT 297 12/22/2022   GLUCOSE 151 (H) 12/22/2022   CHOL 122 12/22/2022   TRIG 115 12/22/2022   HDL 36 (L) 12/22/2022   LDLCALC 65 12/22/2022   ALT 12 12/22/2022   AST 10 12/22/2022   NA 141 12/22/2022   K 3.5 12/22/2022   CL 102 12/22/2022   CREATININE 0.77 12/22/2022   BUN 18 12/22/2022   CO2 26 12/22/2022   TSH 1.400 12/22/2022   HGBA1C 7.0 (H) 12/22/2022    CT CARDIAC SCORING  Addendum Date: 01/10/2023   ADDENDUM REPORT: 01/10/2023 10:40 EXAM: OVER-READ INTERPRETATION  CT CHEST The following report is an over-read performed by radiologist Dr. Maryelizabeth Rowan [facility] on 01/10/2023. This over-read does not include interpretation of cardiac or coronary anatomy or pathology. The calcium score interpretation by the cardiologist is attached. COMPARISON:  None. FINDINGS: Limited view of the lung parenchyma demonstrates no suspicious nodularity. Airways are normal. Limited view of the mediastinum demonstrates no adenopathy. Esophagus normal. Limited view of the upper abdomen unremarkable. Limited view of the skeleton and chest wall is unremarkable.  IMPRESSION: No significant extracardiac findings. Electronically Signed   By: Genevive Bi M.D.   On: 01/10/2023 10:40   Result Date: 01/10/2023 CLINICAL DATA:  Risk stratification EXAM: Coronary Calcium Score TECHNIQUE: The patient was scanned on a Siemens Somatom go.Top Scanner. Axial non-contrast 3 mm slices were carried out through the heart. The data set was analyzed on a dedicated work station and scored using the Agatson method. FINDINGS: Non-cardiac: See separate report from [facility]. Ascending Aorta: Normal size Pericardium: Normal Coronary arteries: Normal origin of left and right coronary arteries. Distribution of arterial calcifications if present, as noted below; LM 0 LAD 0 LCx 0 RCA 0 Total 0 IMPRESSION AND RECOMMENDATION: 1. Coronary calcium score of 0.  Patient is low risk. 2. CAC 0, CAC-DRS A0. 3. Continue heart healthy lifestyle and risk factor modification. Electronically Signed: By: Debbe Odea M.D. On: 01/06/2023 12:52       Assessment & Plan:  Healthcare maintenance Assessment & Plan: Physical today 04/22/23.  Colonoscopy 11/2020 as outlined.  PSA 07/05/22 - .5.    Hypercholesterolemia Assessment & Plan: On crestor.  Low cholesterol diet and exercise.  Follow lipid panel and liver function tests.    Orders: -     Hepatic function panel -     Lipid panel  Essential hypertension Assessment & Plan: Continue lisinopril and hctz and now taking amlodipine regularly.  Pressures as outlined.  Elevated above goal.  Have him spot check his pressure and send in readings over the next 3-4 weeks.  If persistent elevation, will need medication adjustment. Follow metabolic panel.    Type 2 diabetes mellitus with hyperglycemia, without long-term current use of insulin (HCC) Assessment & Plan: On ozempic and metformin.  Sugars as outlined. Discussed diet and exercise.  Follow met b  and a1c.    Orders: -     Basic metabolic panel -     Hemoglobin A1c  Vitamin  D deficiency Assessment & Plan: Follow vitamin D level.   Orders: -     VITAMIN D 25 Hydroxy (Vit-D Deficiency, Fractures)  Secondary diabetes with peripheral circulatory disorder (HCC) Assessment & Plan: Low carb diet and exercise.  On ozempic and metformin.  Sugars improved.  Follow met b and a1c.  Lower extremity swelling improved.       Lymphedema Assessment & Plan: Compression hose.  Leg elevation.  Overall improved.    History of colonic polyps Assessment & Plan: Colonoscopy 12/12/20 - polyp hepatic flexure, polyp - rectum and internal hemorrhoids. Recommended f/u in 7 years.        Dale [place], MD

## 2023-04-24 ENCOUNTER — Encounter: Payer: Self-pay | Admitting: Internal Medicine

## 2023-04-24 NOTE — Assessment & Plan Note (Signed)
Colonoscopy 12/12/20 - polyp hepatic flexure, polyp - rectum and internal hemorrhoids. Recommended f/u in 7 years.   

## 2023-04-24 NOTE — Assessment & Plan Note (Signed)
Low carb diet and exercise.  On ozempic and metformin.  Sugars improved.  Follow met b and a1c.  Lower extremity swelling improved.     

## 2023-04-24 NOTE — Assessment & Plan Note (Signed)
Follow vitamin D level.  

## 2023-04-24 NOTE — Assessment & Plan Note (Signed)
Compression hose.  Leg elevation.  Overall improved.

## 2023-04-24 NOTE — Assessment & Plan Note (Signed)
Continue lisinopril and hctz and now taking amlodipine regularly.  Pressures as outlined.  Elevated above goal.  Have him spot check his pressure and send in readings over the next 3-4 weeks.  If persistent elevation, will need medication adjustment. Follow metabolic panel.

## 2023-04-24 NOTE — Assessment & Plan Note (Signed)
On ozempic and metformin.  Sugars as outlined. Discussed diet and exercise.  Follow met b and a1c.

## 2023-04-24 NOTE — Assessment & Plan Note (Signed)
On crestor.  Low cholesterol diet and exercise.  Follow lipid panel and liver function tests.   

## 2023-05-02 ENCOUNTER — Encounter: Payer: Self-pay | Admitting: Internal Medicine

## 2023-05-03 ENCOUNTER — Other Ambulatory Visit: Payer: Self-pay

## 2023-05-03 MED ORDER — AMLODIPINE BESYLATE 5 MG PO TABS: ORAL_TABLET | ORAL | 2 refills | Status: DC

## 2023-05-03 NOTE — Telephone Encounter (Signed)
Patient called, mediation sent to wrong pharmacy. It should be sent to [facility] [employer], In [place].

## 2023-05-12 ENCOUNTER — Encounter: Payer: Self-pay | Admitting: Pharmacist

## 2023-05-12 ENCOUNTER — Other Ambulatory Visit: Payer: Managed Care, Other (non HMO) | Admitting: Pharmacist

## 2023-05-17 ENCOUNTER — Ambulatory Visit: Payer: Managed Care, Other (non HMO)

## 2023-05-17 DIAGNOSIS — G4719 Other hypersomnia: Secondary | ICD-10-CM

## 2023-05-17 DIAGNOSIS — G4733 Obstructive sleep apnea (adult) (pediatric): Secondary | ICD-10-CM

## 2023-05-18 ENCOUNTER — Telehealth: Payer: Self-pay

## 2023-05-18 NOTE — Telephone Encounter (Signed)
Lm x1 for patient.  

## 2023-05-18 NOTE — Telephone Encounter (Signed)
-----   Message from Erin Fulling, MD sent at 05/17/2023  5:40 PM EDT ----- DX of  OSA  Start AUTO CPAP 5-15 cm h20 ----- Message ----- From: Iris Pert Sent: 05/17/2023   4:50 PM EDT To: Erin Fulling, MD

## 2023-05-19 NOTE — Telephone Encounter (Signed)
Spoke to patient and relayed below message/recommendations.  He would like to do some research and call back with update.

## 2023-05-24 NOTE — Telephone Encounter (Signed)
Lm for pt

## 2023-05-25 NOTE — Telephone Encounter (Signed)
Lm x2 for pt to request update.  Will close encounter per office protocol.

## 2023-06-06 ENCOUNTER — Encounter: Payer: Self-pay | Admitting: Podiatry

## 2023-06-06 ENCOUNTER — Ambulatory Visit: Payer: Managed Care, Other (non HMO) | Admitting: Podiatry

## 2023-06-06 VITALS — BP 153/100 | HR 82

## 2023-06-06 DIAGNOSIS — E1151 Type 2 diabetes mellitus with diabetic peripheral angiopathy without gangrene: Secondary | ICD-10-CM

## 2023-06-06 DIAGNOSIS — B351 Tinea unguium: Secondary | ICD-10-CM | POA: Diagnosis not present

## 2023-06-06 DIAGNOSIS — M79674 Pain in right toe(s): Secondary | ICD-10-CM | POA: Diagnosis not present

## 2023-06-06 DIAGNOSIS — M79675 Pain in left toe(s): Secondary | ICD-10-CM

## 2023-06-06 NOTE — Progress Notes (Signed)
  Subjective:  [name], male    DOB: [birth year],  MRN: [id number]  Marzetta Board Cuthbert presents to clinic today for at risk foot care. Pt has h/o NIDDM with PAD and painful elongated mycotic toenails 1-5 bilaterally which are tender when wearing enclosed shoe gear. Pain is relieved with periodic professional debridement.  Chief Complaint  Patient presents with   Diabetes    "I'm Diabetic.  I just get my toenails done."  Dr. Dale Marlton - will see her on Thursday, glucose 149 mg/dl - today,    New problem(s): None.   PCP is Dale [place], MD.  No Known Allergies  Review of Systems: Negative except as noted in the HPI.  Objective: No changes noted in today's physical examination. Vitals:   06/06/23 1327  BP: (!) 153/100  Pulse: 30   [name] is a pleasant 62 y.o. male morbidly obese in NAD. AAO x 3.  Vascular Examination: CFT <3 seconds b/l. DP/PT pulses faintly palpable b/l. Skin temperature gradient warm to warm b/l. No pain with calf compression. No ischemia or gangrene. No cyanosis or clubbing noted b/l. Lymphedema present BLE.   Neurological Examination: Sensation grossly intact b/l with 10 gram monofilament.   Dermatological Examination: Pedal skin warm and supple b/l.   No open wounds. No interdigital macerations.  Toenails 1-5 b/l thick, discolored, elongated with subungual debris and pain on dorsal palpation.    No hyperkeratotic nor porokeratotic lesions present on today's visit.  Musculoskeletal Examination: Normal muscle strength 5/5 to all lower extremity muscle groups bilaterally. No pain, crepitus or joint limitation noted with ROM b/l LE. No gross bony pedal deformities b/l. Patient ambulates independently without assistive aids.  Radiographs: None  Last A1c:      Latest Ref Rng & Units 06/08/2023    8:09 AM 12/22/2022    7:31 AM 07/05/2022    7:23 AM 06/17/2022   12:00 AM  Hemoglobin A1C  Hemoglobin-A1c 4.8 - 5.6 % 7.1  7.0   6.6  6.4         This result is from an external source.   Assessment/Plan: 1. Pain due to onychomycosis of toenails of both feet   2. Type II diabetes mellitus with peripheral circulatory disorder [facility])     -Patient was evaluated and treated. All patient's and/or POA's questions/concerns answered on today's visit. -Continue foot and shoe inspections daily. Monitor blood glucose per PCP/Endocrinologist's recommendations. -Patient to continue soft, supportive shoe gear daily. -Toenails 1-5 b/l were debrided in length and girth with sterile nail nippers and dremel without iatrogenic bleeding.  -Patient/POA to call should there be question/concern in the interim.   Return in about 3 months (around 09/06/2023).  Freddie Breech, DPM

## 2023-06-09 ENCOUNTER — Ambulatory Visit
Admission: RE | Admit: 2023-06-09 | Discharge: 2023-06-09 | Disposition: A | Discharge status: Home / Self Care | Payer: Managed Care, Other (non HMO) | Attending: Internal Medicine | Admitting: Internal Medicine

## 2023-06-09 ENCOUNTER — Ambulatory Visit
Admission: RE | Admit: 2023-06-09 | Discharge: 2023-06-09 | Disposition: A | Discharge status: Home / Self Care | Payer: Managed Care, Other (non HMO) | Source: Ambulatory Visit | Attending: Internal Medicine | Admitting: Internal Medicine

## 2023-06-09 ENCOUNTER — Ambulatory Visit: Payer: Managed Care, Other (non HMO) | Admitting: Internal Medicine

## 2023-06-09 VITALS — BP 130/78 | HR 76 | Temp 97.9°F | Resp 16 | Ht 73.0 in | Wt 282.0 lb

## 2023-06-09 DIAGNOSIS — M549 Dorsalgia, unspecified: Secondary | ICD-10-CM | POA: Insufficient documentation

## 2023-06-09 DIAGNOSIS — D649 Anemia, unspecified: Secondary | ICD-10-CM

## 2023-06-09 DIAGNOSIS — I872 Venous insufficiency (chronic) (peripheral): Secondary | ICD-10-CM | POA: Diagnosis not present

## 2023-06-09 DIAGNOSIS — M5489 Other dorsalgia: Secondary | ICD-10-CM

## 2023-06-09 DIAGNOSIS — Z800 Family history of malignant neoplasm of digestive organs: Secondary | ICD-10-CM

## 2023-06-09 DIAGNOSIS — E7800 Pure hypercholesterolemia, unspecified: Secondary | ICD-10-CM

## 2023-06-09 DIAGNOSIS — R109 Unspecified abdominal pain: Secondary | ICD-10-CM

## 2023-06-09 DIAGNOSIS — E1351 Other specified diabetes mellitus with diabetic peripheral angiopathy without gangrene: Secondary | ICD-10-CM

## 2023-06-09 DIAGNOSIS — I10 Essential (primary) hypertension: Secondary | ICD-10-CM

## 2023-06-09 DIAGNOSIS — E1151 Type 2 diabetes mellitus with diabetic peripheral angiopathy without gangrene: Secondary | ICD-10-CM

## 2023-06-09 DIAGNOSIS — Z7984 Long term (current) use of oral hypoglycemic drugs: Secondary | ICD-10-CM

## 2023-06-09 DIAGNOSIS — Z7985 Long-term (current) use of injectable non-insulin antidiabetic drugs: Secondary | ICD-10-CM

## 2023-06-09 DIAGNOSIS — E1165 Type 2 diabetes mellitus with hyperglycemia: Secondary | ICD-10-CM

## 2023-06-09 LAB — BASIC METABOLIC PANEL
BUN/Creatinine Ratio: 21 (ref 10–24)
BUN: 18 mg/dL (ref 8–27)
CO2: 28 mmol/L (ref 20–29)
Calcium: 9.9 mg/dL (ref 8.6–10.2)
Chloride: 96 mmol/L (ref 96–106)
Creatinine, Ser: 0.87 mg/dL (ref 0.76–1.27)
Glucose: 142 mg/dL — ABNORMAL HIGH (ref 70–99)
Potassium: 3.4 mmol/L — ABNORMAL LOW (ref 3.5–5.2)
Sodium: 139 mmol/L (ref 134–144)
eGFR: 98 mL/min/{1.73_m2} (ref >59)

## 2023-06-09 LAB — LIPID PANEL
Chol/HDL Ratio: 3.5 ratio (ref 0.0–5.0)
Cholesterol, Total: 128 mg/dL (ref 100–199)
HDL: 37 mg/dL — ABNORMAL LOW (ref >39)
LDL Chol Calc (NIH): 70 mg/dL (ref 0–99)
Triglycerides: 112 mg/dL (ref 0–149)
VLDL Cholesterol Cal: 21 mg/dL (ref 5–40)

## 2023-06-09 LAB — HEPATIC FUNCTION PANEL
ALT: 17 IU/L (ref 0–44)
AST: 14 IU/L (ref 0–40)
Albumin: 3.9 g/dL (ref 3.9–4.9)
Alkaline Phosphatase: 94 IU/L (ref 44–121)
Bilirubin Total: 0.4 mg/dL (ref 0.0–1.2)
Bilirubin, Direct: 0.11 mg/dL (ref 0.00–0.40)
Total Protein: 7 g/dL (ref 6.0–8.5)

## 2023-06-09 LAB — HEMOGLOBIN A1C
Est. average glucose Bld gHb Est-mCnc: 157 mg/dL
Hgb A1c MFr Bld: 7.1 % — ABNORMAL HIGH (ref 4.8–5.6)

## 2023-06-09 LAB — VITAMIN D 25 HYDROXY (VIT D DEFICIENCY, FRACTURES): Vit D, 25-Hydroxy: 16.8 ng/mL — ABNORMAL LOW (ref 30.0–100.0)

## 2023-06-09 NOTE — Progress Notes (Unsigned)
Subjective:    [name], male    DOB: 1961-08-06, 62 y.o.   MRN: [id number]  Patient here for  Chief Complaint  Patient presents with   Back Pain   Abdominal Pain    HPI Here as a work in with concerns regarding low back pain. Also recently diagnosed with OSA.  Recommended cpap.    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)    Pt states controlled by diet for last 4 years   Depression    Environmental allergies    Family history of colon cancer    GERD (gastroesophageal reflux disease)    History of chicken pox    Hyperlipidemia    Hypertension    Venous stasis    Vitamin D deficiency    Past Surgical History:  Procedure Laterality Date   CHOLECYSTECTOMY  2002   COLONOSCOPY WITH PROPOFOL N/A 06/27/2015   Procedure: COLONOSCOPY WITH PROPOFOL;  Surgeon: Christena Deem, MD;  Location: [facility] ENDOSCOPY;  Service: Endoscopy;  Laterality: N/A;   COLONOSCOPY WITH PROPOFOL N/A 12/12/2020   Procedure: COLONOSCOPY WITH PROPOFOL;  Surgeon: Regis Bill, MD;  Location: [facility] ENDOSCOPY;  Service: Endoscopy;  Laterality: N/A;   HERNIA REPAIR  1962   Family History  Problem Relation Age of Onset   Stomach cancer Mother    Arthritis Mother    Diabetes Mother    Hypertension Mother    Hyperlipidemia Father    Heart disease Father    Diabetes Father    Hypertension Father    Arthritis Maternal Grandmother    Hyperlipidemia Maternal Grandmother    Hypertension Maternal Grandmother    Arthritis Maternal Grandfather    Hyperlipidemia Maternal Grandfather    Heart disease Maternal Grandfather    Hypertension Maternal Grandfather    Arthritis Paternal Grandmother    Breast cancer Paternal Grandmother    Hypertension Paternal Grandmother    Arthritis Paternal Grandfather    Hyperlipidemia Paternal Grandfather    Heart disease Paternal Grandfather    Diabetes Paternal Grandfather     Hypertension Paternal Grandfather    Diabetes Sister    Social History   Socioeconomic History   Marital status: Widowed    Spouse name: Not on file   Number of children: Not on file   Years of education: Not on file   Highest education level: Not on file  Occupational History   Not on file  Tobacco Use   Smoking status: Never   Smokeless tobacco: Never  Vaping Use   Vaping status: Never Used  Substance and Sexual Activity   Alcohol use: Yes    Alcohol/week: 0.0 - 1.0 standard drinks of alcohol    Comment: 1 drink every 2 months   Drug use: No   Sexual activity: Not on file  Other Topics Concern   Not on file  Social History Narrative   Not on file   Social Determinants of Health   Financial Resource Strain: Low Risk  (11/20/2021)   Overall Financial Resource Strain (CARDIA)    Difficulty of Paying Living Expenses: Not hard at all  Food Insecurity: Not on file  Transportation Needs: Not on file  Physical Activity: Inactive (05/29/2020)   Exercise Vital Sign    Days of Exercise per Week: 0 days    Minutes of Exercise per Session: 0 min  Stress: Not on file  Social Connections: Not on file  Review of Systems     Objective:     BP 130/78   Pulse 76   Temp 97.9 F (36.6 C)   Resp 16   Ht 6\' 1"  (1.854 m)   Wt 282 lb (127.9 kg)   SpO2 98%   BMI 37.21 kg/m  Wt Readings from Last 3 Encounters:  06/09/23 282 lb (127.9 kg)  04/22/23 288 lb (130.6 kg)  03/10/23 287 lb (130.2 kg)    Physical Exam   Outpatient Encounter Medications as of 06/09/2023  Medication Sig   amLODipine (NORVASC) 5 MG tablet TAKE 1 TABLET(5 MG) BY MOUTH DAILY   cholecalciferol (VITAMIN D) 1000 units tablet Take 1,000 Units by mouth daily.   Continuous Blood Gluc Sensor (DEXCOM G7 SENSOR) MISC APPLY 1 SENSOR TOPICALLY AS DIRECTED   hydrochlorothiazide (HYDRODIURIL) 25 MG tablet TAKE 1 TABLET(25 MG) BY MOUTH DAILY   lisinopril (ZESTRIL) 40 MG tablet TAKE 1 TABLET BY MOUTH  DAILY    loratadine (CLARITIN) 10 MG tablet Take 10 mg by mouth daily as needed for allergies.    metFORMIN (GLUCOPHAGE) 500 MG tablet Take 2 tablets (1,000 mg total) by mouth daily.   OZEMPIC, 1 MG/DOSE, 4 MG/3ML SOPN INJECT 1 MG UNDER THE SKIN ONCE A WEEK   rosuvastatin (CRESTOR) 10 MG tablet TAKE 1 TABLET BY MOUTH  DAILY   No facility-administered encounter medications on file as of 06/09/2023.     Lab Results  Component Value Date   WBC 7.5 12/22/2022   HGB 13.8 12/22/2022   HCT 41.7 12/22/2022   PLT 297 12/22/2022   GLUCOSE 142 (H) 06/08/2023   CHOL 128 06/08/2023   TRIG 112 06/08/2023   HDL 37 (L) 06/08/2023   LDLCALC 70 06/08/2023   ALT 17 06/08/2023   AST 14 06/08/2023   NA 139 06/08/2023   K 3.4 (L) 06/08/2023   CL 96 06/08/2023   CREATININE 0.87 06/08/2023   BUN 18 06/08/2023   CO2 28 06/08/2023   TSH 1.400 12/22/2022   HGBA1C 7.1 (H) 06/08/2023    CT CARDIAC SCORING  Addendum Date: 01/10/2023   ADDENDUM REPORT: 01/10/2023 10:40 EXAM: OVER-READ INTERPRETATION  CT CHEST The following report is an over-read performed by radiologist Dr. Maryelizabeth Rowan [facility] on 01/10/2023. This over-read does not include interpretation of cardiac or coronary anatomy or pathology. The calcium score interpretation by the cardiologist is attached. COMPARISON:  None. FINDINGS: Limited view of the lung parenchyma demonstrates no suspicious nodularity. Airways are normal. Limited view of the mediastinum demonstrates no adenopathy. Esophagus normal. Limited view of the upper abdomen unremarkable. Limited view of the skeleton and chest wall is unremarkable. IMPRESSION: No significant extracardiac findings. Electronically Signed   By: Genevive Bi M.D.   On: 01/10/2023 10:40   Result Date: 01/10/2023 CLINICAL DATA:  Risk stratification EXAM: Coronary Calcium Score TECHNIQUE: The patient was scanned on a Siemens Somatom go.Top Scanner. Axial non-contrast 3 mm slices were carried out  through the heart. The data set was analyzed on a dedicated work station and scored using the Agatson method. FINDINGS: Non-cardiac: See separate report from [facility]. Ascending Aorta: Normal size Pericardium: Normal Coronary arteries: Normal origin of left and right coronary arteries. Distribution of arterial calcifications if present, as noted below; LM 0 LAD 0 LCx 0 RCA 0 Total 0 IMPRESSION AND RECOMMENDATION: 1. Coronary calcium score of 0.  Patient is low risk. 2. CAC 0, CAC-DRS A0. 3. Continue heart healthy lifestyle and risk factor modification. Electronically  Signed: By: Debbe Odea M.D. On: 01/06/2023 12:52       Assessment & Plan:  Abdominal pain, unspecified abdominal location -     CBC with Differential/Platelet -     Urinalysis, Routine w reflex microscopic -     Urine Culture -     DG Abd 1 View; Future  Midline back pain, unspecified back location, unspecified chronicity -     DG Lumbar Spine 2-3 Views; Future -     DG Thoracic Spine 2 View; Future     Dale [place], MD

## 2023-06-10 LAB — URINALYSIS, ROUTINE W REFLEX MICROSCOPIC
Bilirubin, UA: NEGATIVE
Glucose, UA: NEGATIVE
Ketones, UA: NEGATIVE
Leukocytes,UA: NEGATIVE
Nitrite, UA: NEGATIVE
Protein,UA: NEGATIVE
RBC, UA: NEGATIVE
Specific Gravity, UA: 1.021 (ref 1.005–1.030)
Urobilinogen, Ur: 1 mg/dL (ref 0.2–1.0)
pH, UA: 6.5 (ref 5.0–7.5)

## 2023-06-10 LAB — CBC WITH DIFFERENTIAL/PLATELET
Basophils Absolute: 0.1 10*3/uL (ref 0.0–0.2)
Basos: 1 %
EOS (ABSOLUTE): 0.1 10*3/uL (ref 0.0–0.4)
Eos: 1 %
Hematocrit: 41.9 % (ref 37.5–51.0)
Hemoglobin: 13.9 g/dL (ref 13.0–17.7)
Immature Grans (Abs): 0 10*3/uL (ref 0.0–0.1)
Immature Granulocytes: 0 %
Lymphocytes Absolute: 1.9 10*3/uL (ref 0.7–3.1)
Lymphs: 21 %
MCH: 28.8 pg (ref 26.6–33.0)
MCHC: 33.2 g/dL (ref 31.5–35.7)
MCV: 87 fL (ref 79–97)
Monocytes Absolute: 0.7 10*3/uL (ref 0.1–0.9)
Monocytes: 8 %
Neutrophils Absolute: 6.5 10*3/uL (ref 1.4–7.0)
Neutrophils: 69 %
Platelets: 340 10*3/uL (ref 150–450)
RBC: 4.83 x10E6/uL (ref 4.14–5.80)
RDW: 13.2 % (ref 11.6–15.4)
WBC: 9.3 10*3/uL (ref 3.4–10.8)

## 2023-06-11 LAB — URINE CULTURE

## 2023-06-12 ENCOUNTER — Encounter: Payer: Self-pay | Admitting: Internal Medicine

## 2023-06-12 NOTE — Assessment & Plan Note (Signed)
Continue lisinopril and hctz and amlodipine regularly.  Pressures as outlined. Follow metabolic panel.

## 2023-06-12 NOTE — Assessment & Plan Note (Signed)
On ozempic and metformin.  Discussed diet and exercise.  Follow met b and a1c.

## 2023-06-12 NOTE — Assessment & Plan Note (Signed)
Colonoscopy 11/2020.  Per report, recommended f/u in 7 years.

## 2023-06-12 NOTE — Assessment & Plan Note (Signed)
Low carb diet and exercise.  On ozempic and metformin.  Sugars improved.  Follow met b and a1c.  Lower extremity swelling improved.     

## 2023-06-12 NOTE — Assessment & Plan Note (Signed)
Increased back pain as outlined.  Symptoms worse on exam with going from sitting to lying position and lying to sitting position.  No pain - SLR.  Tylenol for pain.  Check L- S spine xray and thoracic spine xray.  Further w/up pending results.

## 2023-06-12 NOTE — Assessment & Plan Note (Signed)
Check cbc 

## 2023-06-12 NOTE — Assessment & Plan Note (Signed)
Continue compression hose.  Continue leg elevation.  Swelling improved.  Skin - improved.  Follow.  

## 2023-06-12 NOTE — Assessment & Plan Note (Signed)
Increased abdominal pain as outlined.  No significant pain to palpation.  Rectal exam - increased stool.  Check cbc and urine.  Recent liver panel and met b - ok.  Check KUB.  Discussed possible etiologies of his pain.  Enema as directed.  Follow.  Call with update.  CT scan if persistent pain.

## 2023-06-12 NOTE — Assessment & Plan Note (Signed)
On crestor.  Low cholesterol diet and exercise.  Follow lipid panel and liver function tests.

## 2023-06-15 ENCOUNTER — Encounter: Payer: Self-pay | Admitting: Internal Medicine

## 2023-06-16 NOTE — Telephone Encounter (Signed)
Called patient for more info. Patient confirmed no abd pain. He did a second enema and a laxative over the weekend- little to no results. He did another enema last night, increased his water intake and did some walking. This helped. Had large bowel movement last night. Feeling better today. Should he take something to keep him regular? He has a follow up with you 7/31.

## 2023-06-16 NOTE — Telephone Encounter (Signed)
Recommend starting mirolax daily - since had a good bowel movement last night.  Continue to stay hydrated.  Increased walking and activity.

## 2023-06-22 ENCOUNTER — Telehealth: Payer: Self-pay

## 2023-06-22 ENCOUNTER — Ambulatory Visit: Payer: Managed Care, Other (non HMO) | Admitting: Internal Medicine

## 2023-06-22 ENCOUNTER — Encounter: Payer: Self-pay | Admitting: Internal Medicine

## 2023-06-22 VITALS — BP 126/72 | HR 89 | Temp 97.9°F | Resp 16 | Ht 73.0 in | Wt 279.0 lb

## 2023-06-22 DIAGNOSIS — I10 Essential (primary) hypertension: Secondary | ICD-10-CM

## 2023-06-22 DIAGNOSIS — Z7985 Long-term (current) use of injectable non-insulin antidiabetic drugs: Secondary | ICD-10-CM | POA: Diagnosis not present

## 2023-06-22 DIAGNOSIS — I872 Venous insufficiency (chronic) (peripheral): Secondary | ICD-10-CM | POA: Diagnosis not present

## 2023-06-22 DIAGNOSIS — E7800 Pure hypercholesterolemia, unspecified: Secondary | ICD-10-CM

## 2023-06-22 DIAGNOSIS — Z800 Family history of malignant neoplasm of digestive organs: Secondary | ICD-10-CM

## 2023-06-22 DIAGNOSIS — E1351 Other specified diabetes mellitus with diabetic peripheral angiopathy without gangrene: Secondary | ICD-10-CM

## 2023-06-22 DIAGNOSIS — R600 Localized edema: Secondary | ICD-10-CM

## 2023-06-22 DIAGNOSIS — E1165 Type 2 diabetes mellitus with hyperglycemia: Secondary | ICD-10-CM | POA: Diagnosis not present

## 2023-06-22 MED ORDER — OZEMPIC (1 MG/DOSE) 4 MG/3ML ~~LOC~~ SOPN
PEN_INJECTOR | SUBCUTANEOUS | 1 refills | Status: DC
Start: 2023-06-22 — End: 2023-06-22

## 2023-06-22 MED ORDER — METFORMIN HCL 500 MG PO TABS: ORAL_TABLET | ORAL | 1 refills | Status: DC

## 2023-06-22 MED ORDER — LISINOPRIL 40 MG PO TABS: 40.0000 mg | ORAL_TABLET | Freq: Every day | ORAL | 3 refills | Status: DC

## 2023-06-22 MED ORDER — OZEMPIC (1 MG/DOSE) 4 MG/3ML ~~LOC~~ SOPN
PEN_INJECTOR | SUBCUTANEOUS | 1 refills | Status: DC
Start: 2023-06-22 — End: 2024-01-16

## 2023-06-22 MED ORDER — ROSUVASTATIN CALCIUM 10 MG PO TABS: 10.0000 mg | ORAL_TABLET | Freq: Every day | ORAL | 3 refills | Status: DC

## 2023-06-22 NOTE — Telephone Encounter (Signed)
Patient states at check-out today that he would like to have his labs drawn at [facility] for his 77-month follow-up with Dr. Dale Hamilton.

## 2023-06-22 NOTE — Progress Notes (Signed)
Subjective:    Patient ID: Jolly Mango, male    DOB: Apr 04, 1961, 61 y.o.   MRN: [id number]  Patient here for  Chief Complaint  Patient presents with   Medical Management of Chronic Issues    HPI Here for a scheduled follow up.  Here to follow up regarding diabetes and hypertension.  Was recently seen for abdominal pain. Xray revealed large stool burden in colon and back xray reveal compression deformity L1.  Has used enemas.  Bowels are moving now.  Discussed benefiber to keep bowels moving. Abdominal pain is much better now that bowels are moving.  No abdominal pain now. Breathing stable.  Last A1c 7.1.  low carb diet and exercise.  Discussed adjusting metformin.  Increase dose. Swelling is better.    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)    Pt states controlled by diet for last 4 years   Depression    Environmental allergies    Family history of colon cancer    GERD (gastroesophageal reflux disease)    History of chicken pox    Hyperlipidemia    Hypertension    Venous stasis    Vitamin D deficiency    Past Surgical History:  Procedure Laterality Date   CHOLECYSTECTOMY  2002   COLONOSCOPY WITH PROPOFOL N/A 06/27/2015   Procedure: COLONOSCOPY WITH PROPOFOL;  Surgeon: Christena Deem, MD;  Location: [facility] ENDOSCOPY;  Service: Endoscopy;  Laterality: N/A;   COLONOSCOPY WITH PROPOFOL N/A 12/12/2020   Procedure: COLONOSCOPY WITH PROPOFOL;  Surgeon: Regis Bill, MD;  Location: [facility] ENDOSCOPY;  Service: Endoscopy;  Laterality: N/A;   HERNIA REPAIR  1962   Family History  Problem Relation Age of Onset   Stomach cancer Mother    Arthritis Mother    Diabetes Mother    Hypertension Mother    Hyperlipidemia Father    Heart disease Father    Diabetes Father    Hypertension Father    Arthritis Maternal Grandmother    Hyperlipidemia Maternal Grandmother    Hypertension Maternal Grandmother     Arthritis Maternal Grandfather    Hyperlipidemia Maternal Grandfather    Heart disease Maternal Grandfather    Hypertension Maternal Grandfather    Arthritis Paternal Grandmother    Breast cancer Paternal Grandmother    Hypertension Paternal Grandmother    Arthritis Paternal Grandfather    Hyperlipidemia Paternal Grandfather    Heart disease Paternal Grandfather    Diabetes Paternal Grandfather    Hypertension Paternal Grandfather    Diabetes Sister    Social History   Socioeconomic History   Marital status: Widowed    Spouse name: Not on file   Number of children: Not on file   Years of education: Not on file   Highest education level: Not on file  Occupational History   Not on file  Tobacco Use   Smoking status: Never   Smokeless tobacco: Never  Vaping Use   Vaping status: Never Used  Substance and Sexual Activity   Alcohol use: Yes    Alcohol/week: 0.0 - 1.0 standard drinks of alcohol    Comment: 1 drink every 2 months   Drug use: No   Sexual activity: Not on file  Other Topics Concern   Not on file  Social History Narrative   Not on file   Social Determinants of Health   Financial Resource Strain: Low Risk  (11/20/2021)   Overall Financial Resource Strain (CARDIA)  Difficulty of Paying Living Expenses: Not hard at all  Food Insecurity: Not on file  Transportation Needs: Not on file  Physical Activity: Inactive (05/29/2020)   Exercise Vital Sign    Days of Exercise per Week: 0 days    Minutes of Exercise per Session: 0 min  Stress: Not on file  Social Connections: Not on file     Review of Systems  Constitutional:  Negative for appetite change and unexpected weight change.  HENT:  Negative for congestion and sinus pressure.   Respiratory:  Negative for cough, chest tightness and shortness of breath.   Cardiovascular:  Negative for chest pain and palpitations.       Swelling - better.   Gastrointestinal:  Negative for abdominal pain, diarrhea, nausea  and vomiting.  Genitourinary:  Negative for difficulty urinating and dysuria.  Musculoskeletal:  Negative for joint swelling and myalgias.  Skin:  Negative for color change and rash.  Neurological:  Negative for dizziness and headaches.  Psychiatric/Behavioral:  Negative for agitation and dysphoric mood.        Objective:     BP 126/72   Pulse 89   Temp 97.9 F (36.6 C)   Resp 16   Ht 6\' 1"  (1.854 m)   Wt 279 lb (126.6 kg)   SpO2 98%   BMI 36.81 kg/m  Wt Readings from Last 3 Encounters:  06/22/23 279 lb (126.6 kg)  06/09/23 282 lb (127.9 kg)  04/22/23 288 lb (130.6 kg)    Physical Exam Vitals reviewed.  Constitutional:      General: He is not in acute distress.    Appearance: Normal appearance. He is well-developed.  HENT:     Head: Normocephalic and atraumatic.     Right Ear: External ear normal.     Left Ear: External ear normal.  Eyes:     General: No scleral icterus.       Right eye: No discharge.        Left eye: No discharge.     Conjunctiva/sclera: Conjunctivae normal.  Cardiovascular:     Rate and Rhythm: Normal rate and regular rhythm.  Pulmonary:     Effort: Pulmonary effort is normal. No respiratory distress.     Breath sounds: Normal breath sounds.  Abdominal:     General: Bowel sounds are normal.     Palpations: Abdomen is soft.     Tenderness: There is no abdominal tenderness.  Musculoskeletal:        General: No tenderness.     Cervical back: Neck supple. No tenderness.     Comments: Pedal and lower extremity swelling - improved.  Venostasis changes.   Lymphadenopathy:     Cervical: No cervical adenopathy.  Skin:    Findings: No erythema or rash.  Neurological:     Mental Status: He is alert.  Psychiatric:        Mood and Affect: Mood normal.        Behavior: Behavior normal.      Outpatient Encounter Medications as of 06/22/2023  Medication Sig   amLODipine (NORVASC) 5 MG tablet TAKE 1 TABLET(5 MG) BY MOUTH DAILY   cholecalciferol  (VITAMIN D) 1000 units tablet Take 1,000 Units by mouth daily.   Continuous Blood Gluc Sensor (DEXCOM G7 SENSOR) MISC APPLY 1 SENSOR TOPICALLY AS DIRECTED   hydrochlorothiazide (HYDRODIURIL) 25 MG tablet TAKE 1 TABLET(25 MG) BY MOUTH DAILY   loratadine (CLARITIN) 10 MG tablet Take 10 mg by mouth daily as needed for allergies.    [  DISCONTINUED] lisinopril (ZESTRIL) 40 MG tablet TAKE 1 TABLET BY MOUTH  DAILY   [DISCONTINUED] metFORMIN (GLUCOPHAGE) 500 MG tablet Take 2 tablets (1,000 mg total) by mouth daily.   [DISCONTINUED] OZEMPIC, 1 MG/DOSE, 4 MG/3ML SOPN INJECT 1 MG UNDER THE SKIN ONCE A WEEK   [DISCONTINUED] rosuvastatin (CRESTOR) 10 MG tablet TAKE 1 TABLET BY MOUTH  DAILY   lisinopril (ZESTRIL) 40 MG tablet Take 1 tablet (40 mg total) by mouth daily.   metFORMIN (GLUCOPHAGE) 500 MG tablet Take one tablet in am with breakfast and two tablets with evening meal.   rosuvastatin (CRESTOR) 10 MG tablet Take 1 tablet (10 mg total) by mouth daily.   Semaglutide, 1 MG/DOSE, (OZEMPIC, 1 MG/DOSE,) 4 MG/3ML SOPN INJECT 1 MG UNDER THE SKIN ONCE A WEEK   [DISCONTINUED] lisinopril (ZESTRIL) 40 MG tablet Take 1 tablet (40 mg total) by mouth daily.   [DISCONTINUED] metFORMIN (GLUCOPHAGE) 500 MG tablet Take one tablet in am with breakfast and two tablets with evening meal.   [DISCONTINUED] rosuvastatin (CRESTOR) 10 MG tablet Take 1 tablet (10 mg total) by mouth daily.   [DISCONTINUED] Semaglutide, 1 MG/DOSE, (OZEMPIC, 1 MG/DOSE,) 4 MG/3ML SOPN INJECT 1 MG UNDER THE SKIN ONCE A WEEK   No facility-administered encounter medications on file as of 06/22/2023.     Lab Results  Component Value Date   WBC 9.3 06/09/2023   HGB 13.9 06/09/2023   HCT 41.9 06/09/2023   PLT 340 06/09/2023   GLUCOSE 142 (H) 06/08/2023   CHOL 128 06/08/2023   TRIG 112 06/08/2023   HDL 37 (L) 06/08/2023   LDLCALC 70 06/08/2023   ALT 17 06/08/2023   AST 14 06/08/2023   NA 139 06/08/2023   K 3.4 (L) 06/08/2023   CL 96 06/08/2023    CREATININE 0.87 06/08/2023   BUN 18 06/08/2023   CO2 28 06/08/2023   TSH 1.400 12/22/2022   HGBA1C 7.1 (H) 06/08/2023    DG Abd 1 View  Result Date: 06/09/2023 CLINICAL DATA:  Abdominal pain EXAM: ABDOMEN - 1 VIEW COMPARISON:  None Available. FINDINGS: Bowel gas pattern is nonspecific. Moderate to large amount of stool is seen in colon, mostly in the ascending colon. There is no small bowel dilation. Surgical clips are seen in gallbladder fossa. Degenerative changes are noted in thoracic and lumbar spine. No abnormal masses or calcifications are seen. Kidneys are partly obscured by bowel contents. IMPRESSION: Nonspecific bowel gas pattern. Moderate to large stool burden in colon, more so in ascending colon. Electronically Signed   By: Ernie Avena M.D.   On: 06/09/2023 14:41   DG Lumbar Spine 2-3 Views  Result Date: 06/09/2023 CLINICAL DATA:  Back pain, no recent trauma. EXAM: LUMBAR SPINE - 2-3 VIEW COMPARISON:  None Available. FINDINGS: There is no evidence of lumbar spine fracture. Mild anterior vertebral body height loss suggesting mild compression deformity of L1. Straightening of the lumbar spine. Mild multilevel degenerate disc disease. IMPRESSION: Mild anterior vertebral body height loss suggesting mild compression deformity of L1, age indeterminate. 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 Electronically Signed   By: Larose Hires D.O.   On: 06/09/2023 14:38   DG Thoracic Spine 2 View  Result Date: 06/09/2023 CLINICAL DATA:  Increased back pain.  No injury. EXAM: THORACIC SPINE 2 VIEWS COMPARISON:  None Available. FINDINGS: There is no evidence of thoracic spine fracture. Alignment is normal. Mild multilevel degenerate disc disease with disc height loss and  marginal spurring. IMPRESSION: Mild multilevel degenerative disc disease. Electronically Signed   By: Larose Hires D.O.   On: 06/09/2023 14:35       Assessment &  Plan:  Chronic venous insufficiency Assessment & Plan: Continue compression hose.  Continue leg elevation.  Swelling improved.  Skin - improved.  Follow.    Type 2 diabetes mellitus with hyperglycemia, without long-term current use of insulin (HCC) Assessment & Plan: On ozempic and metformin.  Increase metformin as outlined. Discussed diet and exercise.  Follow met b and a1c.    Orders: -     Ozempic (1 MG/DOSE); INJECT 1 MG UNDER THE SKIN ONCE A WEEK  Dispense: 9 mL; Refill: 1  Essential hypertension Assessment & Plan: Continue lisinopril and hctz and amlodipine regularly.  Pressures as outlined. Follow metabolic panel.    Family history of colon cancer Assessment & Plan: Colonoscopy 11/2020.  Per report, recommended f/u in 7 years.    Hypercholesterolemia Assessment & Plan: On crestor.  Low cholesterol diet and exercise.  Follow lipid panel and liver function tests.     Lower extremity edema Assessment & Plan: Improved.  Follow.  Compression hose.    Secondary diabetes with peripheral circulatory disorder [facility]) Assessment & Plan: Low carb diet and exercise.  On ozempic and metformin.  Increase metformin as directed. Follow met b and a1c.  Lower extremity swelling improved.       Other orders -     Lisinopril; Take 1 tablet (40 mg total) by mouth daily.  Dispense: 90 tablet; Refill: 3 -     metFORMIN HCl; Take one tablet in am with breakfast and two tablets with evening meal.  Dispense: 270 tablet; Refill: 1 -     Rosuvastatin Calcium; Take 1 tablet (10 mg total) by mouth daily.  Dispense: 90 tablet; Refill: 3     Dale [place], MD

## 2023-06-22 NOTE — Telephone Encounter (Signed)
Noted  

## 2023-06-23 ENCOUNTER — Ambulatory Visit: Payer: Managed Care, Other (non HMO) | Admitting: Internal Medicine

## 2023-06-26 ENCOUNTER — Encounter: Payer: Self-pay | Admitting: Internal Medicine

## 2023-06-26 NOTE — Assessment & Plan Note (Signed)
Improved.  Follow.  Compression hose.

## 2023-06-26 NOTE — Assessment & Plan Note (Signed)
Continue compression hose.  Continue leg elevation.  Swelling improved.  Skin - improved.  Follow.  

## 2023-06-26 NOTE — Assessment & Plan Note (Signed)
Low carb diet and exercise.  On ozempic and metformin.  Increase metformin as directed. Follow met b and a1c.  Lower extremity swelling improved.

## 2023-06-26 NOTE — Assessment & Plan Note (Signed)
On crestor.  Low cholesterol diet and exercise.  Follow lipid panel and liver function tests.

## 2023-06-26 NOTE — Assessment & Plan Note (Addendum)
On ozempic and metformin.  Increase metformin as outlined. Discussed diet and exercise.  Follow met b and a1c.

## 2023-06-26 NOTE — Assessment & Plan Note (Signed)
Colonoscopy 11/2020.  Per report, recommended f/u in 7 years.

## 2023-06-26 NOTE — Assessment & Plan Note (Signed)
Continue lisinopril and hctz and amlodipine regularly.  Pressures as outlined. Follow metabolic panel.

## 2023-07-19 ENCOUNTER — Other Ambulatory Visit: Payer: Managed Care, Other (non HMO) | Admitting: Pharmacist

## 2023-07-19 NOTE — Progress Notes (Signed)
07/19/2023 Name: [name] MRN: [id number] DOB: 1961/03/09  Chief Complaint  Patient presents with   Medication Management   Diabetes    [name] is a 62 y.o. year old male who presented for a telephone visit.   They were referred to the pharmacist by their PCP for assistance in managing diabetes, hypertension, and hyperlipidemia.    Subjective:  Care Team: Primary Care Provider: Dale Smoke [place], MD ; Next Scheduled Visit: 09/22/23  Medication Access/Adherence  Current Pharmacy:  [facility]) - [place], [place] - [address] [facility] [address] [place] [address] [place] [address] Phone: [phone] Fax: [phone]  [id number] [place], [place] - [address] AT [employer] [address] [place] [address] Phone: [phone] Fax: [phone]  [facility] [id number] [place], [place] - [address] [place] [address] Phone: [phone] Fax: [phone]  [facility] - [place], [place] - [address] [place] [address] Phone: [phone] Fax: [phone]   Patient reports affordability concerns with their medications: No  Patient reports access/transportation concerns to their pharmacy: No  Patient reports adherence concerns with their medications:  No     Diabetes:  Current medications: metformin 500 mg QAM, 1000 mg QPM, Ozempic 1 mg weekly   Wearing DexCom G7. Reports Time in Range 75% for past 14 days, 63% in past 7 days. Reports more ice cream in the past week.    Patient denies hypoglycemic s/sx including dizziness, shakiness, sweating. Patient denies hyperglycemic symptoms including polyuria, polydipsia, polyphagia, nocturia, neuropathy, blurred vision.  Current physical activity: limited by back pain, but has plans to increase moving forward. Beginning in September,  has a new physical activity plan - walking more, tweaking diet. Trip in February to [place].   Hypertension:  Current medications: amlodipine 5 mg daily, lisinopril 40 mg daily, hydrochlorothiazide 25 mg daily  Patient has a validated, automated, upper arm home BP cuff Current blood pressure readings readings: reports readings   Appointment with Sleep Medicine Friday regarding sleep apnea .  Hyperlipidemia/ASCVD Risk Reduction  Current lipid lowering medications: rosuvastatin 10 mg daily    Objective:  Lab Results  Component Value Date   HGBA1C 7.1 (H) 06/08/2023    Lab Results  Component Value Date   CREATININE 0.87 06/08/2023   BUN 18 06/08/2023   NA 139 06/08/2023   K 3.4 (L) 06/08/2023   CL 96 06/08/2023   CO2 28 06/08/2023    Lab Results  Component Value Date   CHOL 128 06/08/2023   HDL 37 (L) 06/08/2023   LDLCALC 70 06/08/2023   TRIG 112 06/08/2023   CHOLHDL 3.5 06/08/2023    Medications Reviewed Today     Reviewed by Alden Hipp, RPH-CPP (Pharmacist) on 07/19/23 at 1309  Med List Status: <None>   Medication Order Taking? Sig Documenting Provider Last Dose Status Informant  amLODipine (NORVASC) 5 MG tablet [id number] Yes TAKE 1 TABLET(5 MG) BY MOUTH DAILY Dale [place], MD Taking Active   cholecalciferol (VITAMIN D) 1000 units tablet [id number]  Take 1,000 Units by mouth daily. [provider]  Active Self  Continuous Blood Gluc Sensor (DEXCOM G7 SENSOR) MISC [id number] Yes APPLY 1 SENSOR TOPICALLY AS DIRECTED Dale [place], MD Taking Active   hydrochlorothiazide (HYDRODIURIL) 25 MG tablet [id number] Yes TAKE 1 TABLET(25 MG) BY MOUTH DAILY  Dale [place], MD Taking Active   lisinopril (ZESTRIL) 40 MG tablet [id number] Yes Take 1 tablet (40 mg total) by mouth daily. Dale [place], MD Taking Active   loratadine (CLARITIN) 10 MG tablet [id number] Yes Take 10 mg by mouth daily as needed for allergies.  [provider] Taking Active    metFORMIN (GLUCOPHAGE) 500 MG tablet [id number] Yes Take one tablet in am with breakfast and two tablets with evening meal. Dale [place], MD Taking Active   rosuvastatin (CRESTOR) 10 MG tablet [id number] Yes Take 1 tablet (10 mg total) by mouth daily. Dale [place], MD Taking Active   Semaglutide, 1 MG/DOSE, (OZEMPIC, 1 MG/DOSE,) 4 MG/3ML SOPN [id number] Yes INJECT 1 MG UNDER THE SKIN ONCE A WEEK Dale [place], MD Taking Active               Assessment/Plan:   Diabetes: - Currently uncontrolled in the past week, but improved time in range prior to that.  - Reviewed goal A1c and time in range.  - Reviewed dietary modifications including: decrease ice cream consumption from this past week.  - Reviewed lifestyle modifications including: praised for goal of increased physical activity next month - Discussed options, including increasing Ozempic, changing to [employer] (especially given recent data with Mounjaro and sleep apnea). Patient elects to continue current regimen at this time, as he plans to focus on nutrition/physical activity more moving forward. Recommend to continue current regimen at this time. Follow up with PCP in October. If A1c not at goal, recommend to increase Ozempic or change to [facility].   Hypertension: - Currently controlled.  - Reviewed appropriate blood pressure monitoring technique and reviewed goal blood pressure. Recommended to check home blood pressure and heart rate periodically. - Discussed benefit of treatment of sleep apnea on blood pressure, cardiovascular risk - Recommend to continue current regimen at this time  Hyperlipidemia/ASCVD Risk Reduction: - Currently controlled at goal ~ 70.  - Recommend to continue current regimen    Follow Up Plan: PCP in 8 weeks  Catie Eppie Gibson, PharmD, BCACP, CPP Clinical Pharmacist [facility] [phone]

## 2023-07-19 NOTE — Patient Instructions (Signed)
Virl Diamond,   It was great talking to you!  Our goal Time in Range for an A1c of <7% is >70%.   Moving forward, we could increase Ozempic to 2 mg or change to [facility] - this is a different weekly injectable with generally greater efficacy with weight loss and A1c reduction.   Please reach out with any questions!  Catie Eppie Gibson, PharmD, BCACP, CPP Clinical Pharmacist [facility] [phone]

## 2023-07-22 ENCOUNTER — Ambulatory Visit: Payer: Managed Care, Other (non HMO) | Admitting: Internal Medicine

## 2023-07-22 ENCOUNTER — Encounter: Payer: Self-pay | Admitting: Internal Medicine

## 2023-07-22 VITALS — BP 126/80 | HR 71 | Temp 98.1°F | Ht 73.0 in | Wt 289.6 lb

## 2023-07-22 DIAGNOSIS — G4733 Obstructive sleep apnea (adult) (pediatric): Secondary | ICD-10-CM | POA: Diagnosis not present

## 2023-07-22 NOTE — Patient Instructions (Addendum)
Start AUTO-CPAP 5-12 cm h20 for Sleep apnea  Recommend weight loss   Exercise encouraged, as tolerated. Encouraged proper weight management.  Important to get eight or more hours of sleep  Limiting the use of the computer and television before bedtime.  Decrease naps during the day, so night time sleep will become enhanced.  Limit caffeine, and sleep deprivation.   HTN, stroke, uncontrolled diabetes and heart failure are potential risk factors.  Risk of untreated sleep apnea including cardiac arrhthymias, stroke, DM, heart failure

## 2023-07-22 NOTE — Progress Notes (Signed)
Name: [name] MRN: [id number] DOB: 1961/06/26    CHIEF COMPLAINT:  Follow Up Assessment of Sleep Anea   HISTORY OF PRESENT ILLNESS: Follow up OSA assessment HST performed shows AHI 19 At this time patient does not want to try auto CPAP at this time however I did encourage him to at least plan for the initial visit and assessment    Discussed sleep data and reviewed with patient.  Encouraged proper weight management.  Discussed driving precautions and its relationship with hypersomnolence.  Discussed operating dangerous equipment and its relationship with hypersomnolence.  Discussed sleep hygiene, and benefits of a fixed sleep waked time.  The importance of getting eight or more hours of sleep discussed with patient.  Discussed limiting the use of the computer and television before bedtime.  Decrease naps during the day, so night time sleep will become enhanced.  Limit caffeine, and sleep deprivation.  HTN, stroke, and heart failure are potential risk factors.   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  has a past surgical history that includes Cholecystectomy (2002); Hernia repair [phone]); Colonoscopy with propofol (N/A, 06/27/2015); and Colonoscopy with propofol (N/A, 12/12/2020). Prior to Admission medications   Medication Sig Start Date End Date Taking? Authorizing Provider  amLODipine (NORVASC) 5 MG tablet TAKE 1 TABLET(5 MG) BY MOUTH  DAILY 05/03/23   Dale [place], MD  cholecalciferol (VITAMIN D) 1000 units tablet Take 1,000 Units by mouth daily.    [provider]  Continuous Blood Gluc Sensor (DEXCOM G7 SENSOR) MISC APPLY 1 SENSOR TOPICALLY AS DIRECTED 02/01/23   Dale [place], MD  hydrochlorothiazide (HYDRODIURIL) 25 MG tablet TAKE 1 TABLET(25 MG) BY MOUTH DAILY 10/06/22   Dale [place], MD  lisinopril (ZESTRIL) 40 MG tablet Take 1 tablet (40 mg total) by mouth daily. 06/22/23   Dale [place], MD  loratadine (CLARITIN) 10 MG tablet Take 10 mg by mouth daily as needed for allergies.     [provider]  metFORMIN (GLUCOPHAGE) 500 MG tablet Take one tablet in am with breakfast and two tablets with evening meal. 06/22/23   Dale [place], MD  rosuvastatin (CRESTOR) 10 MG tablet Take 1 tablet (10 mg total) by mouth daily. 06/22/23   Dale [place], MD  Semaglutide, 1 MG/DOSE, (OZEMPIC, 1 MG/DOSE,) 4 MG/3ML SOPN INJECT 1 MG UNDER THE SKIN ONCE A WEEK 06/22/23   Dale Gholson, MD   No Known Allergies  FAMILY HISTORY:  family history includes Arthritis in his maternal grandfather, maternal grandmother, mother, paternal grandfather, and paternal grandmother; Breast cancer in his paternal grandmother; Diabetes in his father, mother, paternal grandfather, and sister; Heart disease in his father, maternal grandfather, and paternal grandfather; Hyperlipidemia in his father, maternal grandfather, maternal grandmother, and paternal grandfather; Hypertension in his father, maternal grandfather, maternal grandmother, mother, paternal grandfather, and paternal grandmother; Stomach cancer in his mother. SOCIAL HISTORY:  reports that he has never smoked. He has never used smokeless tobacco. He reports current alcohol use. He reports that he does not use drugs.  BP 126/80 (BP Location:  Right Arm, Cuff Size: Large)   Pulse 71   Temp 98.1 F (36.7 C) (Oral)   Ht 6\' 1"  (1.854 m)   Wt 289 lb 9.6 oz (131.4 kg)   SpO2 95%    BMI 38.21 kg/m    Review of Systems: Gen:  Denies  fever, sweats, chills weight loss  HEENT: Denies blurred vision, double vision, ear pain, eye pain, hearing loss, nose bleeds, sore throat Cardiac:  No dizziness, chest pain or heaviness, chest tightness,edema, No JVD Resp:   No cough, -sputum production, -shortness of breath,-wheezing, -hemoptysis,  Other:  All other systems negative   Physical Examination:   General Appearance: No distress  EYES PERRLA, EOM intact.   NECK Supple, No JVD Pulmonary: normal breath sounds, No wheezing.  CardiovascularNormal S1,S2.  No m/r/g.   Abdomen: Benign, Soft, non-tender. Neurology UE/LE 5/5 strength, no focal deficits Ext pulses intact, cap refill intact ALL OTHER ROS ARE NEGATIVE    ASSESSMENT AND PLAN SYNOPSIS  Patient with signs and symptoms and diagnosis of OSA  in the setting of obesity and deconditioned state  OSA assessment Patient is very hesitant to starting CPAP therapy however we will place order and initiate consultation with DME company   Obesity -recommend significant weight loss -recommend changing diet  Deconditioned state -Recommend increased daily activity and exercise   CURRENT MEDICATIONS REVIEWED AT LENGTH WITH PATIENT TODAY   Patient  satisfied with Plan of action and management. All questions answered  Follow up 3 months  Total Time Spent  25 mins   Wallis Bamberg Santiago Glad, M.D.  [facility]  Medical Director [facility] Medical Director [facility] Cardio-Pulmonary Department

## 2023-07-23 ENCOUNTER — Encounter: Payer: Self-pay | Admitting: Internal Medicine

## 2023-07-23 DIAGNOSIS — G4730 Sleep apnea, unspecified: Secondary | ICD-10-CM | POA: Insufficient documentation

## 2023-07-30 ENCOUNTER — Other Ambulatory Visit: Payer: Self-pay | Admitting: Internal Medicine

## 2023-09-12 ENCOUNTER — Ambulatory Visit: Payer: Managed Care, Other (non HMO) | Admitting: Podiatry

## 2023-09-12 ENCOUNTER — Encounter: Payer: Self-pay | Admitting: Podiatry

## 2023-09-12 DIAGNOSIS — M79675 Pain in left toe(s): Secondary | ICD-10-CM

## 2023-09-12 DIAGNOSIS — E1151 Type 2 diabetes mellitus with diabetic peripheral angiopathy without gangrene: Secondary | ICD-10-CM

## 2023-09-12 DIAGNOSIS — B351 Tinea unguium: Secondary | ICD-10-CM | POA: Diagnosis not present

## 2023-09-12 DIAGNOSIS — M79674 Pain in right toe(s): Secondary | ICD-10-CM | POA: Diagnosis not present

## 2023-09-12 NOTE — Progress Notes (Signed)
  Subjective:  [name], male    DOB: [birth year],  MRN: [id number]  62 y.o. male presents with at risk foot care. Pt has h/o NIDDM with PAD and painful thick toenails that are difficult to trim. Pain interferes with ambulation. Aggravating factors include wearing enclosed shoe gear. Pain is relieved with periodic professional debridement.   PCP: Dale Bristow, MD.  New problem(s): None.   Review of Systems: Negative except as noted in the HPI.   No Known Allergies  Objective:  There were no vitals filed for this visit. Constitutional Patient is a pleasant 62 y.o. Caucasian male obese in NAD. AAO x 3.  Vascular Capillary fill time to digits <3 seconds.  DP/PT pulse(s) are faintly palpable b/l lower extremities. Pedal hair absent b/l. Lower extremity skin temperature gradient warm to warm b/l. No pain with calf compression b/l. No cyanosis or clubbing noted. No ischemia nor gangrene noted b/l. Patient wearing compression hose on today's visit. +1 pitting edema noted BLE. Varicosities present b/l.  Neurologic Protective sensation intact 5/5 intact bilaterally with 10g monofilament b/l. Vibratory sensation intact b/l. No clonus b/l.   Dermatologic Pedal skin is thin, shiny and atrophic b/l.  No open wounds b/l lower extremities. No interdigital macerations b/l lower extremities. Toenails 1-5 b/l elongated, discolored, dystrophic, thickened, crumbly with subungual debris and tenderness to dorsal palpation. No corns, calluses nor porokeratotic lesions noted.  Orthopedic: Normal muscle strength 5/5 to all lower extremity muscle groups bilaterally. No pain, crepitus or joint limitation noted with ROM bilateral LE. No gross bony deformities bilaterally.   Last HgA1c:     Latest Ref Rng & Units 06/08/2023    8:09 AM 12/22/2022    7:31 AM  Hemoglobin A1C  Hemoglobin-A1c 4.8 - 5.6 % 7.1  7.0    Assessment:   1. Pain due to onychomycosis of toenails of both feet   2. Type II  diabetes mellitus with peripheral circulatory disorder [facility])    Plan:  Patient was evaluated and treated and all questions answered. -Consent given for treatment as described below: -Examined patient. -Continue foot and shoe inspections daily. Monitor blood glucose per PCP/Endocrinologist's recommendations. -Continue supportive shoe gear daily. -Mycotic toenails 1-5 bilaterally were debrided in length and girth with sterile nail nippers and dremel without incident. -Patient/POA to call should there be question/concern in the interim.  Return in about 3 months (around 12/13/2023).  Freddie Breech, DPM

## 2023-09-22 ENCOUNTER — Ambulatory Visit: Payer: Managed Care, Other (non HMO) | Admitting: Internal Medicine

## 2023-09-22 ENCOUNTER — Encounter: Payer: Self-pay | Admitting: Internal Medicine

## 2023-09-22 VITALS — BP 136/74 | HR 71 | Temp 98.2°F | Resp 16 | Ht 73.0 in | Wt 293.0 lb

## 2023-09-22 DIAGNOSIS — I890 Lymphedema, not elsewhere classified: Secondary | ICD-10-CM

## 2023-09-22 DIAGNOSIS — M5489 Other dorsalgia: Secondary | ICD-10-CM

## 2023-09-22 DIAGNOSIS — Z23 Encounter for immunization: Secondary | ICD-10-CM | POA: Diagnosis not present

## 2023-09-22 DIAGNOSIS — I10 Essential (primary) hypertension: Secondary | ICD-10-CM

## 2023-09-22 DIAGNOSIS — E1165 Type 2 diabetes mellitus with hyperglycemia: Secondary | ICD-10-CM | POA: Diagnosis not present

## 2023-09-22 DIAGNOSIS — Z7984 Long term (current) use of oral hypoglycemic drugs: Secondary | ICD-10-CM

## 2023-09-22 DIAGNOSIS — D649 Anemia, unspecified: Secondary | ICD-10-CM

## 2023-09-22 DIAGNOSIS — Z7985 Long-term (current) use of injectable non-insulin antidiabetic drugs: Secondary | ICD-10-CM

## 2023-09-22 DIAGNOSIS — Z860100 Personal history of colon polyps, unspecified: Secondary | ICD-10-CM

## 2023-09-22 DIAGNOSIS — G4730 Sleep apnea, unspecified: Secondary | ICD-10-CM

## 2023-09-22 DIAGNOSIS — E7800 Pure hypercholesterolemia, unspecified: Secondary | ICD-10-CM

## 2023-09-22 LAB — HM DIABETES FOOT EXAM

## 2023-09-22 NOTE — Progress Notes (Signed)
Subjective:    [name], male    DOB: [birth year], 62 y.o.   MRN: [id number]  Patient here for  Chief Complaint  Patient presents with   Medical Management of Chronic Issues    HPI Here for a scheduled follow up. Here to follow up regarding diabetes and hypertension. Had f/u with pulmonary 07/22/23 - recommended cpap therapy for OSA. Discussed today.  He is wanting to hold on starting at this time.  Plans to start exercising more. Discussed diet and exercise.  No chest pain or sob reported.  No cough or congestion.  No abdominal pain or bowel change reported.  Wearing compression hose daily. Does reports some low back pain.  Intermittent.  No change.  Does not feel needs any further intervention. Blood pressure 120/70.  Blood sugars 160s.    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)    Pt states controlled by diet for last 4 years   Depression    Environmental allergies    Family history of colon cancer    GERD (gastroesophageal reflux disease)    History of chicken pox    Hyperlipidemia    Hypertension    Venous stasis    Vitamin D deficiency    Past Surgical History:  Procedure Laterality Date   CHOLECYSTECTOMY  2002   COLONOSCOPY WITH PROPOFOL N/A 06/27/2015   Procedure: COLONOSCOPY WITH PROPOFOL;  Surgeon: Christena Deem, MD;  Location: [facility] ENDOSCOPY;  Service: Endoscopy;  Laterality: N/A;   COLONOSCOPY WITH PROPOFOL N/A 12/12/2020   Procedure: COLONOSCOPY WITH PROPOFOL;  Surgeon: Regis Bill, MD;  Location: [facility] ENDOSCOPY;  Service: Endoscopy;  Laterality: N/A;   HERNIA REPAIR  1962   Family History  Problem Relation Age of Onset   Stomach cancer Mother    Arthritis Mother    Diabetes Mother    Hypertension Mother    Hyperlipidemia Father    Heart disease Father    Diabetes Father    Hypertension Father    Arthritis Maternal Grandmother    Hyperlipidemia Maternal  Grandmother    Hypertension Maternal Grandmother    Arthritis Maternal Grandfather    Hyperlipidemia Maternal Grandfather    Heart disease Maternal Grandfather    Hypertension Maternal Grandfather    Arthritis Paternal Grandmother    Breast cancer Paternal Grandmother    Hypertension Paternal Grandmother    Arthritis Paternal Grandfather    Hyperlipidemia Paternal Grandfather    Heart disease Paternal Grandfather    Diabetes Paternal Grandfather    Hypertension Paternal Grandfather    Diabetes Sister    Social History   Socioeconomic History   Marital status: Widowed    Spouse name: Not on file   Number of children: Not on file   Years of education: Not on file   Highest education level: Not on file  Occupational History   Not on file  Tobacco Use   Smoking status: Never   Smokeless tobacco: Never  Vaping Use   Vaping status: Never Used  Substance and Sexual Activity   Alcohol use: Yes    Alcohol/week: 0.0 - 1.0 standard drinks of alcohol    Comment: 1 drink every 2 months   Drug use: No   Sexual activity: Not on file  Other Topics Concern   Not on file  Social History Narrative   Not on file   Social Determinants of Health   Financial Resource Strain: Low  Risk  (11/20/2021)   Overall Financial Resource Strain (CARDIA)    Difficulty of Paying Living Expenses: Not hard at all  Food Insecurity: Not on file  Transportation Needs: Not on file  Physical Activity: Inactive (05/29/2020)   Exercise Vital Sign    Days of Exercise per Week: 0 days    Minutes of Exercise per Session: 0 min  Stress: Not on file  Social Connections: Not on file     Review of Systems  Constitutional:  Negative for appetite change and unexpected weight change.  HENT:  Negative for congestion and sinus pressure.   Respiratory:  Negative for cough, chest tightness and shortness of breath.   Cardiovascular:  Negative for chest pain and palpitations.       Pedal and lower extremity swelling  - stable.   Gastrointestinal:  Negative for abdominal pain, diarrhea, nausea and vomiting.  Genitourinary:  Negative for difficulty urinating and dysuria.  Musculoskeletal:  Positive for back pain. Negative for joint swelling and myalgias.  Skin:  Negative for color change and rash.  Neurological:  Negative for dizziness and headaches.  Psychiatric/Behavioral:  Negative for agitation and dysphoric mood.        Objective:     BP 136/74   Pulse 71   Temp 98.2 F (36.8 C)   Resp 16   Ht 6\' 1"  (1.854 m)   Wt 293 lb (132.9 kg)   SpO2 98%   BMI 38.66 kg/m  Wt Readings from Last 3 Encounters:  09/22/23 293 lb (132.9 kg)  07/22/23 289 lb 9.6 oz (131.4 kg)  06/22/23 279 lb (126.6 kg)    Physical Exam Constitutional:      General: He is not in acute distress.    Appearance: Normal appearance. He is well-developed.  HENT:     Head: Normocephalic and atraumatic.     Right Ear: External ear normal.     Left Ear: External ear normal.  Eyes:     General: No scleral icterus.       Right eye: No discharge.        Left eye: No discharge.  Cardiovascular:     Rate and Rhythm: Normal rate and regular rhythm.  Pulmonary:     Effort: Pulmonary effort is normal. No respiratory distress.     Breath sounds: Normal breath sounds.  Abdominal:     General: Bowel sounds are normal.     Palpations: Abdomen is soft.     Tenderness: There is no abdominal tenderness.  Musculoskeletal:        General: No tenderness.     Cervical back: Neck supple. No tenderness.     Comments: Pedal and lower extremity swelling - stable. Stasis changes.   Lymphadenopathy:     Cervical: No cervical adenopathy.  Skin:    Findings: No erythema or rash.  Neurological:     Mental Status: He is alert.  Psychiatric:        Mood and Affect: Mood normal.        Behavior: Behavior normal.      Outpatient Encounter Medications as of 09/22/2023  Medication Sig   amLODipine (NORVASC) 5 MG tablet TAKE 1 TABLET(5  MG) BY MOUTH DAILY   cholecalciferol (VITAMIN D) 1000 units tablet Take 1,000 Units by mouth daily.   Continuous Blood Gluc Sensor (DEXCOM G7 SENSOR) MISC APPLY 1 SENSOR TOPICALLY AS DIRECTED   hydrochlorothiazide (HYDRODIURIL) 25 MG tablet TAKE 1 TABLET(25 MG) BY MOUTH DAILY   lisinopril (ZESTRIL) 40  MG tablet Take 1 tablet (40 mg total) by mouth daily.   loratadine (CLARITIN) 10 MG tablet Take 10 mg by mouth daily as needed for allergies.    metFORMIN (GLUCOPHAGE) 500 MG tablet Take one tablet in am with breakfast and two tablets with evening meal.   rosuvastatin (CRESTOR) 10 MG tablet TAKE 1 TABLET BY MOUTH EVERY DAY   Semaglutide, 1 MG/DOSE, (OZEMPIC, 1 MG/DOSE,) 4 MG/3ML SOPN INJECT 1 MG UNDER THE SKIN ONCE A WEEK   No facility-administered encounter medications on file as of 09/22/2023.     Lab Results  Component Value Date   WBC 9.3 06/09/2023   HGB 13.9 06/09/2023   HCT 41.9 06/09/2023   PLT 340 06/09/2023   GLUCOSE 142 (H) 06/08/2023   CHOL 128 06/08/2023   TRIG 112 06/08/2023   HDL 37 (L) 06/08/2023   LDLCALC 70 06/08/2023   ALT 17 06/08/2023   AST 14 06/08/2023   NA 139 06/08/2023   K 3.4 (L) 06/08/2023   CL 96 06/08/2023   CREATININE 0.87 06/08/2023   BUN 18 06/08/2023   CO2 28 06/08/2023   TSH 1.400 12/22/2022   HGBA1C 7.1 (H) 06/08/2023    DG Abd 1 View  Result Date: 06/09/2023 CLINICAL DATA:  Abdominal pain EXAM: ABDOMEN - 1 VIEW COMPARISON:  None Available. FINDINGS: Bowel gas pattern is nonspecific. Moderate to large amount of stool is seen in colon, mostly in the ascending colon. There is no small bowel dilation. Surgical clips are seen in gallbladder fossa. Degenerative changes are noted in thoracic and lumbar spine. No abnormal masses or calcifications are seen. Kidneys are partly obscured by bowel contents. IMPRESSION: Nonspecific bowel gas pattern. Moderate to large stool burden in colon, more so in ascending colon. Electronically Signed   By: Ernie Avena M.D.   On: 06/09/2023 14:41   DG Lumbar Spine 2-3 Views  Result Date: 06/09/2023 CLINICAL DATA:  Back pain, no recent trauma. EXAM: LUMBAR SPINE - 2-3 VIEW COMPARISON:  None Available. FINDINGS: There is no evidence of lumbar spine fracture. Mild anterior vertebral body height loss suggesting mild compression deformity of L1. Straightening of the lumbar spine. Mild multilevel degenerate disc disease. IMPRESSION: Mild anterior vertebral body height loss suggesting mild compression deformity of L1, age indeterminate. 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 Electronically Signed   By: Larose Hires D.O.   On: 06/09/2023 14:38   DG Thoracic Spine 2 View  Result Date: 06/09/2023 CLINICAL DATA:  Increased back pain.  No injury. EXAM: THORACIC SPINE 2 VIEWS COMPARISON:  None Available. FINDINGS: There is no evidence of thoracic spine fracture. Alignment is normal. Mild multilevel degenerate disc disease with disc height loss and marginal spurring. IMPRESSION: Mild multilevel degenerative disc disease. Electronically Signed   By: Larose Hires D.O.   On: 06/09/2023 14:35       Assessment & Plan:  Type 2 diabetes mellitus with hyperglycemia, without long-term current use of insulin (HCC) Assessment & Plan: On ozempic and metformin.  Have discussed diet and exercise.  Follow met b and a1c.    Orders: -     Hemoglobin A1c -     Microalbumin / creatinine urine ratio  Hypercholesterolemia Assessment & Plan: On crestor.  Low cholesterol diet and exercise.  Follow lipid panel and liver function tests.    Orders: -     Lipid panel -     Hepatic function panel -  Basic metabolic panel -     TSH  Need for influenza vaccination -     Flu vaccine trivalent PF, 6mos and older(Flulaval,Afluria,Fluarix,Fluzone)  Sleep apnea, unspecified type Assessment & Plan: Had f/u with pulmonary 07/22/23 - recommended cpap therapy for OSA.  Discussed today.  He is wanting to hold on starting at this time.    Lymphedema Assessment & Plan: Compression hose.  Leg elevation.  Overall improved.    History of colonic polyps Assessment & Plan: Colonoscopy 12/12/20 - polyp hepatic flexure, polyp - rectum and internal hemorrhoids. Recommended f/u in 7 years.     Essential hypertension Assessment & Plan: Continue lisinopril and hctz and amlodipine regularly.  Pressures as outlined. Follow metabolic panel.    Anemia, unspecified type Assessment & Plan: Follow cbc.    Midline back pain, unspecified back location, unspecified chronicity Assessment & Plan: Occasional flares. Feels stable.  Does not feel needs any further intervention.  Will notify me if symptoms change/worsen or if desires further intervention.       Dale Midway, MD

## 2023-09-24 ENCOUNTER — Encounter: Payer: Self-pay | Admitting: Internal Medicine

## 2023-09-24 NOTE — Assessment & Plan Note (Signed)
On ozempic and metformin.  Have discussed diet and exercise.  Follow met b and a1c.

## 2023-09-24 NOTE — Assessment & Plan Note (Signed)
Had f/u with pulmonary 07/22/23 - recommended cpap therapy for OSA. Discussed today.  He is wanting to hold on starting at this time.

## 2023-09-24 NOTE — Assessment & Plan Note (Signed)
Continue lisinopril and hctz and amlodipine regularly.  Pressures as outlined. Follow metabolic panel.

## 2023-09-24 NOTE — Assessment & Plan Note (Signed)
Compression hose.  Leg elevation.  Overall improved.

## 2023-09-24 NOTE — Assessment & Plan Note (Signed)
Colonoscopy 12/12/20 - polyp hepatic flexure, polyp - rectum and internal hemorrhoids. Recommended f/u in 7 years.   

## 2023-09-24 NOTE — Assessment & Plan Note (Signed)
Occasional flares. Feels stable.  Does not feel needs any further intervention.  Will notify me if symptoms change/worsen or if desires further intervention.

## 2023-09-24 NOTE — Assessment & Plan Note (Signed)
Follow cbc.  

## 2023-09-24 NOTE — Assessment & Plan Note (Signed)
On crestor.  Low cholesterol diet and exercise.  Follow lipid panel and liver function tests.

## 2023-09-28 LAB — HM DIABETES EYE EXAM

## 2023-10-06 ENCOUNTER — Other Ambulatory Visit: Payer: Self-pay | Admitting: Internal Medicine

## 2023-12-14 ENCOUNTER — Telehealth: Payer: Self-pay | Admitting: Internal Medicine

## 2023-12-14 DIAGNOSIS — G4719 Other hypersomnia: Secondary | ICD-10-CM

## 2023-12-14 DIAGNOSIS — G4733 Obstructive sleep apnea (adult) (pediatric): Secondary | ICD-10-CM

## 2023-12-14 NOTE — Telephone Encounter (Signed)
Patient checking on order for CPAP machine, Would like information for Inspire. Patient phone number is [phone].

## 2023-12-15 NOTE — Telephone Encounter (Signed)
Per Jeanice Lim, I would have Dr. Belia Heman reach out to Dr. Vassie Loll &/or Dr. Wynona Neat to see if one of them is able to do the DISE procedure for this patient. Once that's completed & pt is considered a good candidate, we would then refer out to ENT proider(s) Dr. Allena Katz, Dr. Irene Pap or Dr. Jenne Pane

## 2023-12-15 NOTE — Telephone Encounter (Signed)
Patient reports he would like to get Inspire in place of a CPAP machine. Patient did not get CPAP machine in August. He requested to hold off on order until the beginning of this year. Please advise.

## 2023-12-16 NOTE — Telephone Encounter (Signed)
Referral has been placed. Lm x1 the patient.

## 2023-12-19 ENCOUNTER — Encounter: Payer: Self-pay | Admitting: Podiatry

## 2023-12-19 ENCOUNTER — Ambulatory Visit: Payer: Managed Care, Other (non HMO) | Admitting: Podiatry

## 2023-12-19 VITALS — Ht 73.0 in | Wt 293.0 lb

## 2023-12-19 DIAGNOSIS — B351 Tinea unguium: Secondary | ICD-10-CM

## 2023-12-19 DIAGNOSIS — M79674 Pain in right toe(s): Secondary | ICD-10-CM | POA: Diagnosis not present

## 2023-12-19 DIAGNOSIS — M79675 Pain in left toe(s): Secondary | ICD-10-CM | POA: Diagnosis not present

## 2023-12-19 DIAGNOSIS — E1151 Type 2 diabetes mellitus with diabetic peripheral angiopathy without gangrene: Secondary | ICD-10-CM | POA: Diagnosis not present

## 2023-12-19 NOTE — Progress Notes (Signed)
  Subjective:  [name], male    DOB: Sep 09, 1961,  MRN: [id number]  63 y.o. male presents at risk foot care. Pt has h/o NIDDM with PAD and painful, elongated thickened toenails x 10 which are symptomatic when wearing enclosed shoe gear. This interferes with his/her daily activities. He and his family will be traveling to [place] December 31, 2023. Chief Complaint  Patient presents with   Nail Problem    Pt is here for [facility] last A1C was 7.8 PCP is Dr Lorin Picket and LOV was in November.   New problem(s): None   PCP is Dale [place], MD. No Known Allergies  Review of Systems: Negative except as noted in the HPI.   Objective:  [name] is a pleasant 63 y.o. male obese in NAD. AAO x 3.  Vascular Examination: Capillary fill time to digits <3 seconds.  DP/PT pulse(s) are faintly palpable b/l lower extremities. Pedal hair absent b/l. Lower extremity skin temperature gradient warm to warm b/l. No pain with calf compression b/l. No cyanosis or clubbing noted. No ischemia nor gangrene noted b/l. Patient wearing compression hose on today's visit. +1 pitting edema noted BLE. Varicosities present b/l.  Neurological Examination: Sensation grossly intact b/l with 10 gram monofilament. Vibratory sensation intact b/l.  Dermatological Examination: Pedal skin with normal turgor, texture and tone b/l. No open wounds nor interdigital macerations noted. Toenails 1-5 b/l thick, discolored, elongated with subungual debris and pain on dorsal palpation. Minimal hyperkeratos(is/es) noted left great toe and right great toe.   Musculoskeletal Examination: Muscle strength 5/5 to b/l LE.  No pain, crepitus noted b/l. No gross pedal deformities. Patient ambulates independently without assistive aids.   Radiographs: None  Last A1c:      Latest Ref Rng & Units 06/08/2023    8:09 AM 12/22/2022    7:31 AM  Hemoglobin A1C  Hemoglobin-A1c 4.8 - 5.6 % 7.1  7.0      Assessment:   1. Pain due  to onychomycosis of toenails of both feet   2. Type II diabetes mellitus with peripheral circulatory disorder [facility])    Plan:  Patient was evaluated and treated. All patient's and/or POA's questions/concerns addressed on today's visit. Toenails 1-5 debrided in length and girth without incident. Continue soft, supportive shoe gear daily. Report any pedal injuries to medical professional. Call office if there are any questions/concerns. -Minimal hyperkeratosis b/l great toes gently filed as a courtesy. -Continue foot and shoe inspections daily. Monitor blood glucose per PCP/Endocrinologist's recommendations. -Patient/POA to call should there be question/concern in the interim.  Return in about 3 months (around 03/18/2024).  Freddie Breech, DPM      [facility] LOCATION: [address], [place] [address]                   Office [phone]   [facility] LOCATION: [address] [place], [place] [address] Office [phone]

## 2023-12-20 NOTE — Telephone Encounter (Signed)
I have notified the patient. Nothing further needed.

## 2024-01-14 ENCOUNTER — Other Ambulatory Visit: Payer: Self-pay | Admitting: Internal Medicine

## 2024-01-14 DIAGNOSIS — E1165 Type 2 diabetes mellitus with hyperglycemia: Secondary | ICD-10-CM

## 2024-01-24 ENCOUNTER — Ambulatory Visit: Payer: Managed Care, Other (non HMO) | Admitting: Internal Medicine

## 2024-01-24 ENCOUNTER — Encounter: Payer: Self-pay | Admitting: Internal Medicine

## 2024-01-24 VITALS — BP 130/72 | HR 89 | Temp 97.9°F | Resp 16 | Ht 73.0 in | Wt 290.0 lb

## 2024-01-24 DIAGNOSIS — G4730 Sleep apnea, unspecified: Secondary | ICD-10-CM

## 2024-01-24 DIAGNOSIS — Z800 Family history of malignant neoplasm of digestive organs: Secondary | ICD-10-CM

## 2024-01-24 DIAGNOSIS — I872 Venous insufficiency (chronic) (peripheral): Secondary | ICD-10-CM

## 2024-01-24 DIAGNOSIS — E7800 Pure hypercholesterolemia, unspecified: Secondary | ICD-10-CM

## 2024-01-24 DIAGNOSIS — Z7985 Long-term (current) use of injectable non-insulin antidiabetic drugs: Secondary | ICD-10-CM

## 2024-01-24 DIAGNOSIS — Z7984 Long term (current) use of oral hypoglycemic drugs: Secondary | ICD-10-CM

## 2024-01-24 DIAGNOSIS — I10 Essential (primary) hypertension: Secondary | ICD-10-CM | POA: Diagnosis not present

## 2024-01-24 DIAGNOSIS — E1165 Type 2 diabetes mellitus with hyperglycemia: Secondary | ICD-10-CM

## 2024-01-24 NOTE — Progress Notes (Signed)
 Subjective:    [name], male    DOB: Jul 12, 1961, 63 y.o.   MRN: [id number]  Patient here for  Chief Complaint  Patient presents with   Medical Management of Chronic Issues    HPI Here for a scheduled follow up - follow up regarding diabetes and hypertension. Had f/u with pulmonary 07/22/23 - recommended cpap therapy for OSA. He has not set this up yet. Plans to arrange. Discussed importance of treating sleep apnea. No chest pain. Not exercising regularly. Did recently fly to [place]. Breathing stable. No increased cough or congestion. Bowels stable. States blood sugars 150-170 in am and about the same in pm.    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)    Pt states controlled by diet for last 4 years   Depression    Environmental allergies    Family history of colon cancer    GERD (gastroesophageal reflux disease)    History of chicken pox    Hyperlipidemia    Hypertension    Venous stasis    Vitamin D deficiency    Past Surgical History:  Procedure Laterality Date   CHOLECYSTECTOMY  2002   COLONOSCOPY WITH PROPOFOL N/A 06/27/2015   Procedure: COLONOSCOPY WITH PROPOFOL;  Surgeon: Christena Deem, MD;  Location: [facility] ENDOSCOPY;  Service: Endoscopy;  Laterality: N/A;   COLONOSCOPY WITH PROPOFOL N/A 12/12/2020   Procedure: COLONOSCOPY WITH PROPOFOL;  Surgeon: Regis Bill, MD;  Location: [facility] ENDOSCOPY;  Service: Endoscopy;  Laterality: N/A;   HERNIA REPAIR  1962   Family History  Problem Relation Age of Onset   Stomach cancer Mother    Arthritis Mother    Diabetes Mother    Hypertension Mother    Hyperlipidemia Father    Heart disease Father    Diabetes Father    Hypertension Father    Arthritis Maternal Grandmother    Hyperlipidemia Maternal Grandmother    Hypertension Maternal Grandmother    Arthritis Maternal Grandfather    Hyperlipidemia Maternal Grandfather    Heart  disease Maternal Grandfather    Hypertension Maternal Grandfather    Arthritis Paternal Grandmother    Breast cancer Paternal Grandmother    Hypertension Paternal Grandmother    Arthritis Paternal Grandfather    Hyperlipidemia Paternal Grandfather    Heart disease Paternal Grandfather    Diabetes Paternal Grandfather    Hypertension Paternal Grandfather    Diabetes Sister    Social History   Socioeconomic History   Marital status: Widowed    Spouse name: Not on file   Number of children: Not on file   Years of education: Not on file   Highest education level: Not on file  Occupational History   Not on file  Tobacco Use   Smoking status: Never   Smokeless tobacco: Never  Vaping Use   Vaping status: Never Used  Substance and Sexual Activity   Alcohol use: Yes    Alcohol/week: 0.0 - 1.0 standard drinks of alcohol    Comment: 1 drink every 2 months   Drug use: No   Sexual activity: Not on file  Other Topics Concern   Not on file  Social History Narrative   Not on file   Social Drivers of Health   Financial Resource Strain: Low Risk  (11/20/2021)   Overall Financial Resource Strain (CARDIA)    Difficulty of Paying Living Expenses: Not hard at all  Food Insecurity: Not on  file  Transportation Needs: Not on file  Physical Activity: Inactive (05/29/2020)   Exercise Vital Sign    Days of Exercise per Week: 0 days    Minutes of Exercise per Session: 0 min  Stress: Not on file  Social Connections: Not on file     Review of Systems  Constitutional:  Negative for appetite change and unexpected weight change.  HENT:  Negative for congestion and sinus pressure.   Respiratory:  Negative for cough, chest tightness and shortness of breath.   Cardiovascular:  Negative for chest pain and palpitations.       Chronic leg swelling. Stable.   Gastrointestinal:  Negative for abdominal pain, diarrhea, nausea and vomiting.  Genitourinary:  Negative for difficulty urinating and  dysuria.  Musculoskeletal:  Negative for joint swelling and myalgias.  Skin:  Negative for rash.       Stasis changes lower extremity.   Neurological:  Negative for dizziness and headaches.  Psychiatric/Behavioral:  Negative for agitation and dysphoric mood.        Objective:     BP 130/72   Pulse 89   Temp 97.9 F (36.6 C)   Resp 16   Ht 6\' 1"  (1.854 m)   Wt 290 lb (131.5 kg)   SpO2 98%   BMI 38.26 kg/m  Wt Readings from Last 3 Encounters:  01/24/24 290 lb (131.5 kg)  12/19/23 293 lb (132.9 kg)  09/22/23 293 lb (132.9 kg)    Physical Exam Vitals reviewed.  Constitutional:      General: He is not in acute distress.    Appearance: Normal appearance. He is well-developed.  HENT:     Head: Normocephalic and atraumatic.     Right Ear: External ear normal.     Left Ear: External ear normal.     Mouth/Throat:     Pharynx: No oropharyngeal exudate or posterior oropharyngeal erythema.  Eyes:     General: No scleral icterus.       Right eye: No discharge.        Left eye: No discharge.     Conjunctiva/sclera: Conjunctivae normal.  Cardiovascular:     Rate and Rhythm: Normal rate and regular rhythm.  Pulmonary:     Effort: Pulmonary effort is normal. No respiratory distress.     Breath sounds: Normal breath sounds.  Abdominal:     General: Bowel sounds are normal.     Palpations: Abdomen is soft.     Tenderness: There is no abdominal tenderness.  Musculoskeletal:        General: No tenderness.     Cervical back: Neck supple. No tenderness.     Comments: Chronic lower extremity swelling - stable.   Lymphadenopathy:     Cervical: No cervical adenopathy.  Skin:    Findings: No erythema or rash.  Neurological:     Mental Status: He is alert.  Psychiatric:        Mood and Affect: Mood normal.        Behavior: Behavior normal.         Outpatient Encounter Medications as of 01/24/2024  Medication Sig   cholecalciferol (VITAMIN D) 1000 units tablet Take 1,000  Units by mouth daily.   Continuous Blood Gluc Sensor (DEXCOM G7 SENSOR) MISC APPLY 1 SENSOR TOPICALLY AS DIRECTED   hydrochlorothiazide (HYDRODIURIL) 25 MG tablet TAKE 1 TABLET(25 MG) BY MOUTH DAILY   lisinopril (ZESTRIL) 40 MG tablet Take 1 tablet (40 mg total) by mouth daily.   loratadine (CLARITIN)  10 MG tablet Take 10 mg by mouth daily as needed for allergies.    metFORMIN (GLUCOPHAGE) 500 MG tablet Take one tablet in am with breakfast and two tablets with evening meal.   OZEMPIC, 1 MG/DOSE, 4 MG/3ML SOPN INJECT 1 MG UNDER THE SKIN ONCE A WEEK   rosuvastatin (CRESTOR) 10 MG tablet TAKE 1 TABLET BY MOUTH EVERY DAY   [DISCONTINUED] amLODipine (NORVASC) 5 MG tablet TAKE 1 TABLET(5 MG) BY MOUTH DAILY   No facility-administered encounter medications on file as of 01/24/2024.     Lab Results  Component Value Date   WBC 9.3 06/09/2023   HGB 13.9 06/09/2023   HCT 41.9 06/09/2023   PLT 340 06/09/2023   GLUCOSE 142 (H) 06/08/2023   CHOL 128 06/08/2023   TRIG 112 06/08/2023   HDL 37 (L) 06/08/2023   LDLCALC 70 06/08/2023   ALT 17 06/08/2023   AST 14 06/08/2023   NA 139 06/08/2023   K 3.4 (L) 06/08/2023   CL 96 06/08/2023   CREATININE 0.87 06/08/2023   BUN 18 06/08/2023   CO2 28 06/08/2023   TSH 1.400 12/22/2022   HGBA1C 7.1 (H) 06/08/2023    DG Abd 1 View Result Date: 06/09/2023 CLINICAL DATA:  Abdominal pain EXAM: ABDOMEN - 1 VIEW COMPARISON:  None Available. FINDINGS: Bowel gas pattern is nonspecific. Moderate to large amount of stool is seen in colon, mostly in the ascending colon. There is no small bowel dilation. Surgical clips are seen in gallbladder fossa. Degenerative changes are noted in thoracic and lumbar spine. No abnormal masses or calcifications are seen. Kidneys are partly obscured by bowel contents. IMPRESSION: Nonspecific bowel gas pattern. Moderate to large stool burden in colon, more so in ascending colon. Electronically Signed   By: Ernie Avena M.D.   On:  06/09/2023 14:41   DG Lumbar Spine 2-3 Views Result Date: 06/09/2023 CLINICAL DATA:  Back pain, no recent trauma. EXAM: LUMBAR SPINE - 2-3 VIEW COMPARISON:  None Available. FINDINGS: There is no evidence of lumbar spine fracture. Mild anterior vertebral body height loss suggesting mild compression deformity of L1. Straightening of the lumbar spine. Mild multilevel degenerate disc disease. IMPRESSION: Mild anterior vertebral body height loss suggesting mild compression deformity of L1, age indeterminate. 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 Electronically Signed   By: Larose Hires D.O.   On: 06/09/2023 14:38   DG Thoracic Spine 2 View Result Date: 06/09/2023 CLINICAL DATA:  Increased back pain.  No injury. EXAM: THORACIC SPINE 2 VIEWS COMPARISON:  None Available. FINDINGS: There is no evidence of thoracic spine fracture. Alignment is normal. Mild multilevel degenerate disc disease with disc height loss and marginal spurring. IMPRESSION: Mild multilevel degenerative disc disease. Electronically Signed   By: Larose Hires D.O.   On: 06/09/2023 14:35       Assessment & Plan:  Chronic venous insufficiency Assessment & Plan: Continue compression hose.  Continue leg elevation.  Swelling has overall improved.  Follow.   Essential hypertension Assessment & Plan: Continue lisinopril and hydrochlorothiazide and amlodipine.  Pressure as outlined.  No changes to medication today.  Follow metabolic panel.   Family history of colon cancer Assessment & Plan: Colonoscopy 11/2020.  Recommended follow-up colonoscopy in 7 years.   Hypercholesterolemia Assessment & Plan: On Crestor.  Low-cholesterol diet and exercise.  Follow lipid panel liver function test.   Sleep apnea, unspecified type Assessment & Plan: Has been evaluated by pulmonary.  Discussed  today.  They recommended CPAP.  He has not yet arranged.  Plans to arrange.  Discussed  today the importance of treating sleep apnea and discussed risk of untreated sleep apnea.   Type 2 diabetes mellitus with hyperglycemia, without long-term current use of insulin (HCC) Assessment & Plan: Currently on Ozempic and metformin.  Blood sugars reported as outlined.  Have discussed diet and exercise.  Follow met b and A1c. No changes in medication today. Need labs.       Dale [place], MD

## 2024-01-25 ENCOUNTER — Other Ambulatory Visit: Payer: Self-pay | Admitting: Internal Medicine

## 2024-01-29 ENCOUNTER — Encounter: Payer: Self-pay | Admitting: Internal Medicine

## 2024-01-29 NOTE — Assessment & Plan Note (Signed)
 Continue compression hose.  Continue leg elevation.  Swelling has overall improved.  Follow.

## 2024-01-29 NOTE — Assessment & Plan Note (Signed)
 Colonoscopy 11/2020.  Recommended follow-up colonoscopy in 7 years.

## 2024-01-29 NOTE — Assessment & Plan Note (Signed)
 Has been evaluated by pulmonary.  Discussed today.  They recommended CPAP.  He has not yet arranged.  Plans to arrange.  Discussed today the importance of treating sleep apnea and discussed risk of untreated sleep apnea.

## 2024-01-29 NOTE — Assessment & Plan Note (Signed)
 On Crestor.  Low-cholesterol diet and exercise.  Follow lipid panel liver function test.

## 2024-01-29 NOTE — Assessment & Plan Note (Signed)
 Continue lisinopril and hydrochlorothiazide and amlodipine.  Pressure as outlined.  No changes to medication today.  Follow metabolic panel.

## 2024-01-29 NOTE — Assessment & Plan Note (Signed)
 Currently on Ozempic and metformin.  Blood sugars reported as outlined.  Have discussed diet and exercise.  Follow met b and A1c. No changes in medication today. Need labs.

## 2024-02-11 ENCOUNTER — Other Ambulatory Visit: Payer: Self-pay | Admitting: Internal Medicine

## 2024-02-11 DIAGNOSIS — E1165 Type 2 diabetes mellitus with hyperglycemia: Secondary | ICD-10-CM

## 2024-02-18 ENCOUNTER — Ambulatory Visit

## 2024-03-14 ENCOUNTER — Other Ambulatory Visit: Payer: Self-pay | Admitting: Internal Medicine

## 2024-03-19 ENCOUNTER — Encounter: Payer: Self-pay | Admitting: Podiatry

## 2024-03-19 ENCOUNTER — Ambulatory Visit: Payer: Managed Care, Other (non HMO) | Admitting: Podiatry

## 2024-03-19 VITALS — Ht 73.0 in | Wt 290.0 lb

## 2024-03-19 DIAGNOSIS — M79675 Pain in left toe(s): Secondary | ICD-10-CM | POA: Diagnosis not present

## 2024-03-19 DIAGNOSIS — E1151 Type 2 diabetes mellitus with diabetic peripheral angiopathy without gangrene: Secondary | ICD-10-CM

## 2024-03-19 DIAGNOSIS — M79674 Pain in right toe(s): Secondary | ICD-10-CM

## 2024-03-19 DIAGNOSIS — B351 Tinea unguium: Secondary | ICD-10-CM | POA: Diagnosis not present

## 2024-03-19 NOTE — Progress Notes (Signed)
  Subjective:  Patient ID: GASPARD WILDER, male    DOB: 1961-02-10,  MRN: [id number]  63 y.o. male presents with at risk foot care. Pt has h/o NIDDM with PAD and painful elongated mycotic toenails 1-5 bilaterally which are tender when wearing enclosed shoe gear. Pain is relieved with periodic professional debridement. Chief Complaint  Patient presents with   Nail Problem    Pt is here for [facility] last A1C was 7.4 PCP is Dr Geralyn Knee and LOV was in March.     PCP: Dellar Fenton, MD.  New problem(s): None.   Review of Systems: Negative except as noted in the HPI.   No Known Allergies  Objective:  There were no vitals filed for this visit. Constitutional Patient is a pleasant 63 y.o. male in NAD. AAO x 3.  Vascular Capillary fill time to digits <3 seconds.  DP/PT pulse(s) are faintly palpable b/l lower extremities. +Varicosities. Trace edema b/l. Pedal hair absent b/l. Lower extremity skin temperature gradient warm to cool b/l. No pain with calf compression b/l. No cyanosis or clubbing noted. No ischemia nor gangrene noted b/l.   Neurologic Protective sensation intact 5/5 intact bilaterally with 10g monofilament b/l. Vibratory sensation intact b/l. No clonus b/l.   Dermatologic Pedal skin is thin, shiny and atrophic b/l.  No open wounds b/l lower extremities. No interdigital macerations b/l lower extremities. Toenails 1-5 b/l elongated, discolored, dystrophic, thickened, crumbly with subungual debris and tenderness to dorsal palpation. Minimal hyperkeratos(is/es) noted bilateral great toes.  Orthopedic: Normal muscle strength 5/5 to all lower extremity muscle groups bilaterally. Pes planus deformity noted bilateral LE. Patient ambulates independent of any assistive aids.   Last HgA1c:     Latest Ref Rng & Units 06/08/2023    8:09 AM  Hemoglobin A1C  Hemoglobin-A1c 4.8 - 5.6 % 7.1      Assessment:   1. Pain due to onychomycosis of toenails of both feet   2. Type II diabetes mellitus with  peripheral circulatory disorder [facility])    Plan:  Patient was evaluated and treated. All patient's and/or POA's questions/concerns addressed on today's visit. Mycotic toenails 1-5 debrided in length and girth without incident. Continue soft, supportive shoe gear daily. Report any pedal injuries to medical professional. Call office if there are any quesitons/concerns. -Patient/POA to call should there be question/concern in the interim.  Return in about 3 months (around 06/18/2024).  Luella Sager, DPM      [place] LOCATION: [address] [place] [address]                   Office [phone]   [facility] LOCATION: [address] [place], [place] [address] Office [phone]

## 2024-04-12 ENCOUNTER — Ambulatory Visit: Payer: Self-pay

## 2024-04-12 NOTE — Telephone Encounter (Signed)
 Patient called in stating he wanted to report a symptom to Dr. Geralyn Knee. Patient states the last three week she has noticed a nagging pain in his groin. Patient states he wants to report this because he does have a family hx of cancer, and also had a double hernia as an infant. Patient states there is not a noticeable lump, no changes in skin appearance, no changes in bowel movements or urination. Patient reports he has also had intermittent dizziness the last few days but feels this may be related to not resting and going nonstop. Patient denies chest pain/pressure, numbness/weakness of arms or legs. Patient educated to call 911 if he has any of those symptoms. Patient has upcoming appt with Dr. Geralyn Knee for a physical and would like to know if he should have orders or advice beforehand on his groin pain. Please advise.  Copied from CRM [id number]. Topic: Clinical - Red Word Triage >> Apr 12, 2024  4:27 PM Howard Macho wrote: Reason for CRM: sore in the groin area for a couple weeks Reason for Disposition  Nursing judgment or information in reference    Please notes  Answer Assessment - Initial Assessment Questions 1. REASON FOR CALL: "What is your main concern right now?"     Groin pain 2. ONSET: "When did the symptoms start?"     Three weeks 3. SEVERITY: "How bad is the pain?"     3-4 4. FEVER: "Do you have a fever?"     No 5. OTHER SYMPTOMS: "Do you have any other new symptoms?"     Just pain - dizziness 6. TREATMENTS AND RESPONSE: "What have you done so far to try to make this better? What medicines have you used?"     No  Protocols used: No Guideline Available-A-AH

## 2024-04-13 NOTE — Telephone Encounter (Signed)
 Spoke with pt to advise him to go to UC to be evaluated. Pt stated that if Dr. Geralyn Knee recommends that he be evaluated he will otherwise if she thinks it is okay for his to wait until his next appt on 06/04/2024 he will do that. He stated that the pain is not "debilitating" it is just a "nagging" pain. He stated that it has not affected his daily activities or anything else.

## 2024-04-13 NOTE — Telephone Encounter (Signed)
 If pain and dizziness, agree with going ahead with evaluation to help confirm diagnosis.

## 2024-04-13 NOTE — Telephone Encounter (Signed)
 Pt preferred to be seen in our office so he has been scheduled to see Tona Francis, NP on Thursday May 29th.

## 2024-04-18 LAB — BASIC METABOLIC PANEL WITH GFR
BUN/Creatinine Ratio: 22 (ref 10–24)
BUN: 17 mg/dL (ref 8–27)
CO2: 24 mmol/L (ref 20–29)
Calcium: 8.8 mg/dL (ref 8.6–10.2)
Chloride: 101 mmol/L (ref 96–106)
Creatinine, Ser: 0.79 mg/dL (ref 0.76–1.27)
Glucose: 142 mg/dL — ABNORMAL HIGH (ref 70–99)
Potassium: 3.3 mmol/L — ABNORMAL LOW (ref 3.5–5.2)
Sodium: 142 mmol/L (ref 134–144)
eGFR: 100 mL/min/{1.73_m2} (ref >59)

## 2024-04-18 LAB — MICROALBUMIN / CREATININE URINE RATIO
Creatinine, Urine: 77.7 mg/dL
Microalb/Creat Ratio: 9 mg/g{creat} (ref 0–29)
Microalbumin, Urine: 7.1 ug/mL

## 2024-04-18 LAB — HEPATIC FUNCTION PANEL
ALT: 14 IU/L (ref 0–44)
AST: 16 IU/L (ref 0–40)
Albumin: 3.6 g/dL — ABNORMAL LOW (ref 3.9–4.9)
Alkaline Phosphatase: 93 IU/L (ref 44–121)
Bilirubin Total: 0.3 mg/dL (ref 0.0–1.2)
Bilirubin, Direct: 0.12 mg/dL (ref 0.00–0.40)
Total Protein: 6.3 g/dL (ref 6.0–8.5)

## 2024-04-18 LAB — LIPID PANEL
Chol/HDL Ratio: 3.3 ratio (ref 0.0–5.0)
Cholesterol, Total: 116 mg/dL (ref 100–199)
HDL: 35 mg/dL — ABNORMAL LOW (ref >39)
LDL Chol Calc (NIH): 59 mg/dL (ref 0–99)
Triglycerides: 120 mg/dL (ref 0–149)
VLDL Cholesterol Cal: 22 mg/dL (ref 5–40)

## 2024-04-18 LAB — TSH: TSH: 1.1 u[IU]/mL (ref 0.450–4.500)

## 2024-04-18 LAB — HEMOGLOBIN A1C
Est. average glucose Bld gHb Est-mCnc: 151 mg/dL
Hgb A1c MFr Bld: 6.9 % — ABNORMAL HIGH (ref 4.8–5.6)

## 2024-04-19 ENCOUNTER — Encounter: Payer: Self-pay | Admitting: Nurse Practitioner

## 2024-04-19 ENCOUNTER — Ambulatory Visit: Admitting: Nurse Practitioner

## 2024-04-19 ENCOUNTER — Other Ambulatory Visit: Payer: Self-pay

## 2024-04-19 ENCOUNTER — Ambulatory Visit

## 2024-04-19 ENCOUNTER — Encounter: Payer: Self-pay | Admitting: Internal Medicine

## 2024-04-19 ENCOUNTER — Ambulatory Visit: Admitting: Internal Medicine

## 2024-04-19 ENCOUNTER — Ambulatory Visit: Payer: Self-pay | Admitting: Internal Medicine

## 2024-04-19 VITALS — BP 128/80 | HR 78 | Temp 97.9°F | Ht 73.0 in | Wt 282.6 lb

## 2024-04-19 VITALS — BP 130/76 | HR 70 | Temp 98.5°F | Ht 73.0 in | Wt 284.6 lb

## 2024-04-19 DIAGNOSIS — E669 Obesity, unspecified: Secondary | ICD-10-CM | POA: Diagnosis not present

## 2024-04-19 DIAGNOSIS — R5381 Other malaise: Secondary | ICD-10-CM

## 2024-04-19 DIAGNOSIS — E876 Hypokalemia: Secondary | ICD-10-CM

## 2024-04-19 DIAGNOSIS — I10 Essential (primary) hypertension: Secondary | ICD-10-CM

## 2024-04-19 DIAGNOSIS — Z7985 Long-term (current) use of injectable non-insulin antidiabetic drugs: Secondary | ICD-10-CM

## 2024-04-19 DIAGNOSIS — R21 Rash and other nonspecific skin eruption: Secondary | ICD-10-CM | POA: Diagnosis not present

## 2024-04-19 DIAGNOSIS — Z7984 Long term (current) use of oral hypoglycemic drugs: Secondary | ICD-10-CM

## 2024-04-19 DIAGNOSIS — R102 Pelvic and perineal pain: Secondary | ICD-10-CM

## 2024-04-19 DIAGNOSIS — R1031 Right lower quadrant pain: Secondary | ICD-10-CM

## 2024-04-19 DIAGNOSIS — E119 Type 2 diabetes mellitus without complications: Secondary | ICD-10-CM

## 2024-04-19 DIAGNOSIS — G4733 Obstructive sleep apnea (adult) (pediatric): Secondary | ICD-10-CM

## 2024-04-19 DIAGNOSIS — R1032 Left lower quadrant pain: Secondary | ICD-10-CM

## 2024-04-19 LAB — POCT URINALYSIS DIPSTICK
Bilirubin, UA: NEGATIVE
Blood, UA: NEGATIVE
Glucose, UA: NEGATIVE
Ketones, UA: NEGATIVE
Leukocytes, UA: NEGATIVE
Nitrite, UA: NEGATIVE
Protein, UA: NEGATIVE
Spec Grav, UA: 1.015 (ref 1.010–1.025)
Urobilinogen, UA: 0.2 U/dL
pH, UA: 6.5 (ref 5.0–8.0)

## 2024-04-19 MED ORDER — HYDROCHLOROTHIAZIDE 25 MG PO TABS: ORAL_TABLET | ORAL | 3 refills | Status: DC

## 2024-04-19 MED ORDER — TRIAMTERENE-HCTZ 37.5-25 MG PO TABS
1.0000 | ORAL_TABLET | Freq: Every day | ORAL | 3 refills | Status: AC
Start: 2024-04-19 — End: ?

## 2024-04-19 MED ORDER — HYDROCORTISONE 1 % EX OINT: 1.0000 | TOPICAL_OINTMENT | Freq: Two times a day (BID) | CUTANEOUS | 0 refills | Status: AC

## 2024-04-19 NOTE — Patient Instructions (Signed)

## 2024-04-19 NOTE — Progress Notes (Signed)
 Name: [name] MRN: [id number] DOB: 1961-05-29    CHIEF COMPLAINT:  Follow Up Assessment of Sleep Anea   HISTORY OF PRESENT ILLNESS: Follow up OSA assessment HST performed shows AHI 19   Discussed sleep data and reviewed with patient.  Encouraged proper weight management.  Discussed driving precautions and its relationship with hypersomnolence.  Discussed sleep hygiene, and benefits of a fixed sleep waked time.  The importance of getting eight or more hours of sleep discussed with patient.  Discussed limiting the use of the computer and television before bedtime.  Decrease naps during the day, so night time sleep will become enhanced.  Limit caffeine, and sleep deprivation.   Patient uses and benefits from therapy Using CPAP nightly and with naps Pressure setting is comfortable and is sleeping well. Auto CPAP 5-12 AHI reduced to 0.6 100% compliance Nasal cradle mask  No exacerbation at this time No evidence of heart failure at this time No evidence or signs of infection at this time No respiratory distress No fevers, chills, nausea, vomiting, diarrhea No evidence of lower extremity edema No evidence hemoptysis    PAST MEDICAL HISTORY :   has a past medical history of Controlled diabetes mellitus with peripheral circulatory disorder (HCC), Depression, Environmental allergies, Family history of colon cancer, GERD (gastroesophageal reflux disease), History of chicken pox, Hyperlipidemia, Hypertension, Venous stasis, and Vitamin D  deficiency.  has a past surgical history that includes Cholecystectomy (2002); Hernia repair [phone]); Colonoscopy with propofol  (N/A, 06/27/2015); and Colonoscopy with propofol  (N/A, 12/12/2020). Prior to Admission medications   Medication Sig Start Date End Date Taking? Authorizing Provider  amLODipine  (NORVASC ) 5 MG tablet TAKE 1 TABLET(5 MG) BY MOUTH DAILY 05/03/23   Dellar Fenton, MD  cholecalciferol (VITAMIN D ) 1000 units tablet Take  1,000 Units by mouth daily.    [provider]  Continuous Blood Gluc Sensor (DEXCOM G7 SENSOR) MISC APPLY 1 SENSOR TOPICALLY AS DIRECTED 02/01/23   Dellar Fenton, MD  hydrochlorothiazide  (HYDRODIURIL ) 25 MG tablet TAKE 1 TABLET(25 MG) BY MOUTH DAILY 10/06/22   Dellar Fenton, MD  lisinopril  (ZESTRIL ) 40 MG tablet Take 1 tablet (40 mg total) by mouth daily. 06/22/23   Dellar Fenton, MD  loratadine (CLARITIN) 10 MG tablet Take 10 mg by mouth daily as needed for allergies.     [provider]  metFORMIN  (GLUCOPHAGE ) 500 MG tablet Take one tablet in am with breakfast and two tablets with evening meal. 06/22/23   Dellar Fenton, MD  rosuvastatin  (CRESTOR ) 10 MG tablet Take 1 tablet (10 mg total) by mouth daily. 06/22/23   Dellar Fenton, MD  Semaglutide , 1 MG/DOSE, (OZEMPIC , 1 MG/DOSE,) 4 MG/3ML SOPN INJECT 1 MG UNDER THE SKIN ONCE A WEEK 06/22/23   Dellar Fenton, MD   No Known Allergies  FAMILY HISTORY:  family history includes Arthritis in his maternal grandfather, maternal grandmother, mother, paternal grandfather, and paternal grandmother; Breast cancer in his paternal grandmother; Diabetes in his father, mother, paternal grandfather, and sister; Heart disease in his father, maternal grandfather, and paternal grandfather; Hyperlipidemia in his father, maternal grandfather, maternal grandmother, and paternal grandfather; Hypertension in his father, maternal grandfather, maternal grandmother, mother, paternal grandfather, and paternal grandmother; Stomach cancer in his mother. SOCIAL HISTORY:  reports that he has never smoked. He has never used smokeless tobacco. He reports current alcohol use. He reports that he does not use drugs.  BP 130/76 (BP Location: Left Arm, Patient Position: Sitting, Cuff Size: Large)   Pulse 70   Temp 98.5  F (36.9 C) (Oral)   Ht 6\' 1"  (1.854 m)   Wt 284 lb 9.6 oz (129.1 kg)   SpO2 97%   BMI 37.55 kg/m       Review of Systems: Gen:   Denies  fever, sweats, chills weight loss  HEENT: Denies blurred vision, double vision, ear pain, eye pain, hearing loss, nose bleeds, sore throat Cardiac:  No dizziness, chest pain or heaviness, chest tightness,edema, No JVD Resp:   No cough, -sputum production, -shortness of breath,-wheezing, -hemoptysis,  Other:  All other systems negative   Physical Examination:   General Appearance: No distress  EYES PERRLA, EOM intact.   NECK Supple, No JVD Pulmonary: normal breath sounds, No wheezing.  CardiovascularNormal S1,S2.  No m/r/g.   Abdomen: Benign, Soft, non-tender. Neurology UE/LE 5/5 strength, no focal deficits Ext pulses intact, cap refill intact ALL OTHER ROS ARE NEGATIVE     ASSESSMENT AND PLAN SYNOPSIS   63 year old pleasant white male with underlying obstructive sleep apnea AHI of 19 follow-up assessment for OSA in the setting of morbid obesity and deconditioned state with high blood pressure and diabetes   Assessment of OSA Previous AHI 19 Continue CPAP as prescribed  Excellent compliance report Reviewed compliance report in detail with patient Patient definitely benefits the use of CPAP therapy as prescribed Using CPAP nightly and with naps Pressure setting is comfortable and is sleeping well. CPAP prescription  5-12 AHI reduced to 0.6  No evidence of acute heart failure at this time No respiratory distress No fevers, chills, nausea, vomiting, diarrhea No evidence hemoptysis  Patient Instructions Continue to use CPAP every night, minimum of 4-6 hours a night.  Change equipment every 30 days or as directed by DME.  Wash your tubing with warm soap and water daily, hang to dry. Wash humidifier portion weekly. Use bottled, distilled water and change daily   Be aware of reduced alertness and do not drive or operate heavy machinery if experiencing this or drowsiness.  Exercise encouraged, as tolerated. Encouraged proper weight management.  Important to get  eight or more hours of sleep  Limiting the use of the computer and television before bedtime.  Decrease naps during the day, so night time sleep will become enhanced.  Limit caffeine, and sleep deprivation.  HTN, stroke, uncontrolled diabetes and heart failure are potential risk factors.  Risk of untreated sleep apnea including cardiac arrhthymias, stroke, DM, pulm HTN.   Obesity -recommend significant weight loss -recommend changing diet On Ozempic   Deconditioned state -Recommend increased daily activity and exercise  Hypertension - Sleep apnea can contribute to hypertension, therefore treatment of sleep apnea is important part of hypertension management.  Diabetes Mellitis - Sleep apnea can contribute to DM, therefore treatment of sleep apnea is important part of DM management.    MEDICATION ADJUSTMENTS/LABS AND TESTS ORDERED: Continue CPAP as prescribed Recommend weight loss   CURRENT MEDICATIONS REVIEWED AT LENGTH WITH PATIENT TODAY   Patient  satisfied with Plan of action and management. All questions answered   Follow up 1 year   I spent a total of 45 minutes reviewing chart data, face-to-face evaluation with the patient, counseling and coordination of care as detailed above.      Lady Pier, M.D.  [facility]  Medical Director [facility] Medical Director [facility]

## 2024-04-19 NOTE — Progress Notes (Signed)
 Established Patient Office Visit  Subjective:  [name], male    DOB: 1961-02-20  Age: 63 y.o. MRN: [id number]  CC:  Chief Complaint  Patient presents with   Acute Visit    Groin pain x 2 weeks Feels as if he is straddling a horse    HPI  [name] presents for intermittent pain in the groin milligram for the last 2 weeks.  He denies any urinary difficulties, testicular pain, fever, discharges, painful urination, any lump, swelling or injury.  Normal range of motion of both legs.  He has history of constipation.  HPI   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)    Pt states controlled by diet for last 4 years   Depression    Environmental allergies    Family history of colon cancer    GERD (gastroesophageal reflux disease)    History of chicken pox    Hyperlipidemia    Hypertension    Venous stasis    Vitamin D  deficiency     Past Surgical History:  Procedure Laterality Date   CHOLECYSTECTOMY  2002   COLONOSCOPY WITH PROPOFOL  N/A 06/27/2015   Procedure: COLONOSCOPY WITH PROPOFOL ;  Surgeon: Gladis RAYMOND Mariner, MD;  Location: [facility] ENDOSCOPY;  Service: Endoscopy;  Laterality: N/A;   COLONOSCOPY WITH PROPOFOL  N/A 12/12/2020   Procedure: COLONOSCOPY WITH PROPOFOL ;  Surgeon: Maryruth Ole ONEIDA, MD;  Location: [facility] ENDOSCOPY;  Service: Endoscopy;  Laterality: N/A;   HERNIA REPAIR  1962    Family History  Problem Relation Age of Onset   Stomach cancer Mother    Arthritis Mother    Diabetes Mother    Hypertension Mother    Hyperlipidemia Father    Heart disease Father    Diabetes Father    Hypertension Father    Arthritis Maternal Grandmother    Hyperlipidemia Maternal Grandmother    Hypertension Maternal Grandmother    Arthritis Maternal Grandfather    Hyperlipidemia Maternal Grandfather    Heart disease Maternal Grandfather    Hypertension Maternal Grandfather     Arthritis Paternal Grandmother    Breast cancer Paternal Grandmother    Hypertension Paternal Grandmother    Arthritis Paternal Grandfather    Hyperlipidemia Paternal Grandfather    Heart disease Paternal Grandfather    Diabetes Paternal Grandfather    Hypertension Paternal Grandfather    Diabetes Sister     Social History   Socioeconomic History   Marital status: Widowed    Spouse name: Not on file   Number of children: Not on file   Years of education: Not on file   Highest education level: Bachelor's degree (e.g., BA, AB, BS)  Occupational History   Not on file  Tobacco Use   Smoking status: Never   Smokeless tobacco: Never  Vaping Use   Vaping status: Never Used  Substance and Sexual Activity   Alcohol use: Yes    Alcohol/week: 0.0 - 1.0 standard drinks of alcohol    Comment: 1 drink every 2 months   Drug use: No   Sexual activity: Not on file  Other Topics Concern   Not on file  Social History Narrative   Not on file   Social Drivers of Health   Financial Resource Strain: Low Risk  (04/18/2024)   Overall Financial Resource Strain (CARDIA)    Difficulty of Paying Living Expenses: Not hard at all  Food Insecurity: No Food Insecurity (04/18/2024)  Hunger Vital Sign    Worried About Running Out of Food in the Last Year: Never true    Ran Out of Food in the Last Year: Never true  Transportation Needs: No Transportation Needs (04/18/2024)   PRAPARE - Administrator, Civil Service (Medical): No    Lack of Transportation (Non-Medical): No  Physical Activity: Unknown (04/18/2024)   Exercise Vital Sign    Days of Exercise per Week: 0 days    Minutes of Exercise per Session: Not on file  Stress: No Stress Concern Present (04/18/2024)   [employer] of Occupational Health - Occupational Stress Questionnaire    Feeling of Stress : Only a little  Social Connections: Socially Integrated (04/18/2024)   Social Connection and Isolation Panel    Frequency of  Communication with Friends and Family: Twice a week    Frequency of Social Gatherings with Friends and Family: Once a week    Attends Religious Services: 1 to 4 times per year    Active Member of [employer] or Organizations: Yes    Attends Engineer, structural: More than 4 times per year    Marital Status: Living with partner  Intimate Partner Violence: Not on file     Outpatient Medications Prior to Visit  Medication Sig Dispense Refill   amLODipine  (NORVASC ) 5 MG tablet TAKE 1 TABLET(5 MG) BY MOUTH DAILY 90 tablet 2   cholecalciferol (VITAMIN D ) 1000 units tablet Take 1,000 Units by mouth daily.     Continuous Glucose Sensor (DEXCOM G7 SENSOR) MISC APPLY 1 SENSOR TO SKIN AS DIRECTED, CHANGE EVERY 10 DAYS 3 each 11   lisinopril  (ZESTRIL ) 40 MG tablet Take 1 tablet (40 mg total) by mouth daily. 90 tablet 3   loratadine (CLARITIN) 10 MG tablet Take 10 mg by mouth daily as needed for allergies.      metFORMIN  (GLUCOPHAGE ) 500 MG tablet TAKE 1 TABLET BY MOUTH IN THE  MORNING WITH BREAKFAST AND 2  TABLETS WITH EVENING MEAL 270 tablet 3   OZEMPIC , 1 MG/DOSE, 4 MG/3ML SOPN INJECT 1 MG UNDER THE SKIN ONCE A WEEK 9 mL 1   rosuvastatin  (CRESTOR ) 10 MG tablet TAKE 1 TABLET BY MOUTH EVERY DAY 90 tablet 3   triamterene -hydrochlorothiazide  (MAXZIDE-25) 37.5-25 MG tablet Take 1 tablet by mouth daily. 90 tablet 3   hydrochlorothiazide  (HYDRODIURIL ) 25 MG tablet TAKE 1 TABLET(25 MG) BY MOUTH DAILY 90 tablet 3   No facility-administered medications prior to visit.    No Known Allergies  ROS Review of Systems Negative unless indicated in HPI.    Objective:     Physical Exam Constitutional:      Appearance: Normal appearance. He is obese.   Cardiovascular:     Rate and Rhythm: Normal rate and regular rhythm.     Pulses: Normal pulses.     Heart sounds: Normal heart sounds.  Pulmonary:     Effort: Pulmonary effort is normal.  Abdominal:     Tenderness: There is no abdominal tenderness.  There is no rebound.   Musculoskeletal:        General: Normal range of motion.   Skin:    Findings: Rash (Bilaterally groin area) present.   Neurological:     General: No focal deficit present.     Mental Status: He is alert and oriented to person, place, and time.   Hyperdrive  BP 128/80   Pulse 78   Temp 97.9 F (36.6 C)   Ht 6' 1 (1.854  m)   Wt 282 lb 9.6 oz (128.2 kg)   SpO2 96%   BMI 37.28 kg/m  Wt Readings from Last 3 Encounters:  04/19/24 282 lb 9.6 oz (128.2 kg)  04/19/24 284 lb 9.6 oz (129.1 kg)  03/19/24 290 lb (131.5 kg)     Health Maintenance  Topic Date Due   HIV Screening  Never done   Hepatitis C Screening  Never done   DTaP/Tdap/Td (1 - Tdap) Never done   Zoster Vaccines- Shingrix (1 of 2) Never done   COVID-19 Vaccine (3 - 2024-25 season) 07/24/2023   Pneumococcal Vaccine 80-25 Years old (2 of 2 - PCV) 01/23/2025 (Originally 10/31/2019)   INFLUENZA VACCINE  06/22/2024   FOOT EXAM  09/21/2024   OPHTHALMOLOGY EXAM  09/27/2024   HEMOGLOBIN A1C  10/18/2024   Diabetic kidney evaluation - eGFR measurement  04/17/2025   Diabetic kidney evaluation - Urine ACR  04/17/2025   Colonoscopy  12/12/2025   Hepatitis B Vaccines  Aged Out   HPV VACCINES  Aged Out   Meningococcal B Vaccine  Aged Out    There are no preventive care reminders to display for this patient.  Lab Results  Component Value Date   TSH 1.100 04/17/2024   Lab Results  Component Value Date   WBC 9.3 06/09/2023   HGB 13.9 06/09/2023   HCT 41.9 06/09/2023   MCV 87 06/09/2023   PLT 340 06/09/2023   Lab Results  Component Value Date   NA 142 04/17/2024   K 3.3 (L) 04/17/2024   CO2 24 04/17/2024   GLUCOSE 142 (H) 04/17/2024   BUN 17 04/17/2024   CREATININE 0.79 04/17/2024   BILITOT 0.3 04/17/2024   ALKPHOS 93 04/17/2024   AST 16 04/17/2024   ALT 14 04/17/2024   PROT 6.3 04/17/2024   ALBUMIN 3.6 (L) 04/17/2024   CALCIUM  8.8 04/17/2024   EGFR 100 04/17/2024   Lab Results   Component Value Date   CHOL 116 04/17/2024   Lab Results  Component Value Date   HDL 35 (L) 04/17/2024   Lab Results  Component Value Date   LDLCALC 59 04/17/2024   Lab Results  Component Value Date   TRIG 120 04/17/2024   Lab Results  Component Value Date   CHOLHDL 3.3 04/17/2024   Lab Results  Component Value Date   HGBA1C 6.9 (H) 04/17/2024      Assessment & Plan:  Bilateral groin pain Assessment & Plan: Intermittent groin pain from last 2 weeks.  History of constipation.  No pain physical examination. -We performed plain x-ray of the abdomen.   Suprapubic pain -     POCT urinalysis dipstick -     DG Abd 1 View  Rash Assessment & Plan: Erythematous, raw and shiny rash to the skin folds. -Advised patient to use hydrocortisone  cream. -Advised to keep the area clean and dry. - Will continue to monitor.   Other orders -     Hydrocortisone ; Apply 1 Application topically 2 (two) times daily.  Dispense: 30 g; Refill: 0    Follow-up: No follow-ups on file.   Valma Rotenberg, NP

## 2024-04-27 ENCOUNTER — Encounter: Admitting: Internal Medicine

## 2024-04-30 ENCOUNTER — Telehealth: Payer: Self-pay | Admitting: Internal Medicine

## 2024-04-30 DIAGNOSIS — E876 Hypokalemia: Secondary | ICD-10-CM

## 2024-04-30 NOTE — Telephone Encounter (Signed)
 Patient need labs ordered please

## 2024-05-04 ENCOUNTER — Other Ambulatory Visit

## 2024-05-04 NOTE — Addendum Note (Signed)
 Addended by: Lindle Rhea on: 05/04/2024 08:58 AM   Modules accepted: Orders

## 2024-05-20 DIAGNOSIS — R102 Pelvic and perineal pain: Secondary | ICD-10-CM | POA: Insufficient documentation

## 2024-05-20 DIAGNOSIS — R1031 Right lower quadrant pain: Secondary | ICD-10-CM | POA: Insufficient documentation

## 2024-05-20 NOTE — Progress Notes (Incomplete)
 Established Patient Office Visit  Subjective:  [name], male    DOB: Jan 03, 1961  Age: 63 y.o. MRN: [id number]  CC:  Chief Complaint  Patient presents with  . Acute Visit    Groin pain x 2 weeks Feels as if he is straddling a horse    HPI  [name] presents for intermittent pain in the groin milligram for the last 2 weeks.  He denies any urinary difficulties, testicular pain, fever, discharges, painful urination, any lump, swelling or injury.  Normal range of motion of both legs.  He has history of constipation.  HPI   Past Medical History:  Diagnosis Date  . 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 Depression   . Environmental allergies   . Family history of colon cancer   . GERD (gastroesophageal reflux disease)   . History of chicken pox   . Hyperlipidemia   . Hypertension   . Venous stasis   . Vitamin D  deficiency     Past Surgical History:  Procedure Laterality Date  . CHOLECYSTECTOMY  2002  . COLONOSCOPY WITH PROPOFOL  N/A 06/27/2015   Procedure: COLONOSCOPY WITH PROPOFOL ;  Surgeon: Gladis RAYMOND Mariner, MD;  Location: [facility] ENDOSCOPY;  Service: Endoscopy;  Laterality: N/A;  . COLONOSCOPY WITH PROPOFOL  N/A 12/12/2020   Procedure: COLONOSCOPY WITH PROPOFOL ;  Surgeon: Maryruth Ole ONEIDA, MD;  Location: [facility] ENDOSCOPY;  Service: Endoscopy;  Laterality: N/A;  . HERNIA REPAIR  1962    Family History  Problem Relation Age of Onset  . Stomach cancer Mother   . Arthritis Mother   . Diabetes Mother   . Hypertension Mother   . Hyperlipidemia Father   . Heart disease Father   . Diabetes Father   . Hypertension Father   . Arthritis Maternal Grandmother   . Hyperlipidemia Maternal Grandmother   . Hypertension Maternal Grandmother   . Arthritis Maternal Grandfather   . Hyperlipidemia Maternal Grandfather   . Heart disease Maternal Grandfather   .  Hypertension Maternal Grandfather   . Arthritis Paternal Grandmother   . Breast cancer Paternal Grandmother   . Hypertension Paternal Grandmother   . Arthritis Paternal Grandfather   . Hyperlipidemia Paternal Grandfather   . Heart disease Paternal Grandfather   . Diabetes Paternal Grandfather   . Hypertension Paternal Grandfather   . Diabetes Sister     Social History   Socioeconomic History  . Marital status: Widowed    Spouse name: Not on file  . Number of children: Not on file  . Years of education: Not on file  . Highest education level: Bachelor's degree (e.g., BA, AB, BS)  Occupational History  . Not on file  Tobacco Use  . Smoking status: Never  . Smokeless tobacco: Never  Vaping Use  . Vaping status: Never Used  Substance and Sexual Activity  . Alcohol use: Yes    Alcohol/week: 0.0 - 1.0 standard drinks of alcohol    Comment: 1 drink every 2 months  . Drug use: No  . Sexual activity: Not on file  Other Topics Concern  . Not on file  Social History Narrative  . Not on file   Social Drivers of Health   Financial Resource Strain: Low Risk  (04/18/2024)   Overall Financial Resource Strain (CARDIA)   . Difficulty of Paying Living Expenses: Not hard at all  Food Insecurity: No Food Insecurity (04/18/2024)  Hunger Vital Sign   . Worried About Programme researcher, broadcasting/film/video in the Last Year: Never true   . Ran Out of Food in the Last Year: Never true  Transportation Needs: No Transportation Needs (04/18/2024)   PRAPARE - Transportation   . Lack of Transportation (Medical): No   . Lack of Transportation (Non-Medical): No  Physical Activity: Unknown (04/18/2024)   Exercise Vital Sign   . Days of Exercise per Week: 0 days   . Minutes of Exercise per Session: Not on file  Stress: No Stress Concern Present (04/18/2024)   [employer] of Occupational Health - Occupational Stress Questionnaire   . Feeling of Stress : Only a little  Social Connections: Socially Integrated  (04/18/2024)   Social Connection and Isolation Panel [NHANES]   . Frequency of Communication with Friends and Family: Twice a week   . Frequency of Social Gatherings with Friends and Family: Once a week   . Attends Religious Services: 1 to 4 times per year   . Active Member of Clubs or Organizations: Yes   . Attends Banker Meetings: More than 4 times per year   . Marital Status: Living with partner  Intimate Partner Violence: Not on file     Outpatient Medications Prior to Visit  Medication Sig Dispense Refill  . amLODipine  (NORVASC ) 5 MG tablet TAKE 1 TABLET(5 MG) BY MOUTH DAILY 90 tablet 2  . cholecalciferol (VITAMIN D ) 1000 units tablet Take 1,000 Units by mouth daily.    . Continuous Glucose Sensor (DEXCOM G7 SENSOR) MISC APPLY 1 SENSOR TO SKIN AS DIRECTED, CHANGE EVERY 10 DAYS 3 each 11  . lisinopril  (ZESTRIL ) 40 MG tablet Take 1 tablet (40 mg total) by mouth daily. 90 tablet 3  . loratadine (CLARITIN) 10 MG tablet Take 10 mg by mouth daily as needed for allergies.     . metFORMIN  (GLUCOPHAGE ) 500 MG tablet TAKE 1 TABLET BY MOUTH IN THE  MORNING WITH BREAKFAST AND 2  TABLETS WITH EVENING MEAL 270 tablet 3  . OZEMPIC , 1 MG/DOSE, 4 MG/3ML SOPN INJECT 1 MG UNDER THE SKIN ONCE A WEEK 9 mL 1  . rosuvastatin  (CRESTOR ) 10 MG tablet TAKE 1 TABLET BY MOUTH EVERY DAY 90 tablet 3  . triamterene -hydrochlorothiazide  (MAXZIDE-25) 37.5-25 MG tablet Take 1 tablet by mouth daily. 90 tablet 3  . hydrochlorothiazide  (HYDRODIURIL ) 25 MG tablet TAKE 1 TABLET(25 MG) BY MOUTH DAILY 90 tablet 3   No facility-administered medications prior to visit.    No Known Allergies  ROS Review of Systems Negative unless indicated in HPI.    Objective:     Physical Exam Constitutional:      Appearance: Normal appearance. He is obese.   Cardiovascular:     Rate and Rhythm: Normal rate and regular rhythm.     Pulses: Normal pulses.     Heart sounds: Normal heart sounds.  Pulmonary:      Effort: Pulmonary effort is normal.  Abdominal:     Tenderness: There is no abdominal tenderness. There is no rebound.   Musculoskeletal:        General: Normal range of motion.   Skin:    Findings: Rash (Bilaterally groin area) present.   Neurological:     General: No focal deficit present.     Mental Status: He is alert and oriented to person, place, and time.     BP 128/80   Pulse 78   Temp 97.9 F (36.6 C)   Ht 6' 1 (  1.854 m)   Wt 282 lb 9.6 oz (128.2 kg)   SpO2 96%   BMI 37.28 kg/m  Wt Readings from Last 3 Encounters:  04/19/24 282 lb 9.6 oz (128.2 kg)  04/19/24 284 lb 9.6 oz (129.1 kg)  03/19/24 290 lb (131.5 kg)     Health Maintenance  Topic Date Due  . HIV Screening  Never done  . Hepatitis C Screening  Never done  . DTaP/Tdap/Td (1 - Tdap) Never done  . Zoster Vaccines- Shingrix (1 of 2) Never done  . COVID-19 Vaccine (3 - 2024-25 season) 05/05/2024 (Originally 07/24/2023)  . Pneumococcal Vaccine 85-32 Years old (2 of 2 - PCV) 01/23/2025 (Originally 10/31/2019)  . INFLUENZA VACCINE  06/22/2024  . FOOT EXAM  09/21/2024  . OPHTHALMOLOGY EXAM  09/27/2024  . HEMOGLOBIN A1C  10/18/2024  . Diabetic kidney evaluation - eGFR measurement  04/17/2025  . Diabetic kidney evaluation - Urine ACR  04/17/2025  . Colonoscopy  12/12/2025  . HPV VACCINES  Aged Out  . Meningococcal B Vaccine  Aged Out    There are no preventive care reminders to display for this patient.  Lab Results  Component Value Date   TSH 1.100 04/17/2024   Lab Results  Component Value Date   WBC 9.3 06/09/2023   HGB 13.9 06/09/2023   HCT 41.9 06/09/2023   MCV 87 06/09/2023   PLT 340 06/09/2023   Lab Results  Component Value Date   NA 142 04/17/2024   K 3.3 (L) 04/17/2024   CO2 24 04/17/2024   GLUCOSE 142 (H) 04/17/2024   BUN 17 04/17/2024   CREATININE 0.79 04/17/2024   BILITOT 0.3 04/17/2024   ALKPHOS 93 04/17/2024   AST 16 04/17/2024   ALT 14 04/17/2024   PROT 6.3 04/17/2024    ALBUMIN 3.6 (L) 04/17/2024   CALCIUM  8.8 04/17/2024   EGFR 100 04/17/2024   Lab Results  Component Value Date   CHOL 116 04/17/2024   Lab Results  Component Value Date   HDL 35 (L) 04/17/2024   Lab Results  Component Value Date   LDLCALC 59 04/17/2024   Lab Results  Component Value Date   TRIG 120 04/17/2024   Lab Results  Component Value Date   CHOLHDL 3.3 04/17/2024   Lab Results  Component Value Date   HGBA1C 6.9 (H) 04/17/2024      Assessment & Plan:  There are no diagnoses linked to this encounter.  Follow-up: No follow-ups on file.   Inocente Krach, NP

## 2024-05-21 NOTE — Assessment & Plan Note (Signed)
 Erythematous, raw and shiny rash to the skin folds. -Advised patient to use hydrocortisone  cream. -Advised to keep the area clean and dry. - Will continue to monitor.

## 2024-05-21 NOTE — Assessment & Plan Note (Signed)
 Intermittent groin pain from last 2 weeks.  History of constipation.  No pain physical examination. -We performed plain x-ray of the abdomen.

## 2024-05-31 ENCOUNTER — Other Ambulatory Visit: Payer: Self-pay | Admitting: Internal Medicine

## 2024-06-04 ENCOUNTER — Encounter: Admitting: Internal Medicine

## 2024-06-15 ENCOUNTER — Encounter: Payer: Self-pay | Admitting: Internal Medicine

## 2024-06-15 ENCOUNTER — Ambulatory Visit (INDEPENDENT_AMBULATORY_CARE_PROVIDER_SITE_OTHER): Admitting: Internal Medicine

## 2024-06-15 VITALS — BP 118/72 | HR 82 | Resp 16 | Ht 73.0 in | Wt 296.4 lb

## 2024-06-15 DIAGNOSIS — Z7985 Long-term (current) use of injectable non-insulin antidiabetic drugs: Secondary | ICD-10-CM

## 2024-06-15 DIAGNOSIS — G4730 Sleep apnea, unspecified: Secondary | ICD-10-CM

## 2024-06-15 DIAGNOSIS — Z0000 Encounter for general adult medical examination without abnormal findings: Secondary | ICD-10-CM

## 2024-06-15 DIAGNOSIS — E1165 Type 2 diabetes mellitus with hyperglycemia: Secondary | ICD-10-CM

## 2024-06-15 DIAGNOSIS — I10 Essential (primary) hypertension: Secondary | ICD-10-CM | POA: Diagnosis not present

## 2024-06-15 DIAGNOSIS — Z800 Family history of malignant neoplasm of digestive organs: Secondary | ICD-10-CM

## 2024-06-15 DIAGNOSIS — Z860100 Personal history of colon polyps, unspecified: Secondary | ICD-10-CM

## 2024-06-15 DIAGNOSIS — Z125 Encounter for screening for malignant neoplasm of prostate: Secondary | ICD-10-CM

## 2024-06-15 DIAGNOSIS — E559 Vitamin D deficiency, unspecified: Secondary | ICD-10-CM

## 2024-06-15 DIAGNOSIS — E7800 Pure hypercholesterolemia, unspecified: Secondary | ICD-10-CM

## 2024-06-15 DIAGNOSIS — Z7984 Long term (current) use of oral hypoglycemic drugs: Secondary | ICD-10-CM

## 2024-06-15 DIAGNOSIS — R600 Localized edema: Secondary | ICD-10-CM

## 2024-06-15 DIAGNOSIS — D649 Anemia, unspecified: Secondary | ICD-10-CM

## 2024-06-15 NOTE — Assessment & Plan Note (Signed)
 Continue lisinopril  and hydrochlorothiazide  and amlodipine .  Pressure as outlined.  No changes to medication today.  Follow pressures.

## 2024-06-15 NOTE — Assessment & Plan Note (Signed)
 Follow cbc.

## 2024-06-15 NOTE — Assessment & Plan Note (Signed)
 On Crestor .  Low-cholesterol diet and exercise.  Follow lipid panel.

## 2024-06-15 NOTE — Assessment & Plan Note (Signed)
Colonoscopy 12/12/20 - polyp hepatic flexure, polyp - rectum and internal hemorrhoids. Recommended f/u in 7 years.   

## 2024-06-15 NOTE — Assessment & Plan Note (Addendum)
 Continues using cpap nightly and benefiting from use. Had f/u with pulmonary 03/2024 as outlined. Benefiting from use.

## 2024-06-15 NOTE — Assessment & Plan Note (Signed)
 Colonoscopy 11/2020.  Recommended follow-up colonoscopy in 7 years.

## 2024-06-15 NOTE — Assessment & Plan Note (Signed)
Improved.  Follow.  Compression hose.

## 2024-06-15 NOTE — Assessment & Plan Note (Signed)
Check vitamin D with next labs.

## 2024-06-15 NOTE — Assessment & Plan Note (Signed)
 Currently on Ozempic  and metformin .  Blood sugars reported as outlined.  Have discussed diet and exercise.  Follow met b and A1c. No changes in medication today.  Continue low-carb diet.

## 2024-06-15 NOTE — Progress Notes (Signed)
 Subjective:    [name], male    DOB: 06-Jan-1961, 63 y.o.   MRN: [id number]  Patient here for  Chief Complaint  Patient presents with   Annual Exam    HPI Here for a physical exam. Continues on lisinopril  and hydrochlorothiazide  and amlodipine . Blood sugars average in the organs have been running around 155-180.  Reviewed his continuous glucose monitor.  His 90-day average is 168.  30-day average 175.  We discussed low-carb diet and exercise. Continues on ozempic  and metformin .  Will hold on making changes to medication.  Had f/u with pulmonary 04/19/24 - using cpap regularly.  Benefiting from use.  No chest pain or shortness of breath.  We did discuss his weight.  He is weighing regularly at home.  His weight last week was 288 pounds.  2 days ago was 286 pounds at home.  He has gone away at home to see how it correlates with higher scale.  Again no shortness of breath.  No increased swelling.  Bowels are doing better.  No abdominal pressure.   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)    Pt states controlled by diet for last 4 years   Depression    Environmental allergies    Family history of colon cancer    GERD (gastroesophageal reflux disease)    History of chicken pox    Hyperlipidemia    Hypertension    Venous stasis    Vitamin D  deficiency    Past Surgical History:  Procedure Laterality Date   CHOLECYSTECTOMY  2002   COLONOSCOPY WITH PROPOFOL  N/A 06/27/2015   Procedure: COLONOSCOPY WITH PROPOFOL ;  Surgeon: Gladis RAYMOND Mariner, MD;  Location: [facility] ENDOSCOPY;  Service: Endoscopy;  Laterality: N/A;   COLONOSCOPY WITH PROPOFOL  N/A 12/12/2020   Procedure: COLONOSCOPY WITH PROPOFOL ;  Surgeon: Maryruth Ole ONEIDA, MD;  Location: [facility] ENDOSCOPY;  Service: Endoscopy;  Laterality: N/A;   HERNIA REPAIR  1962   Family History  Problem Relation Age of Onset   Stomach cancer Mother    Arthritis  Mother    Diabetes Mother    Hypertension Mother    Hyperlipidemia Father    Heart disease Father    Diabetes Father    Hypertension Father    Arthritis Maternal Grandmother    Hyperlipidemia Maternal Grandmother    Hypertension Maternal Grandmother    Arthritis Maternal Grandfather    Hyperlipidemia Maternal Grandfather    Heart disease Maternal Grandfather    Hypertension Maternal Grandfather    Arthritis Paternal Grandmother    Breast cancer Paternal Grandmother    Hypertension Paternal Grandmother    Arthritis Paternal Grandfather    Hyperlipidemia Paternal Grandfather    Heart disease Paternal Grandfather    Diabetes Paternal Grandfather    Hypertension Paternal Grandfather    Diabetes Sister    Social History   Socioeconomic History   Marital status: Widowed    Spouse name: Not on file   Number of children: Not on file   Years of education: Not on file   Highest education level: Bachelor's degree (e.g., BA, AB, BS)  Occupational History   Not on file  Tobacco Use   Smoking status: Never   Smokeless tobacco: Never  Vaping Use   Vaping status: Never Used  Substance and Sexual Activity   Alcohol use: Yes    Alcohol/week: 0.0 - 1.0 standard drinks of alcohol    Comment: 1 drink  every 2 months   Drug use: No   Sexual activity: Not on file  Other Topics Concern   Not on file  Social History Narrative   Not on file   Social Drivers of Health   Financial Resource Strain: Low Risk  (04/18/2024)   Overall Financial Resource Strain (CARDIA)    Difficulty of Paying Living Expenses: Not hard at all  Food Insecurity: No Food Insecurity (04/18/2024)   Hunger Vital Sign    Worried About Running Out of Food in the Last Year: Never true    Ran Out of Food in the Last Year: Never true  Transportation Needs: No Transportation Needs (04/18/2024)   PRAPARE - Administrator, Civil Service (Medical): No    Lack of Transportation (Non-Medical): No  Physical  Activity: Unknown (04/18/2024)   Exercise Vital Sign    Days of Exercise per Week: 0 days    Minutes of Exercise per Session: Not on file  Stress: No Stress Concern Present (04/18/2024)   [employer] of Occupational Health - Occupational Stress Questionnaire    Feeling of Stress : Only a little  Social Connections: Socially Integrated (04/18/2024)   Social Connection and Isolation Panel    Frequency of Communication with Friends and Family: Twice a week    Frequency of Social Gatherings with Friends and Family: Once a week    Attends Religious Services: 1 to 4 times per year    Active Member of [employer] or Organizations: Yes    Attends Engineer, structural: More than 4 times per year    Marital Status: Living with partner     Review of Systems  Constitutional:  Negative for appetite change and unexpected weight change.  HENT:  Negative for congestion and sinus pressure.   Respiratory:  Negative for cough, chest tightness and shortness of breath.   Cardiovascular:  Negative for chest pain and palpitations.       No increased swelling.   Gastrointestinal:  Negative for abdominal pain, diarrhea, nausea and vomiting.  Genitourinary:  Negative for difficulty urinating and dysuria.  Skin:  Negative for rash.       Stasis changes lower extremities.   Neurological:  Negative for dizziness and headaches.  Psychiatric/Behavioral:  Negative for agitation and dysphoric mood.        Objective:     BP 118/72   Pulse 82   Resp 16   Ht 6' 1 (1.854 m)   Wt 296 lb 6.4 oz (134.4 kg)   SpO2 98%   BMI 39.11 kg/m  Wt Readings from Last 3 Encounters:  06/15/24 296 lb 6.4 oz (134.4 kg)  04/19/24 282 lb 9.6 oz (128.2 kg)  04/19/24 284 lb 9.6 oz (129.1 kg)    Physical Exam Constitutional:      General: He is not in acute distress.    Appearance: Normal appearance. He is well-developed.  HENT:     Head: Normocephalic and atraumatic.     Right Ear: External ear normal.     Left  Ear: External ear normal.     Mouth/Throat:     Pharynx: No oropharyngeal exudate or posterior oropharyngeal erythema.  Eyes:     General: No scleral icterus.       Right eye: No discharge.        Left eye: No discharge.     Conjunctiva/sclera: Conjunctivae normal.  Neck:     Thyroid: No thyromegaly.  Cardiovascular:     Rate and Rhythm:  Normal rate and regular rhythm.  Pulmonary:     Effort: No respiratory distress.     Breath sounds: Normal breath sounds. No wheezing.  Abdominal:     General: Bowel sounds are normal.     Palpations: Abdomen is soft.     Tenderness: There is no abdominal tenderness.  Musculoskeletal:        General: No swelling or tenderness.     Cervical back: Neck supple. No tenderness.  Lymphadenopathy:     Cervical: No cervical adenopathy.  Skin:    Comments: Stasis changes - lower extremities.   Neurological:     Mental Status: He is alert and oriented to person, place, and time.  Psychiatric:        Mood and Affect: Mood normal.        Behavior: Behavior normal.      Diabetic foot exam was performed with the following findings:   No deformities, ulcerations, or other skin breakdown Normal sensation of 10g monofilament Intact posterior tibialis and dorsalis pedis pulses      Outpatient Encounter Medications as of 06/15/2024  Medication Sig   amLODipine  (NORVASC ) 5 MG tablet TAKE 1 TABLET(5 MG) BY MOUTH DAILY   cholecalciferol (VITAMIN D ) 1000 units tablet Take 1,000 Units by mouth daily.   Continuous Glucose Sensor (DEXCOM G7 SENSOR) MISC APPLY 1 SENSOR TO SKIN AS DIRECTED, CHANGE EVERY 10 DAYS   hydrocortisone  1 % ointment Apply 1 Application topically 2 (two) times daily.   lisinopril  (ZESTRIL ) 40 MG tablet TAKE 1 TABLET BY MOUTH DAILY   loratadine (CLARITIN) 10 MG tablet Take 10 mg by mouth daily as needed for allergies.    metFORMIN  (GLUCOPHAGE ) 500 MG tablet TAKE 1 TABLET BY MOUTH IN THE  MORNING WITH BREAKFAST AND 2  TABLETS WITH EVENING  MEAL   OZEMPIC , 1 MG/DOSE, 4 MG/3ML SOPN INJECT 1 MG UNDER THE SKIN ONCE A WEEK   rosuvastatin  (CRESTOR ) 10 MG tablet TAKE 1 TABLET BY MOUTH DAILY   triamterene -hydrochlorothiazide  (MAXZIDE-25) 37.5-25 MG tablet Take 1 tablet by mouth daily.   No facility-administered encounter medications on file as of 06/15/2024.     Lab Results  Component Value Date   WBC 9.3 06/09/2023   HGB 13.9 06/09/2023   HCT 41.9 06/09/2023   PLT 340 06/09/2023   GLUCOSE 142 (H) 04/17/2024   CHOL 116 04/17/2024   TRIG 120 04/17/2024   HDL 35 (L) 04/17/2024   LDLCALC 59 04/17/2024   ALT 14 04/17/2024   AST 16 04/17/2024   NA 142 04/17/2024   K 3.3 (L) 04/17/2024   CL 101 04/17/2024   CREATININE 0.79 04/17/2024   BUN 17 04/17/2024   CO2 24 04/17/2024   TSH 1.100 04/17/2024   HGBA1C 6.9 (H) 04/17/2024    DG Abd 1 View Result Date: 06/09/2023 CLINICAL DATA:  Abdominal pain EXAM: ABDOMEN - 1 VIEW COMPARISON:  None Available. FINDINGS: Bowel gas pattern is nonspecific. Moderate to large amount of stool is seen in colon, mostly in the ascending colon. There is no small bowel dilation. Surgical clips are seen in gallbladder fossa. Degenerative changes are noted in thoracic and lumbar spine. No abnormal masses or calcifications are seen. Kidneys are partly obscured by bowel contents. IMPRESSION: Nonspecific bowel gas pattern. Moderate to large stool burden in colon, more so in ascending colon. Electronically Signed   By: Gearldine Mary M.D.   On: 06/09/2023 14:41   DG Lumbar Spine 2-3 Views Result Date: 06/09/2023 CLINICAL DATA:  Back  pain, no recent trauma. EXAM: LUMBAR SPINE - 2-3 VIEW COMPARISON:  None Available. FINDINGS: There is no evidence of lumbar spine fracture. Mild anterior vertebral body height loss suggesting mild compression deformity of L1. Straightening of the lumbar spine. Mild multilevel degenerate disc disease. IMPRESSION: Mild anterior vertebral body height loss suggesting mild compression  deformity of L1, age indeterminate. 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 Electronically Signed   By: Imran  Ahmed D.O.   On: 06/09/2023 14:38   DG Thoracic Spine 2 View Result Date: 06/09/2023 CLINICAL DATA:  Increased back pain.  No injury. EXAM: THORACIC SPINE 2 VIEWS COMPARISON:  None Available. FINDINGS: There is no evidence of thoracic spine fracture. Alignment is normal. Mild multilevel degenerate disc disease with disc height loss and marginal spurring. IMPRESSION: Mild multilevel degenerative disc disease. Electronically Signed   By: Imran  Ahmed D.O.   On: 06/09/2023 14:35       Assessment & Plan:  Routine general medical examination at a health care facility  Healthcare maintenance Assessment & Plan: Physical today 06/15/24.  Colonoscopy 11/2020 as outlined.  PSA 07/05/22 - .5. Overdue. Check with next labs.    Type 2 diabetes mellitus with hyperglycemia, without long-term current use of insulin (HCC) Assessment & Plan: Currently on Ozempic  and metformin .  Blood sugars reported as outlined.  Have discussed diet and exercise.  Follow met b and A1c. No changes in medication today.  Continue low-carb diet.  Orders: -     Basic metabolic panel with GFR -     Microalbumin / creatinine urine ratio -     Hemoglobin A1c  Sleep apnea, unspecified type Assessment & Plan: Continues using cpap nightly and benefiting from use. Had f/u with pulmonary 03/2024 as outlined. Benefiting from use.    Hypercholesterolemia Assessment & Plan: On Crestor .  Low-cholesterol diet and exercise.  Follow lipid panel.   Orders: -     Hepatic function panel -     Lipid panel -     TSH  History of colonic polyps Assessment & Plan: Colonoscopy 12/12/20 - polyp hepatic flexure, polyp - rectum and internal hemorrhoids. Recommended f/u in 7 years.     Essential hypertension Assessment & Plan: Continue lisinopril  and hydrochlorothiazide   and amlodipine .  Pressure as outlined.  No changes to medication today.  Follow pressures.     Anemia, unspecified type Assessment & Plan: Follow cbc.   Orders: -     CBC with Differential/Platelet  Vitamin D  deficiency Assessment & Plan: Check vitamin D  with next labs.  Orders: -     VITAMIN D  25 Hydroxy (Vit-D Deficiency, Fractures)  Prostate cancer screening -     PSA  Family history of colon cancer Assessment & Plan: Colonoscopy 11/2020.  Recommended follow-up colonoscopy in 7 years.   Lower extremity edema Assessment & Plan: Improved.  Follow.  Compression hose.       Allena Hamilton, MD

## 2024-06-15 NOTE — Assessment & Plan Note (Signed)
 Physical today 06/15/24.  Colonoscopy 11/2020 as outlined.  PSA 07/05/22 - .5. Overdue. Check with next labs.

## 2024-06-21 ENCOUNTER — Encounter: Payer: Self-pay | Admitting: Podiatry

## 2024-06-21 ENCOUNTER — Ambulatory Visit: Admitting: Podiatry

## 2024-06-21 DIAGNOSIS — M79674 Pain in right toe(s): Secondary | ICD-10-CM

## 2024-06-21 DIAGNOSIS — E1151 Type 2 diabetes mellitus with diabetic peripheral angiopathy without gangrene: Secondary | ICD-10-CM

## 2024-06-21 DIAGNOSIS — M79675 Pain in left toe(s): Secondary | ICD-10-CM | POA: Diagnosis not present

## 2024-06-21 DIAGNOSIS — B351 Tinea unguium: Secondary | ICD-10-CM

## 2024-06-21 NOTE — Progress Notes (Signed)
  Subjective:  [name], male    DOB: 07/05/1961,  MRN: [id number]  [name] presents to clinic today for at risk foot care. Pt has h/o NIDDM with PAD and painful thick toenails that are difficult to trim. Pain interferes with ambulation. Aggravating factors include wearing enclosed shoe gear. Pain is relieved with periodic professional debridement.  Chief Complaint  Patient presents with   RFC    Rm2 Diabetic foot care/ Dr. Glendia last visit July 2025/ A1c 6.9   New problem(s): None.   PCP is Glendia Shad, MD.  No Known Allergies  Review of Systems: Negative except as noted in the HPI.  Objective: No changes noted in today's physical examination. There were no vitals filed for this visit. [name] is a pleasant 63 y.o. male morbidly obese in NAD. AAO x 3.  Vascular Examination: CFT <3 seconds b/l. DP/PT pulses faintly palpable b/l. Skin temperature gradient warm to warm b/l. Digital hair absent. Evidence of chronic pedal edema +2 LLE, +1 RLE. Wears compression hose daily. [name] No pain with calf compression. No ischemia or gangrene. No cyanosis or clubbing noted b/l.    Neurological Examination: Sensation grossly intact b/l with 10 gram monofilament. Vibratory sensation intact b/l.   Dermatological Examination: Pedal skin warm and supple b/l.   No open wounds. No interdigital macerations.  Toenails 1-5 b/l thick, discolored, elongated with subungual debris and pain on dorsal palpation.    No corns, calluses, nor porokeratotic lesions.  Musculoskeletal Examination: Muscle strength 5/5 to all lower extremity muscle groups bilaterally. Pes planus deformity noted bilateral LE. Patient ambulates independent of any assistive aids.  Radiographs: None  Last A1c:      Latest Ref Rng & Units 04/17/2024    7:30 AM  Hemoglobin A1C  Hemoglobin-A1c 4.8 - 5.6 % 6.9    Assessment/Plan: 1. Pain due to onychomycosis of toenails of both feet   2. Type  II diabetes mellitus with peripheral circulatory disorder [facility])     Consent given for treatment. Patient examined. All patient's and/or POA's questions/concerns addressed on today's visit. Toenails 1-5 debrided in length and girth without incident. Continue foot and shoe inspections daily. Monitor blood glucose per PCP/Endocrinologist's recommendations. Continue soft, supportive shoe gear daily. Report any pedal injuries to medical professional. Call office if there are any questions/concerns. -Patient/POA to call should there be question/concern in the interim.   Return in about 3 months (around 09/21/2024).  [name], DPM      [place] LOCATION: [address], [place] [address]                   Office [phone]   [facility] LOCATION: [address] [place], [place] [address] Office [phone]

## 2024-08-09 ENCOUNTER — Other Ambulatory Visit: Payer: Self-pay | Admitting: Internal Medicine

## 2024-08-22 ENCOUNTER — Other Ambulatory Visit: Payer: Self-pay | Admitting: Internal Medicine

## 2024-08-23 ENCOUNTER — Ambulatory Visit: Payer: Self-pay | Admitting: Internal Medicine

## 2024-08-23 LAB — HEMOGLOBIN A1C
Est. average glucose Bld gHb Est-mCnc: 174 mg/dL
Hgb A1c MFr Bld: 7.7 % — ABNORMAL HIGH (ref 4.8–5.6)

## 2024-08-23 LAB — CBC WITH DIFFERENTIAL/PLATELET
Basophils Absolute: 0.1 x10E3/uL (ref 0.0–0.2)
Basos: 1 %
EOS (ABSOLUTE): 0.1 x10E3/uL (ref 0.0–0.4)
Eos: 2 %
Hematocrit: 42.3 % (ref 37.5–51.0)
Hemoglobin: 13.7 g/dL (ref 13.0–17.7)
Immature Grans (Abs): 0 x10E3/uL (ref 0.0–0.1)
Immature Granulocytes: 0 %
Lymphocytes Absolute: 1.5 x10E3/uL (ref 0.7–3.1)
Lymphs: 20 %
MCH: 28.6 pg (ref 26.6–33.0)
MCHC: 32.4 g/dL (ref 31.5–35.7)
MCV: 88 fL (ref 79–97)
Monocytes Absolute: 0.7 x10E3/uL (ref 0.1–0.9)
Monocytes: 9 %
Neutrophils Absolute: 5.1 x10E3/uL (ref 1.4–7.0)
Neutrophils: 68 %
Platelets: 332 x10E3/uL (ref 150–450)
RBC: 4.79 x10E6/uL (ref 4.14–5.80)
RDW: 12.8 % (ref 11.6–15.4)
WBC: 7.4 x10E3/uL (ref 3.4–10.8)

## 2024-08-23 LAB — BASIC METABOLIC PANEL WITH GFR
BUN/Creatinine Ratio: 18 (ref 10–24)
BUN: 14 mg/dL (ref 8–27)
CO2: 26 mmol/L (ref 20–29)
Calcium: 9.3 mg/dL (ref 8.6–10.2)
Chloride: 97 mmol/L (ref 96–106)
Creatinine, Ser: 0.76 mg/dL (ref 0.76–1.27)
Glucose: 184 mg/dL — ABNORMAL HIGH (ref 70–99)
Potassium: 3.4 mmol/L — ABNORMAL LOW (ref 3.5–5.2)
Sodium: 139 mmol/L (ref 134–144)
eGFR: 101 mL/min/1.73 (ref >59)

## 2024-08-23 LAB — HEPATIC FUNCTION PANEL
ALT: 17 IU/L (ref 0–44)
AST: 12 IU/L (ref 0–40)
Albumin: 4 g/dL (ref 3.9–4.9)
Alkaline Phosphatase: 108 IU/L (ref 47–123)
Bilirubin Total: 0.4 mg/dL (ref 0.0–1.2)
Bilirubin, Direct: 0.16 mg/dL (ref 0.00–0.40)
Total Protein: 7 g/dL (ref 6.0–8.5)

## 2024-08-23 LAB — LIPID PANEL
Chol/HDL Ratio: 3.5 ratio (ref 0.0–5.0)
Cholesterol, Total: 133 mg/dL (ref 100–199)
HDL: 38 mg/dL — ABNORMAL LOW (ref >39)
LDL Chol Calc (NIH): 77 mg/dL (ref 0–99)
Triglycerides: 98 mg/dL (ref 0–149)
VLDL Cholesterol Cal: 18 mg/dL (ref 5–40)

## 2024-08-23 LAB — MICROALBUMIN / CREATININE URINE RATIO
Creatinine, Urine: 36.2 mg/dL
Microalb/Creat Ratio: 17 mg/g{creat} (ref 0–29)
Microalbumin, Urine: 6.2 ug/mL

## 2024-08-23 LAB — TSH: TSH: 1.01 u[IU]/mL (ref 0.450–4.500)

## 2024-08-23 LAB — VITAMIN D 25 HYDROXY (VIT D DEFICIENCY, FRACTURES): Vit D, 25-Hydroxy: 12 ng/mL — ABNORMAL LOW (ref 30.0–100.0)

## 2024-08-23 LAB — PSA: Prostate Specific Ag, Serum: 0.5 ng/mL (ref 0.0–4.0)

## 2024-08-24 ENCOUNTER — Ambulatory Visit: Admitting: Internal Medicine

## 2024-08-24 VITALS — BP 128/72 | HR 85 | Resp 16 | Ht 73.0 in | Wt 303.0 lb

## 2024-08-24 DIAGNOSIS — E876 Hypokalemia: Secondary | ICD-10-CM | POA: Diagnosis not present

## 2024-08-24 DIAGNOSIS — E1165 Type 2 diabetes mellitus with hyperglycemia: Secondary | ICD-10-CM

## 2024-08-24 DIAGNOSIS — I10 Essential (primary) hypertension: Secondary | ICD-10-CM

## 2024-08-24 DIAGNOSIS — E559 Vitamin D deficiency, unspecified: Secondary | ICD-10-CM | POA: Diagnosis not present

## 2024-08-24 DIAGNOSIS — Z860100 Personal history of colon polyps, unspecified: Secondary | ICD-10-CM

## 2024-08-24 DIAGNOSIS — G4730 Sleep apnea, unspecified: Secondary | ICD-10-CM

## 2024-08-24 DIAGNOSIS — E7800 Pure hypercholesterolemia, unspecified: Secondary | ICD-10-CM

## 2024-08-24 DIAGNOSIS — Z23 Encounter for immunization: Secondary | ICD-10-CM

## 2024-08-24 DIAGNOSIS — I890 Lymphedema, not elsewhere classified: Secondary | ICD-10-CM

## 2024-08-24 LAB — HM DIABETES FOOT EXAM

## 2024-08-24 MED ORDER — SEMAGLUTIDE (2 MG/DOSE) 8 MG/3ML ~~LOC~~ SOPN: 2.0000 mg | PEN_INJECTOR | SUBCUTANEOUS | 3 refills | Status: AC

## 2024-08-24 MED ORDER — VITAMIN D (ERGOCALCIFEROL) 1.25 MG (50000 UNIT) PO CAPS: 50000.0000 [IU] | ORAL_CAPSULE | ORAL | 1 refills | Status: AC

## 2024-08-24 NOTE — Progress Notes (Signed)
 Subjective:    [name], male    DOB: 10-11-1961, 63 y.o.   MRN: [id number]  Patient here for  Chief Complaint  Patient presents with   Medical Management of Chronic Issues    HPI Here for a scheduled follow up - follow up regarding diabetes, hypertension and hypercholesterolemia. Continues on ozempic  and metformin . Tolerating ozempic . Reports increased stress. Not exercising. Not watching diet as well. Discussed diet and exercise. Plans to start exercising. Discussed labs. A1c increased. Reviewed CGM - time active 86%. 45% in range, 52% high and 3% very high. Using cpap regularly. Breathing stable. No chest pain. No abdominal pain or bowel issues reported.    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)    Pt states controlled by diet for last 4 years   Depression    Environmental allergies    Family history of colon cancer    GERD (gastroesophageal reflux disease)    History of chicken pox    Hyperlipidemia    Hypertension    Venous stasis    Vitamin D  deficiency    Past Surgical History:  Procedure Laterality Date   CHOLECYSTECTOMY  2002   COLONOSCOPY WITH PROPOFOL  N/A 06/27/2015   Procedure: COLONOSCOPY WITH PROPOFOL ;  Surgeon: Gladis RAYMOND Mariner, MD;  Location: [facility] ENDOSCOPY;  Service: Endoscopy;  Laterality: N/A;   COLONOSCOPY WITH PROPOFOL  N/A 12/12/2020   Procedure: COLONOSCOPY WITH PROPOFOL ;  Surgeon: Maryruth Ole ONEIDA, MD;  Location: [facility] ENDOSCOPY;  Service: Endoscopy;  Laterality: N/A;   HERNIA REPAIR  1962   Family History  Problem Relation Age of Onset   Stomach cancer Mother    Arthritis Mother    Diabetes Mother    Hypertension Mother    Hyperlipidemia Father    Heart disease Father    Diabetes Father    Hypertension Father    Arthritis Maternal Grandmother    Hyperlipidemia Maternal Grandmother    Hypertension Maternal Grandmother    Arthritis Maternal Grandfather     Hyperlipidemia Maternal Grandfather    Heart disease Maternal Grandfather    Hypertension Maternal Grandfather    Arthritis Paternal Grandmother    Breast cancer Paternal Grandmother    Hypertension Paternal Grandmother    Arthritis Paternal Grandfather    Hyperlipidemia Paternal Grandfather    Heart disease Paternal Grandfather    Diabetes Paternal Grandfather    Hypertension Paternal Grandfather    Diabetes Sister    Social History   Socioeconomic History   Marital status: Widowed    Spouse name: Not on file   Number of children: Not on file   Years of education: Not on file   Highest education level: Bachelor's degree (e.g., BA, AB, BS)  Occupational History   Not on file  Tobacco Use   Smoking status: Never   Smokeless tobacco: Never  Vaping Use   Vaping status: Never Used  Substance and Sexual Activity   Alcohol use: Yes    Alcohol/week: 0.0 - 1.0 standard drinks of alcohol    Comment: 1 drink every 2 months   Drug use: No   Sexual activity: Not on file  Other Topics Concern   Not on file  Social History Narrative   Not on file   Social Drivers of Health   Financial Resource Strain: Low Risk  (04/18/2024)   Overall Financial Resource Strain (CARDIA)    Difficulty of Paying Living Expenses: Not hard at all  Food Insecurity: No Food Insecurity (04/18/2024)   Hunger Vital Sign    Worried About Running Out of Food in the Last Year: Never true    Ran Out of Food in the Last Year: Never true  Transportation Needs: No Transportation Needs (04/18/2024)   PRAPARE - Administrator, Civil Service (Medical): No    Lack of Transportation (Non-Medical): No  Physical Activity: Unknown (04/18/2024)   Exercise Vital Sign    Days of Exercise per Week: 0 days    Minutes of Exercise per Session: Not on file  Stress: No Stress Concern Present (04/18/2024)   [employer] of Occupational Health - Occupational Stress Questionnaire    Feeling of Stress : Only a  little  Social Connections: Socially Integrated (04/18/2024)   Social Connection and Isolation Panel    Frequency of Communication with Friends and Family: Twice a week    Frequency of Social Gatherings with Friends and Family: Once a week    Attends Religious Services: 1 to 4 times per year    Active Member of [employer] or Organizations: Yes    Attends Engineer, structural: More than 4 times per year    Marital Status: Living with partner     Review of Systems  Constitutional:  Negative for appetite change, fever and unexpected weight change.  HENT:  Negative for congestion and sinus pressure.   Respiratory:  Negative for cough and chest tightness.        Breathing stable. No increased sob.   Cardiovascular:  Negative for chest pain and palpitations.       Wearing compression hose regularly. Swelling stable.   Gastrointestinal:  Negative for abdominal pain, diarrhea, nausea and vomiting.  Genitourinary:  Negative for difficulty urinating and dysuria.  Musculoskeletal:  Negative for joint swelling and myalgias.  Skin:  Negative for color change and rash.  Neurological:  Negative for dizziness and headaches.  Psychiatric/Behavioral:  Negative for agitation and dysphoric mood.        Objective:     BP 128/72   Pulse 85   Resp 16   Ht 6' 1 (1.854 m)   Wt (!) 303 lb (137.4 kg)   SpO2 98%   BMI 39.98 kg/m  Wt Readings from Last 3 Encounters:  08/24/24 (!) 303 lb (137.4 kg)  06/15/24 296 lb 6.4 oz (134.4 kg)  04/19/24 282 lb 9.6 oz (128.2 kg)    Physical Exam Vitals reviewed.  Constitutional:      General: He is not in acute distress.    Appearance: Normal appearance. He is well-developed.  HENT:     Head: Normocephalic and atraumatic.     Right Ear: External ear normal.     Left Ear: External ear normal.     Mouth/Throat:     Pharynx: No oropharyngeal exudate or posterior oropharyngeal erythema.  Eyes:     General: No scleral icterus.       Right eye: No  discharge.        Left eye: No discharge.     Conjunctiva/sclera: Conjunctivae normal.  Cardiovascular:     Rate and Rhythm: Normal rate and regular rhythm.  Pulmonary:     Effort: Pulmonary effort is normal. No respiratory distress.     Breath sounds: Normal breath sounds.  Abdominal:     General: Bowel sounds are normal.     Palpations: Abdomen is soft.     Tenderness: There is no abdominal tenderness.  Musculoskeletal:  General: No tenderness.     Cervical back: Neck supple. No tenderness.     Comments: Stable pedal and lower extremity swelling. Stasis changes present.   Lymphadenopathy:     Cervical: No cervical adenopathy.  Skin:    Findings: No erythema or rash.  Neurological:     Mental Status: He is alert.  Psychiatric:        Mood and Affect: Mood normal.        Behavior: Behavior normal.         Outpatient Encounter Medications as of 08/24/2024  Medication Sig   Semaglutide , 2 MG/DOSE, 8 MG/3ML SOPN Inject 2 mg as directed once a week.   Vitamin D , Ergocalciferol , (DRISDOL ) 1.25 MG (50000 UNIT) CAPS capsule Take 1 capsule (50,000 Units total) by mouth every 7 (seven) days.   amLODipine  (NORVASC ) 5 MG tablet TAKE 1 TABLET(5 MG) BY MOUTH DAILY   cholecalciferol (VITAMIN D ) 1000 units tablet Take 1,000 Units by mouth daily.   Continuous Glucose Sensor (DEXCOM G7 SENSOR) MISC APPLY 1 SENSOR TO SKIN AS DIRECTED, CHANGE EVERY 10 DAYS   hydrocortisone  1 % ointment Apply 1 Application topically 2 (two) times daily.   lisinopril  (ZESTRIL ) 40 MG tablet TAKE 1 TABLET BY MOUTH DAILY   loratadine (CLARITIN) 10 MG tablet Take 10 mg by mouth daily as needed for allergies.    metFORMIN  (GLUCOPHAGE ) 500 MG tablet TAKE 1 TABLET BY MOUTH IN THE AM WITH BREAKFAST AND 2 TABLETS IN THE EVENING WITH A MEAL   rosuvastatin  (CRESTOR ) 10 MG tablet TAKE 1 TABLET BY MOUTH DAILY   triamterene -hydrochlorothiazide  (MAXZIDE-25) 37.5-25 MG tablet Take 1 tablet by mouth daily.    [DISCONTINUED] metFORMIN  (GLUCOPHAGE ) 500 MG tablet TAKE 1 TABLET BY MOUTH IN THE  MORNING WITH BREAKFAST AND 2  TABLETS WITH EVENING MEAL   [DISCONTINUED] OZEMPIC , 1 MG/DOSE, 4 MG/3ML SOPN INJECT 1 MG UNDER THE SKIN ONCE A WEEK   No facility-administered encounter medications on file as of 08/24/2024.     Lab Results  Component Value Date   WBC 7.4 08/22/2024   HGB 13.7 08/22/2024   HCT 42.3 08/22/2024   PLT 332 08/22/2024   GLUCOSE 184 (H) 08/22/2024   CHOL 133 08/22/2024   TRIG 98 08/22/2024   HDL 38 (L) 08/22/2024   LDLCALC 77 08/22/2024   ALT 17 08/22/2024   AST 12 08/22/2024   NA 139 08/22/2024   K 3.4 (L) 08/22/2024   CL 97 08/22/2024   CREATININE 0.76 08/22/2024   BUN 14 08/22/2024   CO2 26 08/22/2024   TSH 1.010 08/22/2024   HGBA1C 7.7 (H) 08/22/2024    DG Abd 1 View Result Date: 06/09/2023 CLINICAL DATA:  Abdominal pain EXAM: ABDOMEN - 1 VIEW COMPARISON:  None Available. FINDINGS: Bowel gas pattern is nonspecific. Moderate to large amount of stool is seen in colon, mostly in the ascending colon. There is no small bowel dilation. Surgical clips are seen in gallbladder fossa. Degenerative changes are noted in thoracic and lumbar spine. No abnormal masses or calcifications are seen. Kidneys are partly obscured by bowel contents. IMPRESSION: Nonspecific bowel gas pattern. Moderate to large stool burden in colon, more so in ascending colon. Electronically Signed   By: Gearldine Mary M.D.   On: 06/09/2023 14:41   DG Lumbar Spine 2-3 Views Result Date: 06/09/2023 CLINICAL DATA:  Back pain, no recent trauma. EXAM: LUMBAR SPINE - 2-3 VIEW COMPARISON:  None Available. FINDINGS: There is no evidence of lumbar spine fracture. Mild  anterior vertebral body height loss suggesting mild compression deformity of L1. Straightening of the lumbar spine. Mild multilevel degenerate disc disease. IMPRESSION: Mild anterior vertebral body height loss suggesting mild compression deformity of L1,  age indeterminate. 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 Electronically Signed   By: Imran  Ahmed D.O.   On: 06/09/2023 14:38   DG Thoracic Spine 2 View Result Date: 06/09/2023 CLINICAL DATA:  Increased back pain.  No injury. EXAM: THORACIC SPINE 2 VIEWS COMPARISON:  None Available. FINDINGS: There is no evidence of thoracic spine fracture. Alignment is normal. Mild multilevel degenerate disc disease with disc height loss and marginal spurring. IMPRESSION: Mild multilevel degenerative disc disease. Electronically Signed   By: Imran  Ahmed D.O.   On: 06/09/2023 14:35       Assessment & Plan:  Immunization due -     Flu vaccine trivalent PF, 6mos and older(Flulaval,Afluria,Fluarix,Fluzone)  Type 2 diabetes mellitus with hyperglycemia, without long-term current use of insulin (HCC) Assessment & Plan: Currently on Ozempic  and metformin .  Blood sugars as outlined.  Have discussed diet and exercise.  Tolerating ozempic . Will increase to 2mg  weekly. Low carb diet. Follow met b and A1c. Referral to pharmacy team for help with management/follow up.   Orders: -     Basic metabolic panel with GFR -     Hemoglobin A1c  Hypercholesterolemia Assessment & Plan: Continue crestor . Discussed diet and exercise. Follow lipid panel.  Lab Results  Component Value Date   CHOL 133 08/22/2024   HDL 38 (L) 08/22/2024   LDLCALC 77 08/22/2024   TRIG 98 08/22/2024   CHOLHDL 3.5 08/22/2024     Orders: -     Hepatic function panel -     Lipid panel  Hypokalemia Assessment & Plan: Potassium slightly decreased on recent labs. Information on foods with increased potassium. Recheck potassium soon to confirm wnl.   Orders: -     Potassium  Vitamin D  deficiency Assessment & Plan: Vitamin D  low - start ergocalciferol  50,000 units q week. Follow vitamin D  level.    Sleep apnea, unspecified type Assessment & Plan: Continues using cpap nightly  and benefiting from use. Had f/u with pulmonary 03/2024. Benefiting from use.    Lymphedema Assessment & Plan: Continue compression hose. Leg elevation. Stable.    History of colonic polyps Assessment & Plan: Colonoscopy 12/12/20 - polyp hepatic flexure, polyp - rectum and internal hemorrhoids. Recommended f/u in 7 years.     Essential hypertension Assessment & Plan: Continue lisinopril  and hydrochlorothiazide  and amlodipine .  Pressure as outlined.  No changes to medication today.  Follow pressures. Follow metabolic panel.    Other orders -     Semaglutide  (2 MG/DOSE); Inject 2 mg as directed once a week.  Dispense: 3 mL; Refill: 3 -     Vitamin D  (Ergocalciferol ); Take 1 capsule (50,000 Units total) by mouth every 7 (seven) days.  Dispense: 12 capsule; Refill: 1     Allena Hamilton, MD

## 2024-08-26 ENCOUNTER — Encounter: Payer: Self-pay | Admitting: Internal Medicine

## 2024-08-26 NOTE — Assessment & Plan Note (Signed)
 Potassium slightly decreased on recent labs. Information on foods with increased potassium. Recheck potassium soon to confirm wnl.

## 2024-08-26 NOTE — Assessment & Plan Note (Signed)
Colonoscopy 12/12/20 - polyp hepatic flexure, polyp - rectum and internal hemorrhoids. Recommended f/u in 7 years.   

## 2024-08-26 NOTE — Assessment & Plan Note (Signed)
 Vitamin D  low - start ergocalciferol  50,000 units q week. Follow vitamin D  level.

## 2024-08-26 NOTE — Assessment & Plan Note (Signed)
 Continue compression hose. Leg elevation. Stable.

## 2024-08-26 NOTE — Assessment & Plan Note (Signed)
 Continues using cpap nightly and benefiting from use. Had f/u with pulmonary 03/2024. Benefiting from use.

## 2024-08-26 NOTE — Assessment & Plan Note (Signed)
 Continue lisinopril  and hydrochlorothiazide  and amlodipine .  Pressure as outlined.  No changes to medication today.  Follow pressures. Follow metabolic panel.

## 2024-08-26 NOTE — Assessment & Plan Note (Signed)
 Currently on Ozempic  and metformin .  Blood sugars as outlined.  Have discussed diet and exercise.  Tolerating ozempic . Will increase to 2mg  weekly. Low carb diet. Follow met b and A1c. Referral to pharmacy team for help with management/follow up.

## 2024-08-26 NOTE — Assessment & Plan Note (Signed)
 Continue crestor . Discussed diet and exercise. Follow lipid panel.  Lab Results  Component Value Date   CHOL 133 08/22/2024   HDL 38 (L) 08/22/2024   LDLCALC 77 08/22/2024   TRIG 98 08/22/2024   CHOLHDL 3.5 08/22/2024

## 2024-08-29 ENCOUNTER — Telehealth: Payer: Self-pay

## 2024-08-29 ENCOUNTER — Other Ambulatory Visit: Payer: Self-pay

## 2024-08-29 DIAGNOSIS — E1165 Type 2 diabetes mellitus with hyperglycemia: Secondary | ICD-10-CM

## 2024-08-29 NOTE — Progress Notes (Signed)
 Care Guide Pharmacy Note  08/29/2024 Name: [name] MRN: [id number] DOB: [birth year]  Referred By: Glendia Shad, MD Reason for referral: Complex Care Management and Call Attempt #1 (Successful initial outreach scheduled with PHARM D- Manuelita )   [name] is a 63 y.o. year old male who is a primary care patient of Glendia Shad, MD.  [name] was referred to the pharmacist for assistance related to: DMII  Successful contact was made with the patient to discuss pharmacy services including being ready for the pharmacist to call at least 5 minutes before the scheduled appointment time and to have medication bottles and any blood pressure readings ready for review. The patient agreed to meet with the pharmacist via telephone visit on (date/time). 09/11/24 @ 1 PM  [name] [facility], [facility] Guide  Direct Dial: [phone]  Fax [phone]

## 2024-09-11 ENCOUNTER — Other Ambulatory Visit: Admitting: Pharmacist

## 2024-09-11 DIAGNOSIS — E1165 Type 2 diabetes mellitus with hyperglycemia: Secondary | ICD-10-CM

## 2024-09-11 NOTE — Patient Instructions (Addendum)
 Mr. [name],   It was a pleasure to speak with you today! As we discussed:?   Keep up all the great work with diet/walking. Your numbers are certainly starting to show some improvement on the Dexcom report.  Continue metformin  500 mg, 1 tablet morning, 2 tablets evening We will send a refill for the same medicine, but extended release (metformin  XR). This allows you to take your full daily dose all at one time without getting too much medication all at once.  With regular metformin , the maximum dose is usually 1000 mg at a time, up to two times daily.  Continue Ozempic  1.0 mg weekly until you run out, then increase to 2.0 mg as discussed with Dr. Glendia. I am hopeful this Ozempic  increase will be enough to keep your A1c below 7.    Please reach out prior to your next scheduled appointment should you have any questions or concerns with Ozempic  or with your sugar numbers.  In the new year, if you have any concerns regarding insurance/medication cost changes at the pharmacy, please don't hesitate to reach out.   You may reach me via MyChart Message, or you may leave me a voicemail at [phone] and I will get back to you shortly.   Thank you!   Future Appointments  Date Time Provider Department Center  09/24/2024  1:15 PM Gaynel Delon [place], [place] [facility]  11/27/2024  8:00 AM Glendia Shad, MD [facility] [id number] FABIENE Geronimo, PharmD Clinical Pharmacist [facility] [phone]

## 2024-09-11 NOTE — Progress Notes (Unsigned)
 09/11/2024 Name: [name] MRN: [id number] DOB: 1961-11-05  Subjective  Chief Complaint  Patient presents with   Diabetes   Reason for visit: ?  [name] is a 63 y.o. male with a history of diabetes (type 2), who presents today for an initial diabetes pharmacotherapy visit. They were referred by their PCP for management of Diabetes.  Pertinent PMH also includes HTN, sleep apnea on CPAP, BIM>30, HLD, lymphedema/LE swelling.  Known DM Complications: none   Care Team: Primary Care Provider: Glendia Shad, MD   Date of Last Diabetes Related Visit: with PCP on 08/24/24  Recent Summary of Change: 10/3: Increase Ozempic  to 2 mg/wk  Medication Access/Adherence: Prescription drug coverage: Payor: CIGNA / Plan: CIGNA MANAGED / Product Type: *No Product type* / .  - Reports that all medications are affordable. Ozempic  ~$30/mo, $70/68mo - Medication Adherence: Patient denies missing doses of their medication.  Since Last visit / History of Present Illness: ?  Patient reports doing well overall since last PCP visit. Feels a lot better generally speaking. More energy, feeling good. Notes sugars are improving, CPAP device has been working well for him. Is sleeping better. Is on vacation so work stress has been better.   Has 6 weeks left of Ozempic  1.0 mg dose so has not increased to 2.0 mg yet at this time. Notes he uses [employer] account for medication copays so is stretching that through the end of the year.   Denies adverse effects with metformin . Notes taking all 3 tablets (1500 mg) once daily as this is easier to remember. Does take with a meal.   Reported DM Regimen: ?  Metformin  500 mg am / 1000 mg pm  Ozempic  1 mg weekly    DM medications tried in the past:?  N/A  SMBG: Dexcom G7 ?     Hypo/Hyperglycemia: ?  Symptoms of hypoglycemia since last visit:? no  Symptoms of hyperglycemia since last visit:? no  Reported Diet: Patient notes continual improvement with diet.  Has reduced/cut out most fried food. Opts for boiled seafood for example.   Exercise: Walking more recently as he is on family vacation at the [employer]  DM Prevention:  Statin: Taking; moderate intensity.?  History of chronic kidney disease? no History of albuminuria? yes, last UACR on 08/22/24 = 17 mg/g ACE/ARB - Taking lisinopril  40 mg/d; Urine MA/CR Ratio - <30 mg/g.  Last eye exam: 09/28/23; No retinopathy present Last foot exam: 08/24/2024 Tobacco Use: Never smoker  Immunizations:? Flu: Up to date (Last: 08/24/2024); Pneumococcal: Due. PPSV23 (10/2018); Shingrix: No Record - DUE  Cardiovascular Risk Reduction History of clinical ASCVD? no The 10-year ASCVD risk score (Arnett DK, et al., 2019) is: 20.2% History of heart failure? no History of hyperlipidemia? yes Current BMI: 40 kg/m2 (Ht 6'1, Wt 137.4 kg) Taking statin? yes; moderate intensity (rosuvastatin  10mg ) Taking aspirin? not indicated; Not taking   Taking SGLT-2i? no Taking GLP- 1 RA? yes      _______________________________________________  Objective    Review of Systems:? Limited in the setting of virtual visit  Constitutional:? No fever, chills or unintentional weight loss  GI:? No nausea, vomiting, constipation, diarrhea, abdominal pain, dyspepsia, change in bowel habits  Endocrine:? No polyuria, polyphagia or blurred vision   Physical Examination:  Vitals:  Wt Readings from Last 3 Encounters:  08/24/24 (!) 303 lb (137.4 kg)  06/15/24 296 lb 6.4 oz (134.4 kg)  04/19/24 282 lb 9.6 oz (128.2 kg)   BP Readings from Last  3 Encounters:  08/24/24 128/72  06/15/24 118/72  04/19/24 128/80   Pulse Readings from Last 3 Encounters:  08/24/24 85  06/15/24 82  04/19/24 78   Labs:?  Lab Results  Component Value Date   HGBA1C 7.7 (H) 08/22/2024   HGBA1C 6.9 (H) 04/17/2024   HGBA1C 7.1 (H) 06/08/2023   GLUCOSE 184 (H) 08/22/2024   MICRALBCREAT 17 08/22/2024   MICRALBCREAT 9 04/17/2024   MICRALBCREAT 12  07/05/2022   CREATININE 0.76 08/22/2024   CREATININE 0.79 04/17/2024   CREATININE 0.87 06/08/2023    Lab Results  Component Value Date   CHOL 133 08/22/2024   LDLCALC 77 08/22/2024   LDLCALC 59 04/17/2024   LDLCALC 70 06/08/2023   HDL 38 (L) 08/22/2024   TRIG 98 08/22/2024   TRIG 120 04/17/2024   TRIG 112 06/08/2023   ALT 17 08/22/2024   ALT 14 04/17/2024   AST 12 08/22/2024   AST 16 04/17/2024      Chemistry      Component Value Date/Time   NA 139 08/22/2024 1055   K 3.4 (L) 08/22/2024 1055   CL 97 08/22/2024 1055   CO2 26 08/22/2024 1055   BUN 14 08/22/2024 1055   CREATININE 0.76 08/22/2024 1055   GLU 173 06/17/2022 0000      Component Value Date/Time   CALCIUM  9.3 08/22/2024 1055   ALKPHOS 108 08/22/2024 1055   AST 12 08/22/2024 1055   ALT 17 08/22/2024 1055   BILITOT 0.4 08/22/2024 1055     The 10-year ASCVD risk score (Arnett DK, et al., 2019) is: 20.2%  Assessment and Plan:   1. Diabetes, type 2: uncontrolled per last A1c of 7.7% (08/22/24), increased from previous, 6.9%. Goal <7% without hypoglycemia. Improvement on CGM today which patient attributes to better sleep/stress, more mindful of diet (less friend, mindful of potatoes/carb intake).  No adverse effects on metformin  IR though is taking 1500 mg once daily for convenience. Is open to XR formulation on refill as to ensure not harming kidneys with dosing once daily.  Patient preference to finish out Ozempic  1.0 mg (6 weeks), then increase to 2.0 mg for cost. Reasonable though encouraged him to keep up the good work with diet/activity to maintain glycemic improvements in the meantime.   CGM data shows improvement. ABG 168 mg/dL, GMI 2.6% Current Regimen: Ozempic  1.0 mg/wk, metformin  IR 1500 mg/d Diet: mindful of consuming less carbs (potatoes, pastas) Metformin  IR>XR since taking 3 pills all at once.  Discussed option to double up on the 1.0 mg Ozempic  though patient preference to finish out Ozempic  1.0 mg  (6 weeks), then increase to 2.0 mg in Nov.  Future Consideration: GLP1-RA: Titration to max dose next month. If insurance allows, may consider Mounjaro in the future if needed for greater A1c-lowering propensity. DPP4i: N/A SGLT2i: No hx. Reasonable if cost not an issue, added renal/CV risk benefit. Expect up to ~1 point A1c reduction. Metformin : Room for titration as needed.     2. HTN: controlled based on last clinic BP of 128/72 mmHg (08/24/24), goal <130/80 mmHg. Current Regimen: amlodipine  5 mg/d, lisinopril  40 mg/d, triamterene /hydrochlorothiazide  37.5/25 mg/d Continue medications without changes   3. ASCVD (primary prevention): uncontrolled on last lipid panel with LDL 77 mg/dL, TG 98 mg/dL (89/8/74). LDL goal <70 mg/dL (primary prevention, diabetes).  Key risk factors: diabetes, hypertension (well-controlled), hyperlipidemia, BMI >30 kg/m2, and sedentary lifestyle The 10-year ASCVD risk score (Arnett DK, et al., 2019) is: 20.2% = High risk  Current Regimen: rosuvastatin  10 mg daily Continue medications today without changes Future Consideration: With ASCVD risk score >20%, would benefit from high=intensity statin. Reasonable to continue with lifestyle interventions. If LDL at next PCP f/u in 3 months remains, elevated, statin increase recommended if patient willing.   4. Healthcare Maintenance:  Pneumococcal - Current status: No record PCV. DUE for PCV20  Shingles - Current status: No record DUE Influenza - Current status: Up to date for 2025-2026.   Due to receive the following vaccines: Shingrix, PCV20, and Covid Booster   Follow Up Follow up with clinical pharmacist via phone in 8 weeks to assess Ozempic  titration (due for increase in 6 weeks)  Patient given direct line for questions regarding medication therapy Best time for calls: Lunch hour ~1-2pm (work from home)   Future Appointments  Date Time Provider Department Center  09/24/2024  1:15 PM Gaynel Delon CROME, DPM  [facility]  11/27/2024  8:00 AM Glendia Shad, MD [facility] [address] FABIENE Geronimo, PharmD Clinical Pharmacist [facility] [phone]

## 2024-09-12 MED ORDER — METFORMIN HCL ER 500 MG PO TB24: ORAL_TABLET | ORAL | 3 refills | Status: AC

## 2024-09-24 ENCOUNTER — Ambulatory Visit: Admitting: Podiatry

## 2024-09-24 DIAGNOSIS — Z91198 Patient's noncompliance with other medical treatment and regimen for other reason: Secondary | ICD-10-CM

## 2024-09-24 DIAGNOSIS — Z91199 Patient's noncompliance with other medical treatment and regimen due to unspecified reason: Secondary | ICD-10-CM

## 2024-09-24 NOTE — Progress Notes (Signed)
 1. No-show for appointment

## 2024-09-26 ENCOUNTER — Other Ambulatory Visit: Payer: Self-pay | Admitting: Internal Medicine

## 2024-09-26 DIAGNOSIS — E1165 Type 2 diabetes mellitus with hyperglycemia: Secondary | ICD-10-CM

## 2024-10-08 ENCOUNTER — Other Ambulatory Visit: Payer: Self-pay | Admitting: Internal Medicine

## 2024-11-09 ENCOUNTER — Encounter: Payer: Self-pay | Admitting: Podiatry

## 2024-11-09 ENCOUNTER — Ambulatory Visit: Admitting: Podiatry

## 2024-11-09 DIAGNOSIS — M79674 Pain in right toe(s): Secondary | ICD-10-CM | POA: Diagnosis not present

## 2024-11-09 DIAGNOSIS — B351 Tinea unguium: Secondary | ICD-10-CM | POA: Diagnosis not present

## 2024-11-09 DIAGNOSIS — M79675 Pain in left toe(s): Secondary | ICD-10-CM

## 2024-11-09 DIAGNOSIS — E1151 Type 2 diabetes mellitus with diabetic peripheral angiopathy without gangrene: Secondary | ICD-10-CM

## 2024-11-09 NOTE — Progress Notes (Signed)
"  °  Subjective:  [name], male    DOB: 26-Mar-1961,  MRN: [id number]  [name] presents to clinic today for at risk foot care. Pt has h/o NIDDM with PAD and painful mycotic toenails x 10 which interfere with daily activities. Pain is relieved with periodic professional debridement. Patient states he forgot about his last appointment, so his toenails are longer than usual. Chief Complaint  Patient presents with   Diabetes    [facility]. A1c 7.7. He saw Dr. Glendia in June/July   New problem(s): None.   PCP is Glendia Shad, MD.  Allergies[1]  Review of Systems: Negative except as noted in the HPI.  Objective: No changes noted in today's physical examination. There were no vitals filed for this visit. [name] is a pleasant 63 y.o. male morbidly obese in NAD. AAO x 3.  Vascular Examination: CFT <3 seconds b/l. DP/PT pulses faintly palpable b/l. Skin temperature gradient warm to warm b/l. Digital hair absent. Evidence of chronic pedal edema secondary to CVI +2 LLE, +1 RLE. Wears compression hose daily. No pain with calf compression. No ischemia or gangrene. No cyanosis or clubbing noted b/l.  SABRA  Neurological Examination: Sensation grossly intact b/l with 10 gram monofilament. Vibratory sensation intact b/l.   Dermatological Examination: Pedal skin warm and supple b/l.   No open wounds. No interdigital macerations.  Toenails 1-5 b/l thick, discolored, elongated with subungual debris and pain on dorsal palpation.    No corns, calluses, nor porokeratotic lesions.  Musculoskeletal Examination: Muscle strength 5/5 to all lower extremity muscle groups bilaterally. Pes planus deformity noted bilateral LE. Patient ambulates independent of any assistive aids.  Radiographs: None  Assessment/Plan: 1. Pain due to onychomycosis of toenails of both feet   2. Type II diabetes mellitus with peripheral circulatory disorder [facility])   Patient was evaluated and treated.  All patient's and/or POA's questions/concerns addressed on today's visit. Mycotic toenails 1-5 b/l debrided in length and girth without incident.  Continue daily foot inspections and monitor blood glucose per PCP/Endocrinologist's recommendations.Continue soft, supportive shoe gear daily. Report any pedal injuries to medical professional. Call office if there are any quesitons/concerns. -Patient/POA to call should there be question/concern in the interim.   Return in about 3 months (around 02/07/2025).  [name], DPM      [place] LOCATION: [address], [place] [address]                   Office [phone]   [facility] LOCATION: [address] [place], [place] [address] Office [phone]     [1] No Known Allergies  "

## 2024-11-12 LAB — OPHTHALMOLOGY REPORT-SCANNED

## 2024-11-13 DIAGNOSIS — E1165 Type 2 diabetes mellitus with hyperglycemia: Secondary | ICD-10-CM

## 2024-11-13 NOTE — Progress Notes (Signed)
 "  11/13/2024 Name: [name] MRN: [id number] DOB: 1961/09/11  Subjective  Chief Complaint  Patient presents with   Diabetes   Reason for visit: ?  [name] is a 63 y.o. male with a history of diabetes (type 2), who presents today for an initial diabetes pharmacotherapy visit. They were referred by their PCP for management of Diabetes.  Pertinent PMH also includes HTN, sleep apnea on CPAP, BIM>30, HLD, lymphedema/LE swelling.  Known DM Complications: none   Care Team: Primary Care Provider: Glendia Shad, MD   Date of Last Diabetes Related Visit: with PCP on 08/24/24  Recent Summary of Change: 10/3: Increase Ozempic  to 2 mg/wk  Medication Access/Adherence: Prescription drug coverage: Payor: CIGNA / Plan: CIGNA MANAGED / Product Type: *No Product type* / .  - Reports that all medications are affordable. Ozempic  ~$30/mo, $70/54mo - **[facility] changing to [employer] plan in 2026** - Medication Adherence: Patient denies missing doses of their medication.  Since Last visit / History of Present Illness: ?  Patient reports doing well overall since last visit.  Confirms finishing his supply of Ozempic  1 mg dose. First 2 mg injection was last week without concerns for side effects. Next dose due later this week. Has several refills remaining at the pharmacy. Is unsure of cost difference for brand meds with new insurance plan in 2026.   Reported DM Regimen: ?  Metformin  500 mg am / 1000 mg pm  Ozempic  2 mg weekly (increased <7 days ago)   DM medications tried in the past:?  N/A  SMBG: Dexcom G7 ?      Hypo/Hyperglycemia: ?  Symptoms of hypoglycemia since last visit:? no  Symptoms of hyperglycemia since last visit:? no      _______________________________________________  Objective    Review of Systems:? Limited in the setting of virtual visit  Constitutional:? No fever, chills or unintentional weight loss  GI:? No nausea, vomiting, constipation, diarrhea, abdominal  pain, dyspepsia, change in bowel habits  Endocrine:? No polyuria, polyphagia or blurred vision   Physical Examination:  Vitals:  Wt Readings from Last 3 Encounters:  08/24/24 (!) 303 lb (137.4 kg)  06/15/24 296 lb 6.4 oz (134.4 kg)  04/19/24 282 lb 9.6 oz (128.2 kg)   BP Readings from Last 3 Encounters:  08/24/24 128/72  06/15/24 118/72  04/19/24 128/80   Pulse Readings from Last 3 Encounters:  08/24/24 85  06/15/24 82  04/19/24 78   Labs:?  Lab Results  Component Value Date   HGBA1C 7.7 (H) 08/22/2024   HGBA1C 6.9 (H) 04/17/2024   HGBA1C 7.1 (H) 06/08/2023   GLUCOSE 184 (H) 08/22/2024   MICRALBCREAT 17 08/22/2024   MICRALBCREAT 9 04/17/2024   MICRALBCREAT 12 07/05/2022   CREATININE 0.76 08/22/2024   CREATININE 0.79 04/17/2024   CREATININE 0.87 06/08/2023    Lab Results  Component Value Date   CHOL 133 08/22/2024   LDLCALC 77 08/22/2024   LDLCALC 59 04/17/2024   LDLCALC 70 06/08/2023   HDL 38 (L) 08/22/2024   TRIG 98 08/22/2024   TRIG 120 04/17/2024   TRIG 112 06/08/2023   ALT 17 08/22/2024   ALT 14 04/17/2024   AST 12 08/22/2024   AST 16 04/17/2024      Chemistry      Component Value Date/Time   NA 139 08/22/2024 1055   K 3.4 (L) 08/22/2024 1055   CL 97 08/22/2024 1055   CO2 26 08/22/2024 1055   BUN 14 08/22/2024 1055   CREATININE  0.76 08/22/2024 1055   GLU 173 06/17/2022 0000      Component Value Date/Time   CALCIUM  9.3 08/22/2024 1055   ALKPHOS 108 08/22/2024 1055   AST 12 08/22/2024 1055   ALT 17 08/22/2024 1055   BILITOT 0.4 08/22/2024 1055     The 10-year ASCVD risk score (Arnett DK, et al., 2019) is: 20.2%  Assessment and Plan:   1. Diabetes, type 2: uncontrolled per last A1c of 7.7% (08/22/24), increased from previous, 6.9%. Goal <7% without hypoglycemia.  CGM report today with GMI 7.7%, increased from previous (though has just recently increased Ozempic ). Tolerating titration well w/o concerns. Agreed to review of sugars at next PCP  visit (2 weeks). Will need to assess cost of brand medications in Jan given insurance plan is changing. Also have option of considering Mounjaro in place of Ozempic  for further weight and glycemia reduction potential.  CGM data. ABG 184 mg/dL, GMI 2.2%. 50% TIR.  Current Regimen: Ozempic  2.0 mg/wk, metformin  IR 1500 mg/d Diet: mindful of consuming less carbs (potatoes, pastas) Continue Ozempic  at increased 2.0 mg dose. CGM check at upcoming PCP visit (will be on 4th week of 2.0 mg dose) Reminder set to run test claim 1st week of January - new insurance ([employer]>BCBS in 2026) Future Consideration: GLP1-RA: If insurance allows, may consider Mounjaro in the future if needed for greater A1c-lowering propensity. DPP4i: N/A - tolerating GLP1 well SGLT2i: No hx. Reasonable if cost not an issue, added renal/CV risk benefit. Expect up to ~1 point A1c reduction. Metformin : Room for titration as needed.     2. HTN: controlled based on last clinic BP of 128/72 mmHg (08/24/24), goal <130/80 mmHg. Current Regimen: amlodipine  5 mg/d, lisinopril  40 mg/d, triamterene /hydrochlorothiazide  37.5/25 mg/d Continue medications without changes   3. ASCVD (primary prevention): uncontrolled on last lipid panel with LDL 77 mg/dL, TG 98 mg/dL (89/8/74). LDL goal <70 mg/dL (primary prevention, diabetes).  Key risk factors: diabetes, hypertension (well-controlled), hyperlipidemia, BMI >30 kg/m2, and sedentary lifestyle The 10-year ASCVD risk score (Arnett DK, et al., 2019) is: 20.2% = High risk   Current Regimen: rosuvastatin  10 mg daily Continue medications today without changes Future Consideration: With ASCVD risk score >20%, would benefit from high-intensity statin. Reasonable to continue with lifestyle interventions. If LDL at next PCP f/u remains, elevated, statin increase recommended if patient willing.   4. Healthcare Maintenance:  Pneumococcal - Current status: No record PCV. DUE for PCV20  Shingles - Current  status: No record DUE Influenza - Current status: Up to date for 2025-2026.   Due to receive the following vaccines: Shingrix, PCV20, and Covid Booster   Follow Up Scheduling Notes: Best time for calls: Lunch hour ~1-2pm (work from home)  PCP visit as scheduled 2 weeks (reminded patient to bring new insurance card to visit) Reminder set 1/7 to review new insurance benefits surrounding GLP1 (may require PA / new copay card) Patient given direct line for questions regarding medication therapy  Future Appointments  Date Time Provider Department Center  11/27/2024  8:00 AM Glendia Shad, MD [facility] [address]  02/08/2025  7:45 AM Gaynel Delon CROME, DPM [facility]   Manuelita FABIENE Kobs, PharmD Clinical Pharmacist [facility] [phone]  "

## 2024-11-16 ENCOUNTER — Other Ambulatory Visit: Payer: Self-pay | Admitting: Internal Medicine

## 2024-11-16 MED ORDER — AMLODIPINE BESYLATE 5 MG PO TABS: ORAL_TABLET | ORAL | 3 refills | Status: AC

## 2024-11-16 NOTE — Telephone Encounter (Signed)
 Copied from CRM #[id number]. Topic: Clinical - Medication Refill >> Nov 16, 2024  1:38 PM Robinson H wrote: Medication: amLODipine  (NORVASC ) 5 MG tablet  Has the patient contacted their pharmacy? No, no refills in system for patient (Agent: If no, request that the patient contact the pharmacy for the refill. If patient does not wish to contact the pharmacy document the reason why and proceed with request.) (Agent: If yes, when and what did the pharmacy advise?)  This is the patient's preferred pharmacy:  [id number] [place], [place] - [address] AT [facility] OF [address] [place] [address] Phone: [phone] Fax: [phone]    Is this the correct pharmacy for this prescription? Yes If no, delete pharmacy and type the correct one.   Has the prescription been filled recently? No  Is the patient out of the medication? Yes  Has the patient been seen for an appointment in the last year OR does the patient have an upcoming appointment? Yes  Can we respond through MyChart? Yes  Agent: Please be advised that Rx refills may take up to 3 business days. We ask that you follow-up with your pharmacy.

## 2024-11-27 ENCOUNTER — Ambulatory Visit: Admitting: Internal Medicine

## 2024-11-27 ENCOUNTER — Encounter: Payer: Self-pay | Admitting: Internal Medicine

## 2024-11-27 VITALS — BP 126/74 | HR 96 | Temp 97.9°F | Ht 73.0 in | Wt 296.6 lb

## 2024-11-27 DIAGNOSIS — E1165 Type 2 diabetes mellitus with hyperglycemia: Secondary | ICD-10-CM | POA: Diagnosis not present

## 2024-11-27 DIAGNOSIS — Z860100 Personal history of colon polyps, unspecified: Secondary | ICD-10-CM

## 2024-11-27 DIAGNOSIS — E7800 Pure hypercholesterolemia, unspecified: Secondary | ICD-10-CM | POA: Diagnosis not present

## 2024-11-27 DIAGNOSIS — I10 Essential (primary) hypertension: Secondary | ICD-10-CM

## 2024-11-27 DIAGNOSIS — Z7985 Long-term (current) use of injectable non-insulin antidiabetic drugs: Secondary | ICD-10-CM

## 2024-11-27 DIAGNOSIS — D649 Anemia, unspecified: Secondary | ICD-10-CM | POA: Diagnosis not present

## 2024-11-27 DIAGNOSIS — E1351 Other specified diabetes mellitus with diabetic peripheral angiopathy without gangrene: Secondary | ICD-10-CM

## 2024-11-27 DIAGNOSIS — E559 Vitamin D deficiency, unspecified: Secondary | ICD-10-CM

## 2024-11-27 DIAGNOSIS — G4730 Sleep apnea, unspecified: Secondary | ICD-10-CM

## 2024-11-27 DIAGNOSIS — Z7984 Long term (current) use of oral hypoglycemic drugs: Secondary | ICD-10-CM | POA: Diagnosis not present

## 2024-11-27 NOTE — Progress Notes (Signed)
 "  Subjective:    [name], male    DOB: 1961/07/15, 64 y.o.   MRN: [id number]  Patient here for  Chief Complaint  Patient presents with   Medical Management of Chronic Issues    HPI Here for a scheduled follow up -  follow up regarding diabetes, hypertension and hypercholesterolemia. Continues on ozempic  and metformin . Tolerating ozempic . Reviewed CGM - time active 97%. 82% in range, 18% high. Using cpap regularly. Breathing stable. Actually feels breathing is better. Sleeping. Saw podiatry 11/09/24 - diabetic foot exam. Recently started on 2mg  ozempic . Tolerating. Feels bowels are better. Due shingrix. No chest pain. No abdominal pain. Handling stress.    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)    Pt states controlled by diet for last 4 years   Depression    Environmental allergies    Family history of colon cancer    GERD (gastroesophageal reflux disease)    History of chicken pox    Hyperlipidemia    Hypertension    Venous stasis    Vitamin D  deficiency    Past Surgical History:  Procedure Laterality Date   CHOLECYSTECTOMY  2002   COLONOSCOPY WITH PROPOFOL  N/A 06/27/2015   Procedure: COLONOSCOPY WITH PROPOFOL ;  Surgeon: Gladis RAYMOND Mariner, MD;  Location: [facility] ENDOSCOPY;  Service: Endoscopy;  Laterality: N/A;   COLONOSCOPY WITH PROPOFOL  N/A 12/12/2020   Procedure: COLONOSCOPY WITH PROPOFOL ;  Surgeon: Maryruth Ole ONEIDA, MD;  Location: [facility] ENDOSCOPY;  Service: Endoscopy;  Laterality: N/A;   HERNIA REPAIR  1962   Family History  Problem Relation Age of Onset   Stomach cancer Mother    Arthritis Mother    Diabetes Mother    Hypertension Mother    Hyperlipidemia Father    Heart disease Father    Diabetes Father    Hypertension Father    Arthritis Maternal Grandmother    Hyperlipidemia Maternal Grandmother    Hypertension Maternal Grandmother    Arthritis Maternal Grandfather     Hyperlipidemia Maternal Grandfather    Heart disease Maternal Grandfather    Hypertension Maternal Grandfather    Arthritis Paternal Grandmother    Breast cancer Paternal Grandmother    Hypertension Paternal Grandmother    Arthritis Paternal Grandfather    Hyperlipidemia Paternal Grandfather    Heart disease Paternal Grandfather    Diabetes Paternal Grandfather    Hypertension Paternal Grandfather    Diabetes Sister    Social History   Socioeconomic History   Marital status: Widowed    Spouse name: Not on file   Number of children: Not on file   Years of education: Not on file   Highest education level: Bachelor's degree (e.g., BA, AB, BS)  Occupational History   Not on file  Tobacco Use   Smoking status: Never   Smokeless tobacco: Never  Vaping Use   Vaping status: Never Used  Substance and Sexual Activity   Alcohol use: Yes    Alcohol/week: 0.0 - 1.0 standard drinks of alcohol    Comment: 1 drink every 2 months   Drug use: No   Sexual activity: Not on file  Other Topics Concern   Not on file  Social History Narrative   Not on file   Social Drivers of Health   Tobacco Use: Low Risk (12/02/2024)   Patient History    Smoking Tobacco Use: Never    Smokeless Tobacco Use: Never    Passive Exposure:  Not on file  Financial Resource Strain: Low Risk (04/18/2024)   Overall Financial Resource Strain (CARDIA)    Difficulty of Paying Living Expenses: Not hard at all  Food Insecurity: No Food Insecurity (04/18/2024)   Hunger Vital Sign    Worried About Running Out of Food in the Last Year: Never true    Ran Out of Food in the Last Year: Never true  Transportation Needs: No Transportation Needs (04/18/2024)   PRAPARE - Administrator, Civil Service (Medical): No    Lack of Transportation (Non-Medical): No  Physical Activity: Unknown (04/18/2024)   Exercise Vital Sign    Days of Exercise per Week: 0 days    Minutes of Exercise per Session: Not on file  Stress: No  Stress Concern Present (04/18/2024)   [employer] of Occupational Health - Occupational Stress Questionnaire    Feeling of Stress : Only a little  Social Connections: Socially Integrated (04/18/2024)   Social Connection and Isolation Panel    Frequency of Communication with Friends and Family: Twice a week    Frequency of Social Gatherings with Friends and Family: Once a week    Attends Religious Services: 1 to 4 times per year    Active Member of Clubs or Organizations: Yes    Attends Banker Meetings: More than 4 times per year    Marital Status: Living with partner  Depression (PHQ2-9): Low Risk (04/19/2024)   Depression (PHQ2-9)    PHQ-2 Score: 0  Alcohol Screen: Low Risk (04/18/2024)   Alcohol Screen    Last Alcohol Screening Score (AUDIT): 1  Housing: Low Risk (04/18/2024)   Housing Stability Vital Sign    Unable to Pay for Housing in the Last Year: No    Number of Times Moved in the Last Year: 0    Homeless in the Last Year: No  Utilities: Not on file  Health Literacy: Not on file     Review of Systems  Constitutional:  Negative for unexpected weight change.       Some decrease in appetite since starting 2mg  ozempic . Eating. Monitoring carbs.   HENT:  Negative for congestion and sinus pressure.   Respiratory:  Negative for cough, chest tightness and shortness of breath.   Cardiovascular:  Negative for chest pain and palpitations.       Chronic swelling. Overall improved.   Gastrointestinal:  Negative for abdominal pain, diarrhea, nausea and vomiting.  Genitourinary:  Negative for difficulty urinating and dysuria.  Musculoskeletal:  Negative for joint swelling and myalgias.  Skin:  Negative for color change and rash.       Chronic lower extremity stasis changes.   Neurological:  Negative for dizziness and headaches.  Psychiatric/Behavioral:  Negative for agitation and dysphoric mood.        Objective:     BP 126/74   Pulse 96   Temp 97.9 F (36.6  C) (Oral)   Ht 6' 1 (1.854 m)   Wt 296 lb 9.6 oz (134.5 kg)   SpO2 94%   BMI 39.13 kg/m  Wt Readings from Last 3 Encounters:  11/27/24 296 lb 9.6 oz (134.5 kg)  08/24/24 (!) 303 lb (137.4 kg)  06/15/24 296 lb 6.4 oz (134.4 kg)    Physical Exam      Outpatient Encounter Medications as of 11/27/2024  Medication Sig   amLODipine  (NORVASC ) 5 MG tablet TAKE 1 TABLET(5 MG) BY MOUTH DAILY   cholecalciferol (VITAMIN D ) 1000 units tablet Take 1,000  Units by mouth daily.   Continuous Glucose Sensor (DEXCOM G7 SENSOR) MISC APPLY 1 SENSOR TO SKIN AS DIRECTED, CHANGE EVERY 10 DAYS   hydrocortisone  1 % ointment Apply 1 Application topically 2 (two) times daily.   lisinopril  (ZESTRIL ) 40 MG tablet TAKE 1 TABLET BY MOUTH DAILY   loratadine (CLARITIN) 10 MG tablet Take 10 mg by mouth daily as needed for allergies.    metFORMIN  (GLUCOPHAGE -XR) 500 MG 24 hr tablet Take 3 tablets by mouth one daily. Replaces regular metformin .   rosuvastatin  (CRESTOR ) 10 MG tablet TAKE 1 TABLET BY MOUTH DAILY   Semaglutide , 2 MG/DOSE, 8 MG/3ML SOPN Inject 2 mg as directed once a week.   triamterene -hydrochlorothiazide  (MAXZIDE-25) 37.5-25 MG tablet Take 1 tablet by mouth daily.   Vitamin D , Ergocalciferol , (DRISDOL ) 1.25 MG (50000 UNIT) CAPS capsule Take 1 capsule (50,000 Units total) by mouth every 7 (seven) days.   [DISCONTINUED] amLODipine  (NORVASC ) 5 MG tablet TAKE 1 TABLET(5 MG) BY MOUTH DAILY   No facility-administered encounter medications on file as of 11/27/2024.     Lab Results  Component Value Date   WBC 7.4 08/22/2024   HGB 13.7 08/22/2024   HCT 42.3 08/22/2024   PLT 332 08/22/2024   GLUCOSE 184 (H) 08/22/2024   CHOL 133 08/22/2024   TRIG 98 08/22/2024   HDL 38 (L) 08/22/2024   LDLCALC 77 08/22/2024   ALT 17 08/22/2024   AST 12 08/22/2024   NA 139 08/22/2024   K 3.4 (L) 08/22/2024   CL 97 08/22/2024   CREATININE 0.76 08/22/2024   BUN 14 08/22/2024   CO2 26 08/22/2024   TSH 1.010 08/22/2024    HGBA1C 7.7 (H) 08/22/2024    DG Abd 1 View Result Date: 06/09/2023 CLINICAL DATA:  Abdominal pain EXAM: ABDOMEN - 1 VIEW COMPARISON:  None Available. FINDINGS: Bowel gas pattern is nonspecific. Moderate to large amount of stool is seen in colon, mostly in the ascending colon. There is no small bowel dilation. Surgical clips are seen in gallbladder fossa. Degenerative changes are noted in thoracic and lumbar spine. No abnormal masses or calcifications are seen. Kidneys are partly obscured by bowel contents. IMPRESSION: Nonspecific bowel gas pattern. Moderate to large stool burden in colon, more so in ascending colon. Electronically Signed   By: Gearldine Mary M.D.   On: 06/09/2023 14:41   DG Lumbar Spine 2-3 Views Result Date: 06/09/2023 CLINICAL DATA:  Back pain, no recent trauma. EXAM: LUMBAR SPINE - 2-3 VIEW COMPARISON:  None Available. FINDINGS: There is no evidence of lumbar spine fracture. Mild anterior vertebral body height loss suggesting mild compression deformity of L1. Straightening of the lumbar spine. Mild multilevel degenerate disc disease. IMPRESSION: Mild anterior vertebral body height loss suggesting mild compression deformity of L1, age indeterminate. 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 Electronically Signed   By: Imran  Ahmed D.O.   On: 06/09/2023 14:38   DG Thoracic Spine 2 View Result Date: 06/09/2023 CLINICAL DATA:  Increased back pain.  No injury. EXAM: THORACIC SPINE 2 VIEWS COMPARISON:  None Available. FINDINGS: There is no evidence of thoracic spine fracture. Alignment is normal. Mild multilevel degenerate disc disease with disc height loss and marginal spurring. IMPRESSION: Mild multilevel degenerative disc disease. Electronically Signed   By: Imran  Ahmed D.O.   On: 06/09/2023 14:35       Assessment & Plan:  Sleep apnea, unspecified type Assessment & Plan: Continues using cpap nightly and  benefiting from use. Had f/u with pulmonary 03/2024. Doing well.    Vitamin D  deficiency Assessment & Plan: Check vitamin D  level with next labs. Will go by [facility] to have labs drawn.   Orders: -     VITAMIN D  25 Hydroxy (Vit-D Deficiency, Fractures)  Hypercholesterolemia Assessment & Plan: Continue crestor . Discussed diet and exercise. Follow lipid panel.  Lab Results  Component Value Date   CHOL 133 08/22/2024   HDL 38 (L) 08/22/2024   LDLCALC 77 08/22/2024   TRIG 98 08/22/2024   CHOLHDL 3.5 08/22/2024     Orders: -     Lipid panel -     Hepatic function panel  Type 2 diabetes mellitus with hyperglycemia, without long-term current use of insulin (HCC) Assessment & Plan: Currently on Ozempic  and metformin .  Blood sugars as outlined.  Have discussed diet and exercise.  Tolerating ozempic  on 2mg  weekly. Low carb diet. Follow met b and A1c. Due labs.   Orders: -     Basic metabolic panel with GFR -     Hemoglobin A1c  Anemia, unspecified type Assessment & Plan: Follow cbc.    Secondary diabetes with peripheral circulatory disorder [facility]) Assessment & Plan: Low carb diet and exercise.  On ozempic  and metformin .  Increase metformin  as directed. Follow met b and a1c.  Lower extremity swelling improved. Continue compression hose.        History of colonic polyps Assessment & Plan: Colonoscopy 12/12/20 - polyp hepatic flexure, polyp - rectum and internal hemorrhoids. Recommended f/u in 7 years.     Essential hypertension Assessment & Plan: Continue lisinopril  and hydrochlorothiazide  and amlodipine .  Pressure as outlined.  No changes to medication today.  Follow pressures. Follow metabolic panel.       Allena Hamilton, MD "

## 2024-12-02 ENCOUNTER — Encounter: Payer: Self-pay | Admitting: Internal Medicine

## 2024-12-02 NOTE — Assessment & Plan Note (Signed)
 Low carb diet and exercise.  On ozempic  and metformin .  Increase metformin  as directed. Follow met b and a1c.  Lower extremity swelling improved. Continue compression hose.

## 2024-12-02 NOTE — Assessment & Plan Note (Signed)
 Follow cbc.

## 2024-12-02 NOTE — Assessment & Plan Note (Signed)
 Continue lisinopril  and hydrochlorothiazide  and amlodipine .  Pressure as outlined.  No changes to medication today.  Follow pressures. Follow metabolic panel.

## 2024-12-02 NOTE — Assessment & Plan Note (Signed)
 Continue crestor . Discussed diet and exercise. Follow lipid panel.  Lab Results  Component Value Date   CHOL 133 08/22/2024   HDL 38 (L) 08/22/2024   LDLCALC 77 08/22/2024   TRIG 98 08/22/2024   CHOLHDL 3.5 08/22/2024

## 2024-12-02 NOTE — Assessment & Plan Note (Signed)
 Currently on Ozempic  and metformin .  Blood sugars as outlined.  Have discussed diet and exercise.  Tolerating ozempic  on 2mg  weekly. Low carb diet. Follow met b and A1c. Due labs.

## 2024-12-02 NOTE — Assessment & Plan Note (Signed)
 Check vitamin D  level with next labs. Will go by [facility] to have labs drawn.

## 2024-12-02 NOTE — Assessment & Plan Note (Signed)
Colonoscopy 12/12/20 - polyp hepatic flexure, polyp - rectum and internal hemorrhoids. Recommended f/u in 7 years.   

## 2024-12-02 NOTE — Assessment & Plan Note (Signed)
 Continues using cpap nightly and benefiting from use. Had f/u with pulmonary 03/2024. Doing well.

## 2025-02-08 ENCOUNTER — Ambulatory Visit: Admitting: Podiatry

## 2025-02-26 ENCOUNTER — Ambulatory Visit: Admitting: Internal Medicine
